# Patient Record
Sex: Female | Born: 1955
Health system: Southern US, Community
[De-identification: ages and names within clinical notes are randomized; demographics above are authoritative.]

## PROBLEM LIST (undated history)

## (undated) DIAGNOSIS — F4310 Post-traumatic stress disorder, unspecified: Secondary | ICD-10-CM

## (undated) DIAGNOSIS — Z8742 Personal history of other diseases of the female genital tract: Secondary | ICD-10-CM

## (undated) DIAGNOSIS — C801 Malignant (primary) neoplasm, unspecified: Secondary | ICD-10-CM

## (undated) DIAGNOSIS — F32A Depression, unspecified: Secondary | ICD-10-CM

## (undated) DIAGNOSIS — T4145XA Adverse effect of unspecified anesthetic, initial encounter: Secondary | ICD-10-CM

## (undated) DIAGNOSIS — E079 Disorder of thyroid, unspecified: Secondary | ICD-10-CM

## (undated) DIAGNOSIS — IMO0001 Reserved for inherently not codable concepts without codable children: Secondary | ICD-10-CM

## (undated) DIAGNOSIS — T8859XA Other complications of anesthesia, initial encounter: Secondary | ICD-10-CM

## (undated) DIAGNOSIS — I10 Essential (primary) hypertension: Secondary | ICD-10-CM

## (undated) DIAGNOSIS — K219 Gastro-esophageal reflux disease without esophagitis: Secondary | ICD-10-CM

## (undated) DIAGNOSIS — M1990 Unspecified osteoarthritis, unspecified site: Secondary | ICD-10-CM

## (undated) DIAGNOSIS — N840 Polyp of corpus uteri: Secondary | ICD-10-CM

## (undated) DIAGNOSIS — E785 Hyperlipidemia, unspecified: Secondary | ICD-10-CM

## (undated) DIAGNOSIS — F419 Anxiety disorder, unspecified: Secondary | ICD-10-CM

## (undated) DIAGNOSIS — F329 Major depressive disorder, single episode, unspecified: Secondary | ICD-10-CM

## (undated) DIAGNOSIS — M8580 Other specified disorders of bone density and structure, unspecified site: Secondary | ICD-10-CM

## (undated) HISTORY — DX: Post-traumatic stress disorder, unspecified: F43.10

## (undated) HISTORY — PX: COLONOSCOPY: SHX174

## (undated) HISTORY — DX: Major depressive disorder, single episode, unspecified: F32.9

## (undated) HISTORY — DX: Malignant (primary) neoplasm, unspecified: C80.1

## (undated) HISTORY — DX: Disorder of thyroid, unspecified: E07.9

## (undated) HISTORY — DX: Depression, unspecified: F32.A

## (undated) HISTORY — PX: TUBAL LIGATION: SHX77

## (undated) HISTORY — DX: Anxiety disorder, unspecified: F41.9

## (undated) HISTORY — DX: Essential (primary) hypertension: I10

## (undated) HISTORY — DX: Other specified disorders of bone density and structure, unspecified site: M85.80

## (undated) HISTORY — DX: Polyp of corpus uteri: N84.0

## (undated) HISTORY — DX: Unspecified osteoarthritis, unspecified site: M19.90

## (undated) HISTORY — DX: Personal history of other diseases of the female genital tract: Z87.42

---

## 1998-04-03 ENCOUNTER — Other Ambulatory Visit: Admission: RE | Admit: 1998-04-03 | Discharge: 1998-04-03 | Payer: Self-pay | Admitting: Obstetrics and Gynecology

## 1998-06-03 HISTORY — PX: THYROIDECTOMY: SHX17

## 1999-02-15 ENCOUNTER — Ambulatory Visit (HOSPITAL_COMMUNITY): Admission: RE | Admit: 1999-02-15 | Discharge: 1999-02-15 | Payer: Self-pay

## 2000-01-28 ENCOUNTER — Other Ambulatory Visit: Admission: RE | Admit: 2000-01-28 | Discharge: 2000-01-28 | Payer: Self-pay | Admitting: Obstetrics and Gynecology

## 2001-07-23 ENCOUNTER — Other Ambulatory Visit: Admission: RE | Admit: 2001-07-23 | Discharge: 2001-07-23 | Payer: Self-pay | Admitting: Obstetrics and Gynecology

## 2001-10-09 ENCOUNTER — Emergency Department (HOSPITAL_COMMUNITY): Admission: EM | Admit: 2001-10-09 | Discharge: 2001-10-09 | Payer: Self-pay | Admitting: Emergency Medicine

## 2001-10-09 ENCOUNTER — Encounter: Payer: Self-pay | Admitting: Emergency Medicine

## 2003-05-31 ENCOUNTER — Other Ambulatory Visit: Admission: RE | Admit: 2003-05-31 | Discharge: 2003-05-31 | Payer: Self-pay | Admitting: Obstetrics and Gynecology

## 2003-06-16 ENCOUNTER — Encounter: Admission: RE | Admit: 2003-06-16 | Discharge: 2003-06-16 | Payer: Self-pay | Admitting: Endocrinology

## 2003-10-31 ENCOUNTER — Inpatient Hospital Stay (HOSPITAL_COMMUNITY): Admission: AD | Admit: 2003-10-31 | Discharge: 2003-11-03 | Payer: Self-pay | Admitting: Psychiatry

## 2003-10-31 ENCOUNTER — Emergency Department (HOSPITAL_COMMUNITY): Admission: EM | Admit: 2003-10-31 | Discharge: 2003-10-31 | Payer: Self-pay | Admitting: Emergency Medicine

## 2004-06-01 ENCOUNTER — Other Ambulatory Visit: Admission: RE | Admit: 2004-06-01 | Discharge: 2004-06-01 | Payer: Self-pay | Admitting: Obstetrics and Gynecology

## 2005-06-12 ENCOUNTER — Other Ambulatory Visit: Admission: RE | Admit: 2005-06-12 | Discharge: 2005-06-12 | Payer: Self-pay | Admitting: Obstetrics and Gynecology

## 2005-10-01 ENCOUNTER — Ambulatory Visit: Payer: Self-pay | Admitting: Internal Medicine

## 2005-11-14 ENCOUNTER — Ambulatory Visit: Payer: Self-pay | Admitting: Internal Medicine

## 2006-03-08 ENCOUNTER — Encounter: Admission: RE | Admit: 2006-03-08 | Discharge: 2006-03-08 | Payer: Self-pay | Admitting: Sports Medicine

## 2006-04-07 ENCOUNTER — Ambulatory Visit: Payer: Self-pay | Admitting: Internal Medicine

## 2006-07-17 ENCOUNTER — Encounter (INDEPENDENT_AMBULATORY_CARE_PROVIDER_SITE_OTHER): Payer: Self-pay | Admitting: Specialist

## 2006-07-17 ENCOUNTER — Ambulatory Visit (HOSPITAL_BASED_OUTPATIENT_CLINIC_OR_DEPARTMENT_OTHER): Admission: RE | Admit: 2006-07-17 | Discharge: 2006-07-17 | Payer: Self-pay | Admitting: Obstetrics and Gynecology

## 2006-07-17 HISTORY — PX: DIAGNOSTIC LAPAROSCOPY: SUR761

## 2006-07-23 ENCOUNTER — Other Ambulatory Visit: Admission: RE | Admit: 2006-07-23 | Discharge: 2006-07-23 | Payer: Self-pay | Admitting: Obstetrics and Gynecology

## 2007-01-14 ENCOUNTER — Observation Stay (HOSPITAL_COMMUNITY): Admission: EM | Admit: 2007-01-14 | Discharge: 2007-01-15 | Payer: Self-pay | Admitting: Emergency Medicine

## 2007-01-31 ENCOUNTER — Emergency Department (HOSPITAL_COMMUNITY): Admission: EM | Admit: 2007-01-31 | Discharge: 2007-01-31 | Payer: Self-pay | Admitting: Emergency Medicine

## 2007-02-03 ENCOUNTER — Ambulatory Visit (HOSPITAL_COMMUNITY): Admission: RE | Admit: 2007-02-03 | Discharge: 2007-02-03 | Payer: Self-pay | Admitting: Emergency Medicine

## 2007-02-07 ENCOUNTER — Emergency Department (HOSPITAL_COMMUNITY): Admission: EM | Admit: 2007-02-07 | Discharge: 2007-02-07 | Payer: Self-pay | Admitting: Emergency Medicine

## 2007-02-20 ENCOUNTER — Encounter (HOSPITAL_COMMUNITY): Admission: RE | Admit: 2007-02-20 | Discharge: 2007-05-06 | Payer: Self-pay | Admitting: Emergency Medicine

## 2008-02-24 ENCOUNTER — Ambulatory Visit: Payer: Self-pay | Admitting: Obstetrics and Gynecology

## 2008-02-25 ENCOUNTER — Ambulatory Visit: Payer: Self-pay | Admitting: Obstetrics and Gynecology

## 2008-03-17 ENCOUNTER — Other Ambulatory Visit: Admission: RE | Admit: 2008-03-17 | Discharge: 2008-03-17 | Payer: Self-pay | Admitting: Obstetrics and Gynecology

## 2008-03-17 ENCOUNTER — Ambulatory Visit: Payer: Self-pay | Admitting: Obstetrics and Gynecology

## 2008-03-17 ENCOUNTER — Encounter: Payer: Self-pay | Admitting: Obstetrics and Gynecology

## 2008-05-10 ENCOUNTER — Ambulatory Visit: Payer: Self-pay | Admitting: Obstetrics and Gynecology

## 2009-03-22 ENCOUNTER — Ambulatory Visit: Payer: Self-pay | Admitting: Obstetrics and Gynecology

## 2009-03-22 ENCOUNTER — Other Ambulatory Visit: Admission: RE | Admit: 2009-03-22 | Discharge: 2009-03-22 | Payer: Self-pay | Admitting: Obstetrics and Gynecology

## 2009-03-22 ENCOUNTER — Encounter: Payer: Self-pay | Admitting: Obstetrics and Gynecology

## 2009-04-11 ENCOUNTER — Ambulatory Visit: Payer: Self-pay | Admitting: Obstetrics and Gynecology

## 2009-06-14 ENCOUNTER — Ambulatory Visit: Payer: Self-pay | Admitting: Vascular Surgery

## 2009-06-14 ENCOUNTER — Encounter (INDEPENDENT_AMBULATORY_CARE_PROVIDER_SITE_OTHER): Payer: Self-pay | Admitting: Emergency Medicine

## 2009-06-14 ENCOUNTER — Emergency Department (HOSPITAL_COMMUNITY): Admission: EM | Admit: 2009-06-14 | Discharge: 2009-06-14 | Payer: Self-pay | Admitting: Emergency Medicine

## 2009-10-06 ENCOUNTER — Ambulatory Visit: Payer: Self-pay | Admitting: Obstetrics and Gynecology

## 2010-03-16 ENCOUNTER — Emergency Department (HOSPITAL_COMMUNITY): Admission: EM | Admit: 2010-03-16 | Discharge: 2010-03-16 | Payer: Self-pay | Admitting: Emergency Medicine

## 2010-04-30 ENCOUNTER — Ambulatory Visit: Payer: Self-pay | Admitting: Obstetrics and Gynecology

## 2010-04-30 ENCOUNTER — Other Ambulatory Visit: Admission: RE | Admit: 2010-04-30 | Discharge: 2010-04-30 | Payer: Self-pay | Admitting: Obstetrics and Gynecology

## 2010-05-10 ENCOUNTER — Ambulatory Visit: Payer: Self-pay | Admitting: Obstetrics and Gynecology

## 2010-08-06 ENCOUNTER — Ambulatory Visit (INDEPENDENT_AMBULATORY_CARE_PROVIDER_SITE_OTHER): Payer: PRIVATE HEALTH INSURANCE | Admitting: Obstetrics and Gynecology

## 2010-08-06 DIAGNOSIS — Z113 Encounter for screening for infections with a predominantly sexual mode of transmission: Secondary | ICD-10-CM

## 2010-08-06 DIAGNOSIS — Z7189 Other specified counseling: Secondary | ICD-10-CM

## 2010-08-16 ENCOUNTER — Emergency Department (HOSPITAL_COMMUNITY)
Admission: EM | Admit: 2010-08-16 | Discharge: 2010-08-16 | Disposition: A | Discharge status: Home / Self Care | Payer: PRIVATE HEALTH INSURANCE | Attending: Emergency Medicine | Admitting: Emergency Medicine

## 2010-08-16 ENCOUNTER — Emergency Department (HOSPITAL_COMMUNITY): Payer: PRIVATE HEALTH INSURANCE

## 2010-08-16 DIAGNOSIS — Z79899 Other long term (current) drug therapy: Secondary | ICD-10-CM | POA: Insufficient documentation

## 2010-08-16 DIAGNOSIS — E785 Hyperlipidemia, unspecified: Secondary | ICD-10-CM | POA: Insufficient documentation

## 2010-08-16 DIAGNOSIS — E119 Type 2 diabetes mellitus without complications: Secondary | ICD-10-CM | POA: Insufficient documentation

## 2010-08-16 DIAGNOSIS — R079 Chest pain, unspecified: Secondary | ICD-10-CM | POA: Insufficient documentation

## 2010-08-16 DIAGNOSIS — I10 Essential (primary) hypertension: Secondary | ICD-10-CM | POA: Insufficient documentation

## 2010-08-16 DIAGNOSIS — R112 Nausea with vomiting, unspecified: Secondary | ICD-10-CM | POA: Insufficient documentation

## 2010-08-16 DIAGNOSIS — Z7982 Long term (current) use of aspirin: Secondary | ICD-10-CM | POA: Insufficient documentation

## 2010-08-16 DIAGNOSIS — E039 Hypothyroidism, unspecified: Secondary | ICD-10-CM | POA: Insufficient documentation

## 2010-08-16 DIAGNOSIS — F341 Dysthymic disorder: Secondary | ICD-10-CM | POA: Insufficient documentation

## 2010-08-16 LAB — URINALYSIS, ROUTINE W REFLEX MICROSCOPIC
Ketones, ur: NEGATIVE mg/dL
Nitrite: NEGATIVE
Protein, ur: NEGATIVE mg/dL
Urobilinogen, UA: 0.2 mg/dL (ref 0.0–1.0)
pH: 7 (ref 5.0–8.0)

## 2010-08-16 LAB — DIFFERENTIAL
Basophils Absolute: 0.1 10*3/uL (ref 0.0–0.1)
Basophils Relative: 1 % (ref 0–1)
Eosinophils Relative: 2 % (ref 0–5)
Monocytes Absolute: 0.4 10*3/uL (ref 0.1–1.0)

## 2010-08-16 LAB — POCT I-STAT, CHEM 8
Calcium, Ion: 0.85 mmol/L — ABNORMAL LOW (ref 1.12–1.32)
Glucose, Bld: 301 mg/dL — ABNORMAL HIGH (ref 70–99)
HCT: 39 % (ref 36.0–46.0)
Hemoglobin: 13.3 g/dL (ref 12.0–15.0)

## 2010-08-16 LAB — CBC
HCT: 39.3 % (ref 36.0–46.0)
MCHC: 32.6 g/dL (ref 30.0–36.0)
RDW: 16.3 % — ABNORMAL HIGH (ref 11.5–15.5)

## 2010-08-16 LAB — POCT CARDIAC MARKERS: Myoglobin, poc: 95.9 ng/mL (ref 12–200)

## 2010-08-16 LAB — COMPREHENSIVE METABOLIC PANEL
ALT: 17 U/L (ref 0–35)
Alkaline Phosphatase: 110 U/L (ref 39–117)
CO2: 24 mEq/L (ref 19–32)
Calcium: 8.8 mg/dL (ref 8.4–10.5)
Chloride: 106 mEq/L (ref 96–112)
GFR calc non Af Amer: >60 mL/min (ref >60)
Glucose, Bld: 113 mg/dL — ABNORMAL HIGH (ref 70–99)
Sodium: 140 mEq/L (ref 135–145)
Total Bilirubin: 0.5 mg/dL (ref 0.3–1.2)

## 2010-08-16 LAB — GLUCOSE, CAPILLARY: Glucose-Capillary: 317 mg/dL — ABNORMAL HIGH (ref 70–99)

## 2010-08-17 LAB — URINE CULTURE
Colony Count: NO GROWTH
Culture  Setup Time: 201203151144
Culture: NO GROWTH

## 2010-09-20 ENCOUNTER — Institutional Professional Consult (permissible substitution): Payer: PRIVATE HEALTH INSURANCE | Admitting: Family Medicine

## 2010-09-25 ENCOUNTER — Other Ambulatory Visit (INDEPENDENT_AMBULATORY_CARE_PROVIDER_SITE_OTHER): Payer: BC Managed Care – PPO

## 2010-09-25 DIAGNOSIS — N912 Amenorrhea, unspecified: Secondary | ICD-10-CM

## 2010-10-16 NOTE — H&P (Signed)
NAME:  Virginia Romero, Virginia Romero NO.:  [id number]   MEDICAL RECORD NO.:  [id number]          PATIENT TYPE:  EMS   LOCATION:  MAJO                         FACILITY:  [facility]   PHYSICIAN:  Cassell Clement, M.D. DATE OF BIRTH:  02-15-1956   DATE OF ADMISSION:  01/14/2007  DATE OF DISCHARGE:                              HISTORY & PHYSICAL   CHIEF COMPLAINT:  Chest pain.   HISTORY OF PRESENT ILLNESS:  This is a 55 year old Caucasian female  admitted with chest pain from the emergency room at [facility].  She  does not have any history of known coronary artery disease.  She had her  first episode of chest discomfort about two weeks ago and at that time  she saw Dr. Dagoberto Ligas in the office who did an EKG which showed no acute  abnormalities and he advised her to increase her Toprol from 150 mg  twice a day to 200 mg twice a day.  She had been on Toprol for high  blood pressure.  She did well after that adjustment in the medicines  until today when preparing to go to work, she developed nausea and  diaphoresis followed by substernal chest pain.  She denies any dyspnea  associated with it but did feel very anxious.  She was able to drive  herself to the emergency room and upon arrival she was given four baby  aspirin and some sublingual nitroglycerin with relief of her pain.   Her initial EKG shows no acute changes of ischemia.  Her initial cardiac  enzymes are normal.  She has had no recurrence of pain while holding in  the emergency room.  She does give a history of occasional chest  tightness if she is climbing stairs or she is carrying heavy loads.  She  does have a past history of hypertension for about five years.   MEDICATIONS:  Her present medications include amlodipine 5 mg daily,  Toprol-XL 200 mg twice a day, Levoxyl 2 mg daily, a diuretic, unknown  type, 1-1/2 tablets a day, Xanax 0.25 mg at bedtime, and recently, two  weeks ago, Dr. Dagoberto Ligas also gave her a trial of  Prevacid 30 mg daily.   FAMILY HISTORY:  The family history reveals that her mother has had  coronary disease and had coronary bypass graft surgery three years ago.  Her maternal grandmother died at age 74 of heart problems and heart  failure.  The patient's father died in a car wreck.   SOCIAL HISTORY:  The social history reveals that the patient is married.  They have two children, ages 67 and 22.  The patient works at [employer].  She does smoke 12 cigarettes a day.  She does not drink  alcohol.   PAST MEDICAL HISTORY:  Her past medical history reveals that she had a  total thyroidectomy done at [facility] in 2000.   ALLERGIES:  She has no known drug allergies.   PAST SURGICAL HISTORY:  The past surgical history besides her  thyroidectomy includes a recent oophorectomy by Dr. Eda Paschal for a  large cyst on  one of her ovaries.  She has also had cesarean sections  times two.   REVIEW OF SYSTEMS:  The remainder of the review of systems is negative  in detail.   PHYSICAL EXAMINATION:  VITAL SIGNS:  On examination weight is 200, blood  pressure is 150/79, and pulse 63.  GENERAL APPEARANCE:  The general appearance reveals a well-developed,  well-nourished, somewhat overweight woman in no acute distress.  SKIN:  Warm and dry.  HEENT:  The pupils are equal and reactive.  There is no xanthelasma.  There is no icterus.  The mouth and pharynx are normal.  Jugular venous  pressure is normal.  Thyroid normal.  Carotids are normal.  CHEST:  The chest is clear.  HEART:  Quiet precordium without murmur, gallop, rub, or click.  ABDOMEN:  The abdomen is soft, nontender, no mass.  EXTREMITIES:  The extremities show no phlebitis or edema.  Pedal pulses  are present.   INVESTIGATIONS:  Her electrocardiogram is normal, normal sinus rhythm at  77, no acute changes.  Her initial CK-MB is less than 1.0, initial  troponin of 0.05.   IMPRESSION:  1. Chest pain, rule out myocardial infarction.  2.  Hypertensive cardiovascular disease.  3. Status post thyroidectomy, on replacement thyroid hormone.  4. Tobacco abuse.  5. Possible gastroesophageal reflux disease.   DISPOSITION:  We are going to admit for observation status.  Will get  serial enzymes.  We will place her on telemetry.  We will continue with  aspirin, beta blocker, Lovenox, and p.r.n. nitroglycerin.  If she  remains stable and with no evidence of acute cardiac injury will  consider outpatient Cardiolite stress testing.           ______________________________  Cassell Clement, M.D.     TB/MEDQ  D:  01/14/2007  T:  01/16/2007  Job:  [id number]   cc:   Cassell Clement, M.D.  Alfonse Alpers. Dagoberto Ligas, M.D.  Daniel L. Eda Paschal, M.D.

## 2010-10-16 NOTE — Discharge Summary (Signed)
NAME:  Virginia Romero, Virginia Romero                ACCOUNT NO.:  [id number]   MEDICAL RECORD NO.:  [id number]          PATIENT TYPE:  INP   LOCATION:  3701                         FACILITY:  [facility]   PHYSICIAN:  Cassell Clement, M.D. DATE OF BIRTH:  1955-08-24   DATE OF ADMISSION:  01/14/2007  DATE OF DISCHARGE:  01/15/2007                               DISCHARGE SUMMARY   FINAL DIAGNOSES:  1. Chest pain, myocardial infarction ruled out.  2. Hyperlipidemia.  3. Essential hypertension.  4. Status post thyroidectomy, on replacement therapy.  5. Tobacco abuse.  6. Possible gastroesophageal reflux disease.   HISTORY:  This 55 year old woman was admitted from the Emergency Room  with chest pain.  She had had one episode several weeks ago and, at that  time, had seen Dr. Dagoberto Ligas, who did an EKG and advised her to increase  her Toprol.  She did well until today when, while preparing to go to  work, she again had chest pain and was advised to come to the Emergency  Room.  With his pain, she did have some diaphoresis and nausea but no  dyspnea.  She drove herself to the Emergency Room.  She was given four  aspirin and one sublingual nitroglycerin with relief of pain.  She had  no further chest pain in the Emergency Room.   Her home medications include Xanax, diuretic, Levoxyl, amlodipine,  Toprol, and Prevacid.   PHYSICAL EXAMINATION:  GENERAL:  Physical examination in the Emergency  Room was unremarkable.  VITAL SIGNS:  Her blood pressure is 153/79 and pulse of 63.  Weight is  in excess of 200 pounds.  LUNGS:  Lungs are clear.  CARDIOVASCULAR:  The heart reveals no gallop.  ABDOMEN:  Negative.  EXTREMITIES:  Negative.   Her chest x-ray was normal.   Her EKG was normal.   Her cardiac enzymes in the Emergency Room were normal.   HOSPITAL COURSE:  She was admitted for observation.  She was placed on  telemetry.  Serial enzymes were obtained and were negative for  myocardial damage.  Repeat EKG  the following morning was again normal,  with no evidence of ischemia.  She had no further chest pain, and it was  felt that she could be reasonably discharged and have an outpatient  Cardiolite.  Her LDL was found to be high at 133, and her HDL was low at  22.  We are going to add statin therapy and we will choose simvastatin  because of cost consideration.   The patient is being discharged on the following regimen:  1. Simvastatin 40 mg each night.  2. Lisinopril 10 mg each morning.  3. Norvasc 5 mg daily.  4. Toprol-XL 100 mg twice a day.  5. Levoxyl 200 mg daily.  6. Aspirin 81 mg daily.  7. Pravastatin as needed.  8. She will continue her diuretic 1 daily.  9. She will have Nitrostat 1/150 on hand for as-needed use.   She is advised to stop smoking.  Our office will contact her to set up a  two-day treadmill Cardiolite stress test.  CONDITION ON DISCHARGE:  Improved.           ______________________________  Cassell Clement, M.D.     TB/MEDQ  D:  01/15/2007  T:  01/16/2007  Job:  [id number]   cc:   Alfonse Alpers. Dagoberto Ligas, M.D.  Daniel L. Eda Paschal, M.D.

## 2010-10-19 NOTE — Op Note (Signed)
NAME:  Virginia Romero, Virginia Romero                ACCOUNT NO.:  [id number]   MEDICAL RECORD NO.:  [id number]          PATIENT TYPE:  AMB   LOCATION:  NESC                         FACILITY:  [facility]   PHYSICIAN:  Daniel L. Gottsegen, M.D.DATE OF BIRTH:  1955/07/17   DATE OF PROCEDURE:  07/17/2006  DATE OF DISCHARGE:                               OPERATIVE REPORT   PREOPERATIVE DIAGNOSES:  1. Left lower quadrant pain.  2. Persistent left ovarian cyst.  3. New onset of right ovarian cyst.  4. Endometrial polyp.   POSTOPERATIVE DIAGNOSES:  1. Bilateral ovarian cysts.  2. Pelvic adhesive disease involving left adnexa.  3. Adhesions involving omentum and left lower quadrant.  4. Endometrial polyp.   OPERATION:  Diagnostic laparoscopy with lysis of peritubular and  periovarian adhesions, lysis of omental adhesions, left salpingo-  oophorectomy, right ovarian cystectomy, hysteroscopy with excision of  endometrial polyp.   INDICATIONS:  The patient is a 55 year old female with a 5-month history  of chronic left lower quadrant pain.  On ultrasound she had a very large  left ovarian cyst.  It has been watch on ultrasound and it has continued  to decrease in size.  It remains complex, however, it has never  completely gone away and even though it is decreased in size, her pain  on the left side has not gone away at all.  Her most recent ultrasound  also showed a new cyst on her right ovary.  She also has an endometrial  polyp seen on ultrasound, although she has had no menorrhagia.  She now  enters the hospital for evaluation of her left lower quadrant pain.  She  will undergo a left salpingo-oophorectomy if appropriate as well as  other appropriate surgery.  I have told her if we need to remove the  cyst on her right ovary, we will do so as well.  We will then do a  hysteroscopy and remove her endometrial polyp.   FINDINGS:  At the time of surgery, the patient had some omental  adhesions to the  parietal peritoneum on the left side which I think at  least partially explained her pain.  The left ovary and tube were  densely adherent, both posteriorly to the broad ligament and to the  uterus.  It gave an appearance of endometriosis, although no actual  endometriosis could be seen.  There was a cyst on the left ovary that  was part of the left ovarian mass.  On the patient's right side she had  an ovarian cyst that looked functional that was on one pole of the right  ovary.  Other than this, there were no other abnormal findings in the  pelvis, although she did have some adhesions at the site of her previous  cesarean section.  At the time of hysteroscopy, the patient had a very  distinct endometrial polyp coming off the anterior wall of the fundus of  about 1.5 cm.   PROCEDURE:  After adequate general orotracheal anesthesia, the patient  was placed in the dorsal lithotomy position, prepped and draped in the  usual sterile  manner.  A Hulka catheter was inserted in the patient's  uterus.  A Foley catheter inserted into the bladder, using the OptiVu of  which was placed subumbilically, the peritoneal cavity was entered by  direct puncture without any trauma. Two 5 mm ports were placed in the  pelvis.  The laparoscope in the subumbilical position was the diagnostic  scope and it was attached to a camera for magnification.  First  adhesions were taken down with the unipolar scissors.  The ovary and  tube were freed up as well as the omental adhesions and then it was  decided, because of the dense adhesive disease, to remove the left ovary  and tube.  It almost gave the appearance as if the patient had  endometriosis, although I did not really actually see any true  endometriosis.  The ureter was identified.  The IP ligament was  bipolared and cut.  The rest of the attachments of the left ovary and  tube were bipolar ed and cut and the specimen was now free.  Attention  was turned to  the cyst on the right ovary.  It was sharply dissected  free with a scissors using unipolar current.  There was a little  bleeding from the base when it was freed up from the ovary and this was  taken care of with bipolar and at the termination of procedure, there  was absolutely no bleeding.  A 5 mm scope was then placed in the right  lower quadrant.  An Endopouch was placed subumbilically and the left  ovary and tube and the right ovarian cyst were removed in this fashion  and they were sent to pathology separately.  The subumbilical incision  was closed with interrupteds of 0 Vicryl and the skin incisions were  closed with Dermabond.  Attention was next turned hysteroscopically.  A  single-tooth sac was placed in the anterior lip of the cervix.  The  cervix was dilated to #[facility] dilator.  The hysteroscopic resectoscope  was introduced, 3% sorbitol was used to expand the intrauterine cavity.  A camera once again was used for magnification and using a 90 degree  wire loop with appropriate Bovie settings, the endometrial polyp was  completely removed and sent to pathology for tissue diagnosis along with  endometrial curettings that were obtained afterwards.  The site was  inspected.  There was no bleeding.  Fluid deficit was minimal.  The  patient tolerated the procedure well and left the operating room in  satisfactory condition, draining clear urine from her Foley catheter  which was then removed.      Daniel L. Eda Paschal, M.D.  Electronically Signed     DLG/MEDQ  D:  07/17/2006  T:  07/17/2006  Job:  [id number]
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
## 2010-11-30 ENCOUNTER — Ambulatory Visit (INDEPENDENT_AMBULATORY_CARE_PROVIDER_SITE_OTHER): Payer: BC Managed Care – PPO | Admitting: Women's Health

## 2010-11-30 DIAGNOSIS — R82998 Other abnormal findings in urine: Secondary | ICD-10-CM

## 2010-11-30 DIAGNOSIS — Z113 Encounter for screening for infections with a predominantly sexual mode of transmission: Secondary | ICD-10-CM

## 2010-11-30 DIAGNOSIS — B3731 Acute candidiasis of vulva and vagina: Secondary | ICD-10-CM

## 2010-11-30 DIAGNOSIS — B373 Candidiasis of vulva and vagina: Secondary | ICD-10-CM

## 2010-11-30 DIAGNOSIS — R350 Frequency of micturition: Secondary | ICD-10-CM

## 2010-11-30 DIAGNOSIS — N949 Unspecified condition associated with female genital organs and menstrual cycle: Secondary | ICD-10-CM

## 2010-12-17 ENCOUNTER — Ambulatory Visit (INDEPENDENT_AMBULATORY_CARE_PROVIDER_SITE_OTHER): Payer: BC Managed Care – PPO | Admitting: Women's Health

## 2010-12-17 ENCOUNTER — Ambulatory Visit: Payer: BC Managed Care – PPO | Admitting: Women's Health

## 2010-12-17 ENCOUNTER — Other Ambulatory Visit: Payer: BC Managed Care – PPO

## 2010-12-17 DIAGNOSIS — N8000 Endometriosis of the uterus, unspecified: Secondary | ICD-10-CM

## 2010-12-17 DIAGNOSIS — N8300 Follicular cyst of ovary, unspecified side: Secondary | ICD-10-CM

## 2010-12-17 DIAGNOSIS — N949 Unspecified condition associated with female genital organs and menstrual cycle: Secondary | ICD-10-CM

## 2011-01-21 ENCOUNTER — Ambulatory Visit (INDEPENDENT_AMBULATORY_CARE_PROVIDER_SITE_OTHER): Payer: BC Managed Care – PPO | Admitting: Women's Health

## 2011-01-21 DIAGNOSIS — N912 Amenorrhea, unspecified: Secondary | ICD-10-CM

## 2011-03-18 LAB — DIFFERENTIAL
Basophils Absolute: 0.1
Basophils Relative: 1
Eosinophils Relative: 4
Lymphocytes Relative: 22
Monocytes Absolute: 0.6

## 2011-03-18 LAB — CBC
Hemoglobin: 11.7 — ABNORMAL LOW
Hemoglobin: 12.3
MCHC: 33.6
MCHC: 33.9
Platelets: 321
RBC: 4.16
RBC: 4.37
RDW: 15.6 — ABNORMAL HIGH
RDW: 15.6 — ABNORMAL HIGH
RDW: 15.9 — ABNORMAL HIGH

## 2011-03-18 LAB — LIPID PANEL
LDL Cholesterol: 133 — ABNORMAL HIGH
Total CHOL/HDL Ratio: 8.7
Triglycerides: 187 — ABNORMAL HIGH
VLDL: 37

## 2011-03-18 LAB — I-STAT 8, (EC8 V) (CONVERTED LAB)
BUN: 8
Glucose, Bld: 100 — ABNORMAL HIGH
HCT: 39
Hemoglobin: 13.3
Operator id: 285491
Potassium: 3.7
Sodium: 137

## 2011-03-18 LAB — PROTIME-INR
INR: 1
Prothrombin Time: 13

## 2011-03-18 LAB — CK TOTAL AND CKMB (NOT AT ARMC)
CK, MB: 1
Relative Index: INVALID
Total CK: 76

## 2011-03-18 LAB — APTT: aPTT: 29

## 2011-03-18 LAB — TSH: TSH: 1.496

## 2011-03-18 LAB — POCT CARDIAC MARKERS: Operator id: 285491

## 2011-03-18 LAB — TROPONIN I: Troponin I: 0.01

## 2011-05-20 ENCOUNTER — Ambulatory Visit: Payer: Self-pay | Admitting: Internal Medicine

## 2011-06-13 ENCOUNTER — Telehealth: Payer: Self-pay | Admitting: *Deleted

## 2011-06-13 NOTE — Telephone Encounter (Signed)
Patient called for uti meds.  Informed patient needs ov.

## 2011-06-17 ENCOUNTER — Ambulatory Visit: Payer: Self-pay | Admitting: Internal Medicine

## 2011-06-27 ENCOUNTER — Encounter: Payer: Self-pay | Admitting: Women's Health

## 2011-06-27 ENCOUNTER — Ambulatory Visit (INDEPENDENT_AMBULATORY_CARE_PROVIDER_SITE_OTHER): Payer: BC Managed Care – PPO | Admitting: Women's Health

## 2011-06-27 ENCOUNTER — Other Ambulatory Visit (HOSPITAL_COMMUNITY)
Admission: RE | Admit: 2011-06-27 | Discharge: 2011-06-27 | Disposition: A | Discharge status: Home / Self Care | Payer: BC Managed Care – PPO | Source: Ambulatory Visit | Attending: Obstetrics and Gynecology | Admitting: Obstetrics and Gynecology

## 2011-06-27 ENCOUNTER — Other Ambulatory Visit: Payer: Self-pay | Admitting: Women's Health

## 2011-06-27 VITALS — BP 148/86 | Ht 62.25 in

## 2011-06-27 DIAGNOSIS — Z01419 Encounter for gynecological examination (general) (routine) without abnormal findings: Secondary | ICD-10-CM | POA: Insufficient documentation

## 2011-06-27 DIAGNOSIS — R350 Frequency of micturition: Secondary | ICD-10-CM

## 2011-06-27 DIAGNOSIS — IMO0001 Reserved for inherently not codable concepts without codable children: Secondary | ICD-10-CM

## 2011-06-27 LAB — URINALYSIS, MICROSCOPIC ONLY: Crystals: NONE SEEN

## 2011-06-27 LAB — URINALYSIS, ROUTINE W REFLEX MICROSCOPIC
Leukocytes, UA: NEGATIVE
Nitrite: POSITIVE — AB
Specific Gravity, Urine: 1.02 (ref 1.005–1.030)
Urobilinogen, UA: 0.2 mg/dL (ref 0.0–1.0)

## 2011-06-27 MED ORDER — CIPROFLOXACIN HCL 500 MG PO TABS: 500.0000 mg | ORAL_TABLET | Freq: Two times a day (BID) | ORAL | Status: AC

## 2011-06-27 NOTE — Progress Notes (Signed)
Addended by: Venora Maples on: 06/27/2011 02:20 PM   Modules accepted: Orders

## 2011-06-27 NOTE — Progress Notes (Signed)
Addended by: Venora Maples on: 06/27/2011 02:29 PM   Modules accepted: Orders
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
## 2011-06-30 LAB — URINE CULTURE

## 2011-07-01 ENCOUNTER — Telehealth: Payer: Self-pay | Admitting: Women's Health

## 2011-07-01 ENCOUNTER — Telehealth: Payer: Self-pay | Admitting: *Deleted

## 2011-07-01 NOTE — Telephone Encounter (Signed)
Done, note written

## 2011-07-01 NOTE — Telephone Encounter (Signed)
Telephone call for followup, states is still having some frequency and is feeling rundown. Did review urine culture was positive and needs to finish Cipro prescription. Instructed to call if not 100% by the end of the week.

## 2011-07-01 NOTE — Telephone Encounter (Signed)
Pt called back and said that she would like for you to write a note for her to leave work today due to not feeling better from UTI. You spoke with pt this am. Please write note and I will fax to pt job.

## 2011-07-01 NOTE — Telephone Encounter (Signed)
Note faxed to [phone] per pt request.

## 2011-07-15 ENCOUNTER — Ambulatory Visit: Payer: Self-pay | Admitting: Internal Medicine

## 2011-07-26 ENCOUNTER — Other Ambulatory Visit: Payer: Self-pay

## 2011-07-26 NOTE — Telephone Encounter (Signed)
Called in xanax to [facility]. LMOM to cb to advise pt RTC for additional refills. She missed last two appt with Dr. Perrin Maltese

## 2011-08-12 ENCOUNTER — Ambulatory Visit: Payer: Self-pay | Admitting: Internal Medicine

## 2011-08-26 ENCOUNTER — Emergency Department (HOSPITAL_COMMUNITY)
Admission: EM | Admit: 2011-08-26 | Discharge: 2011-08-26 | Disposition: A | Discharge status: Home / Self Care | Payer: BC Managed Care – PPO | Attending: Emergency Medicine | Admitting: Emergency Medicine

## 2011-08-26 ENCOUNTER — Encounter (HOSPITAL_COMMUNITY): Payer: Self-pay | Admitting: Emergency Medicine

## 2011-08-26 DIAGNOSIS — Z85850 Personal history of malignant neoplasm of thyroid: Secondary | ICD-10-CM | POA: Insufficient documentation

## 2011-08-26 DIAGNOSIS — I10 Essential (primary) hypertension: Secondary | ICD-10-CM | POA: Insufficient documentation

## 2011-08-26 DIAGNOSIS — E119 Type 2 diabetes mellitus without complications: Secondary | ICD-10-CM | POA: Insufficient documentation

## 2011-08-26 DIAGNOSIS — R51 Headache: Secondary | ICD-10-CM | POA: Insufficient documentation

## 2011-08-26 DIAGNOSIS — J069 Acute upper respiratory infection, unspecified: Secondary | ICD-10-CM

## 2011-08-26 DIAGNOSIS — F17200 Nicotine dependence, unspecified, uncomplicated: Secondary | ICD-10-CM | POA: Insufficient documentation

## 2011-08-26 MED ORDER — PROMETHAZINE-DM 6.25-15 MG/5ML PO SYRP: 5.0000 mL | ORAL_SOLUTION | Freq: Four times a day (QID) | ORAL | Status: AC | PRN

## 2011-08-26 NOTE — ED Notes (Signed)
Pt here c/o HA worse with cough x 1 week with productive cough with green sputum

## 2011-08-26 NOTE — ED Provider Notes (Signed)
History     CSN: [id number]  Arrival date & time 08/26/11  0904   First MD Initiated Contact with Patient 08/26/11 [phone]      Chief Complaint  Patient presents with  . Headache  . URI    (Consider location/radiation/quality/duration/timing/severity/associated sxs/prior treatment) HPI Ms. Grussing is a 56 yo female with a history of HTN and DM2, who presents to the ED today with complaints of HA, fever, chills, productive cough, and congestion x 4 days.  Pt states this started last Thursday and has been persistent.  Headache is over the front and middle part of her head and is a "sharp pain."  Nothing makes it better or worse.  She has been coughing up copious amounts of green mucous and this has also been draining from her nose.  She has tried theraflu without any relief.  She denies any N/V/D, abdominal pain, SOB or chest pain.  She does not have a HA at this moment.  Past Medical History  Diagnosis Date  . History of ovarian cyst   . Endometrial polyp   . Hypertension   . Cancer     THYROID  . Diabetes mellitus   . Depression     Past Surgical History  Procedure Date  . Cesarean section '79 AND '86    X2  . Thyroidectomy 2000  . Tubal ligation   . Diagnostic laparoscopy 07/17/2006    WITH LYSIS OF PERITUBULAR AND PERIOVARIAN ADHESIONS, LYSIS OF OMENTAL ADHESIONS, LSO, RIGHT  OVARIAN CYSTECTOMY , HYSTEROSCOPY  WITH EXCISION OF ENDOMETRIAL POLYP...    Family History  Problem Relation Age of Onset  . Hypertension Mother   . Heart disease Mother   . Diabetes Brother   . Hypertension Maternal Grandmother   . Heart disease Maternal Grandmother   . Diabetes Maternal Grandfather   . Diabetes Paternal Grandfather     History  Substance Use Topics  . Smoking status: Current Some Day Smoker  . Smokeless tobacco: Never Used  . Alcohol Use: No    OB History    Grav Para Term Preterm Abortions TAB SAB Ect Mult Living   2 2        2       Review of Systems All pertinent  positives and negatives reviewed in the history of present illness  Allergies  Review of patient's allergies indicates no known allergies.  Home Medications   Current Outpatient Rx  Name Route Sig Dispense Refill  . ALPRAZOLAM 0.25 MG PO TABS Oral Take 0.5 mg by mouth at bedtime as needed. For sleep    . LEVOTHYROXINE SODIUM 100 MCG PO TABS Oral Take 100 mcg by mouth daily.    Marland Kitchen METFORMIN HCL 500 MG PO TABS Oral Take 500 mg by mouth 2 (two) times daily with a meal.    . METOPROLOL TARTRATE 100 MG PO TABS Oral Take 100 mg by mouth 2 (two) times daily.      BP 148/77  Pulse 73  Temp(Src) 98.3 F (36.8 C) (Oral)  Resp 18  SpO2 97%  Physical Exam  Constitutional: She is oriented to person, place, and time. She appears well-developed and well-nourished. No distress.  HENT:  Head: Normocephalic and atraumatic.  Right Ear: Hearing, tympanic membrane, external ear and ear canal normal.  Left Ear: Hearing, tympanic membrane, external ear and ear canal normal.  Nose: Nose normal.  Mouth/Throat: Oropharynx is clear and moist. No oropharyngeal exudate.  Eyes: Pupils are equal, round, and reactive to light.  Cardiovascular: Normal rate, regular rhythm, normal heart sounds and intact distal pulses.   No murmur heard. Pulmonary/Chest: Effort normal and breath sounds normal. No respiratory distress. She has no wheezes.  Abdominal: Soft. Bowel sounds are normal. She exhibits no distension. There is no tenderness. There is no rebound.  Neurological: She is alert and oriented to person, place, and time.  Skin: Skin is warm. No rash noted. She is not diaphoretic. No erythema.    ED Course  Procedures (including critical care time)  Labs Reviewed - No data to display No results found.  Pt sen and assessed.  Pt does not want a CXR due to monetary reasons and is just requesting "something for the cough" as "mucinex is too expensive."   MDM  The patient has normal vital signs in the emergency  department.  She advised me that she does not want any further testing as wants a note from with her.  I advised her that without any further testing we cannot definitively diagnose but that we feel she has a viral URI based on her history of present illness and physical exam findings.        Carlyle Dolly, PA-C 08/26/11 [phone]

## 2011-08-26 NOTE — Discharge Instructions (Signed)
You will need to return here as needed for any worsening in your condition. You need to use over the counter decongestant.

## 2011-08-29 NOTE — ED Provider Notes (Signed)
Medical screening examination/treatment/procedure(s) were performed by non-physician practitioner and as supervising physician I was immediately available for consultation/collaboration.   Suzi Roots, MD 08/29/11 1726

## 2011-09-10 ENCOUNTER — Telehealth: Payer: Self-pay

## 2011-09-10 NOTE — Telephone Encounter (Signed)
Pt needs ov before more refills are given.

## 2011-09-10 NOTE — Telephone Encounter (Signed)
Please pull chart and send message to pa pool. Thanks

## 2011-09-10 NOTE — Telephone Encounter (Signed)
Pt is calling in she states its been 7days since her request for her rx for larazapam and she would like to pick up on her way home from work .

## 2011-09-10 NOTE — Telephone Encounter (Signed)
LMOM TO CB 

## 2011-09-10 NOTE — Telephone Encounter (Signed)
.  [facility] Patient called to say that the pharmacy has sent 2 or more requests for refill of her Alprazolam Rx. Pt states that the pharmacy asked her to call us.  Please call patient at [phone].

## 2011-09-12 NOTE — Telephone Encounter (Signed)
Spoke w/pt and she stated she has appt w/ Dr Perrin Maltese on 09/30/11 (verified this in EPIC). Can we Rx her Xanax so she can sleep until then? She stated she takes the blue pills. Called [facility] to verify strength/sig. Pharmacist said she has been Rxd 1 mg, QID and got #100 on 07/26/11. Can we Rx some until she sees Dr Perrin Maltese? We only need to call pt back at work # if there is a problem, otherwise she will check w/pharm later.

## 2011-09-13 MED ORDER — ALPRAZOLAM 1 MG PO TABS: ORAL_TABLET | ORAL | Status: DC

## 2011-09-13 NOTE — Telephone Encounter (Signed)
Xanax refilled until OV with Dr. Perrin Maltese 09/30/11.  Please phone in, thanks.

## 2011-09-13 NOTE — Telephone Encounter (Signed)
Called in Rx and notified pt 

## 2011-09-30 ENCOUNTER — Ambulatory Visit: Payer: Self-pay | Admitting: Internal Medicine

## 2011-10-21 ENCOUNTER — Ambulatory Visit: Payer: Self-pay | Admitting: Internal Medicine

## 2011-10-22 ENCOUNTER — Other Ambulatory Visit: Payer: Self-pay | Admitting: Internal Medicine

## 2011-10-23 ENCOUNTER — Telehealth: Payer: Self-pay

## 2011-10-23 NOTE — Telephone Encounter (Signed)
.  [facility] The patient called because her pharmacy told her she needed to call [facility] to get Rx renewed.  The patient needs one month supply of generic Synthroid 100mg  called in until she can see Dr. Perrin Maltese at her July appointment.  Please call patient at [phone] (w) or [phone] (h)

## 2011-10-24 MED ORDER — LEVOTHYROXINE SODIUM 100 MCG PO TABS: 100.0000 ug | ORAL_TABLET | Freq: Every day | ORAL | Status: DC

## 2011-10-24 NOTE — Telephone Encounter (Signed)
Done and sent in 

## 2011-10-24 NOTE — Telephone Encounter (Signed)
Left message RX sent in to pharmacy and can pick up. Any questions please call [phone]

## 2011-10-24 NOTE — Telephone Encounter (Signed)
Ok to rf til appt in July, Synthroid 

## 2011-11-21 ENCOUNTER — Other Ambulatory Visit: Payer: Self-pay | Admitting: Internal Medicine

## 2011-11-25 ENCOUNTER — Other Ambulatory Visit: Payer: Self-pay | Admitting: Family Medicine

## 2011-11-25 MED ORDER — FUROSEMIDE 40 MG PO TABS: 80.0000 mg | ORAL_TABLET | Freq: Every day | ORAL | Status: DC

## 2011-12-09 ENCOUNTER — Telehealth: Payer: Self-pay

## 2011-12-09 ENCOUNTER — Encounter: Payer: Self-pay | Admitting: Internal Medicine

## 2011-12-09 ENCOUNTER — Ambulatory Visit: Payer: BC Managed Care – PPO | Admitting: Internal Medicine

## 2011-12-09 VITALS — BP 145/80 | HR 63 | Temp 97.8°F | Resp 18 | Ht 63.0 in | Wt 198.4 lb

## 2011-12-09 DIAGNOSIS — F32A Depression, unspecified: Secondary | ICD-10-CM

## 2011-12-09 DIAGNOSIS — E039 Hypothyroidism, unspecified: Secondary | ICD-10-CM

## 2011-12-09 DIAGNOSIS — Z5181 Encounter for therapeutic drug level monitoring: Secondary | ICD-10-CM

## 2011-12-09 DIAGNOSIS — Z79899 Other long term (current) drug therapy: Secondary | ICD-10-CM

## 2011-12-09 DIAGNOSIS — F329 Major depressive disorder, single episode, unspecified: Secondary | ICD-10-CM

## 2011-12-09 DIAGNOSIS — I10 Essential (primary) hypertension: Secondary | ICD-10-CM

## 2011-12-09 DIAGNOSIS — E782 Mixed hyperlipidemia: Secondary | ICD-10-CM

## 2011-12-09 DIAGNOSIS — R609 Edema, unspecified: Secondary | ICD-10-CM

## 2011-12-09 DIAGNOSIS — E119 Type 2 diabetes mellitus without complications: Secondary | ICD-10-CM

## 2011-12-09 DIAGNOSIS — G4700 Insomnia, unspecified: Secondary | ICD-10-CM

## 2011-12-09 LAB — COMPREHENSIVE METABOLIC PANEL
ALT: <8 U/L (ref 0–35)
CO2: 28 mEq/L (ref 19–32)
Chloride: 103 mEq/L (ref 96–112)
Sodium: 140 mEq/L (ref 135–145)
Total Bilirubin: 0.3 mg/dL (ref 0.3–1.2)
Total Protein: 6.5 g/dL (ref 6.0–8.3)

## 2011-12-09 LAB — CBC WITH DIFFERENTIAL/PLATELET
Basophils Absolute: 0 10*3/uL (ref 0.0–0.1)
HCT: 37 % (ref 36.0–46.0)
Lymphocytes Relative: 27 % (ref 12–46)
Monocytes Absolute: 0.3 10*3/uL (ref 0.1–1.0)
Neutro Abs: 4.2 10*3/uL (ref 1.7–7.7)
Platelets: 305 10*3/uL (ref 150–400)
RDW: 15.9 % — ABNORMAL HIGH (ref 11.5–15.5)
WBC: 6.5 10*3/uL (ref 4.0–10.5)

## 2011-12-09 LAB — POCT GLYCOSYLATED HEMOGLOBIN (HGB A1C): Hemoglobin A1C: 5.7

## 2011-12-09 LAB — LIPID PANEL: Total CHOL/HDL Ratio: 7.3 Ratio

## 2011-12-09 LAB — POCT CBG (FASTING - GLUCOSE)-MANUAL ENTRY: Glucose Fasting, POC: 130 mg/dL — AB (ref 70–99)

## 2011-12-09 MED ORDER — FUROSEMIDE 40 MG PO TABS: 40.0000 mg | ORAL_TABLET | Freq: Every day | ORAL | Status: DC

## 2011-12-09 MED ORDER — ALPRAZOLAM 1 MG PO TABS: ORAL_TABLET | ORAL | Status: DC

## 2011-12-09 MED ORDER — LEVOTHYROXINE SODIUM 100 MCG PO TABS: 100.0000 ug | ORAL_TABLET | Freq: Every day | ORAL | Status: DC

## 2011-12-09 NOTE — Progress Notes (Signed)
  Subjective:    Patient ID: Virginia Romero, female    DOB: 08/02/1955, 56 y.o.   MRN: [id number]  HPI Has no problems with meds Thyroid med gone as is lasix and alprazolam. Niddm and htn controlled and has meds. States to busy and too many problems to come in for meds. Last visit 18 mos.  Review of Systems    agrees to cpe Objective:   Physical Exam COR nl 2+_ edema legs   Results for orders placed in visit on 12/09/11  POCT CBG (FASTING - GLUCOSE)-MANUAL ENTRY      Component Value Range   Glucose Fasting, POC 130 (*) 70 - 99 mg/dL  POCT GLYCOSYLATED HEMOGLOBIN (HGB A1C)      Component Value Range   Hemoglobin A1C 5.7         Assessment & Plan:  CPE scheduled  Meds rfed needed OK to RF all meds 6 mos

## 2011-12-09 NOTE — Telephone Encounter (Signed)
PT WAS JUST IN TO SEE DR GUESTS AND WAS NOT GIVEN XANAX RX AND PHARMACY DOES NOT HAVE IT    [facility] [phone]  PT AT WORK TODAY UNTIL 5:00 [phone]  AFTER THAN CAN BE REACHED AT HOME [phone]

## 2011-12-10 ENCOUNTER — Encounter: Payer: Self-pay | Admitting: Radiology

## 2011-12-10 LAB — TSH: TSH: 49.306 u[IU]/mL — ABNORMAL HIGH (ref 0.350–4.500)

## 2011-12-10 NOTE — Telephone Encounter (Signed)
Called Rx into [facility] pharmacist and notified pt

## 2011-12-10 NOTE — Telephone Encounter (Signed)
See office visit from yesterday. Ok to call in rx to pharmacy.

## 2011-12-12 ENCOUNTER — Encounter: Payer: Self-pay | Admitting: *Deleted

## 2011-12-26 ENCOUNTER — Encounter: Payer: Self-pay | Admitting: Obstetrics and Gynecology

## 2012-05-06 ENCOUNTER — Ambulatory Visit (INDEPENDENT_AMBULATORY_CARE_PROVIDER_SITE_OTHER): Payer: BC Managed Care – PPO | Admitting: Women's Health

## 2012-05-06 ENCOUNTER — Encounter: Payer: Self-pay | Admitting: Women's Health

## 2012-05-06 DIAGNOSIS — N390 Urinary tract infection, site not specified: Secondary | ICD-10-CM

## 2012-05-06 DIAGNOSIS — R350 Frequency of micturition: Secondary | ICD-10-CM

## 2012-05-06 LAB — URINALYSIS W MICROSCOPIC + REFLEX CULTURE
Leukocytes, UA: NEGATIVE
Protein, ur: NEGATIVE mg/dL
Urobilinogen, UA: 0.2 mg/dL (ref 0.0–1.0)

## 2012-05-06 MED ORDER — CIPROFLOXACIN HCL 250 MG PO TABS: 250.0000 mg | ORAL_TABLET | Freq: Two times a day (BID) | ORAL | Status: DC

## 2012-05-06 NOTE — Progress Notes (Signed)
Patient ID: Virginia Romero, female   DOB: June 03, 1956, 56 y.o.   MRN: [id number] Presents with the complaint of increased urinary frequency, urgency, pain with urination and low back pain for several days. Denies a fever,  vaginal discharge. Tearful, states depression problems,pain not causing, has an appointment with psychiatrist in 2 days. Denies feelings of harming self.  Exam: No CVAT, back discomfort sacral area. UA: 0 did 2 WBCs, 3-6 rbc's trace blood, negative leukocytes many bacteria.  Probable UTI  Plan: Cipro 250 twice daily for 3 days #6. UTI prevention discussed, urine culture pending. Keep scheduled appointment with psychiatrist. Test of cure UA in 2 weeks/ hematuria,UTI.

## 2012-05-06 NOTE — Patient Instructions (Addendum)
Urinary Tract Infection Urinary tract infections (UTIs) can develop anywhere along your urinary tract. Your urinary tract is your body's drainage system for removing wastes and extra water. Your urinary tract includes two kidneys, two ureters, a bladder, and a urethra. Your kidneys are a pair of bean-shaped organs. Each kidney is about the size of your fist. They are located below your ribs, one on each side of your spine. CAUSES Infections are caused by microbes, which are microscopic organisms, including fungi, viruses, and bacteria. These organisms are so small that they can only be seen through a microscope. Bacteria are the microbes that most commonly cause UTIs. SYMPTOMS  Symptoms of UTIs may vary by age and gender of the patient and by the location of the infection. Symptoms in [place] Romero women typically include a frequent and intense urge to urinate and a painful, burning feeling in the bladder or urethra during urination. Older women and men are more likely to be tired, shaky, and weak and have muscle aches and abdominal pain. A fever may mean the infection is in your kidneys. Other symptoms of a kidney infection include pain in your back or sides below the ribs, nausea, and vomiting. DIAGNOSIS To diagnose a UTI, your caregiver will ask you about your symptoms. Your caregiver also will ask to provide a urine sample. The urine sample will be tested for bacteria and white blood cells. White blood cells are made by your body to help fight infection. TREATMENT  Typically, UTIs can be treated with medication. Because most UTIs are caused by a bacterial infection, they usually can be treated with the use of antibiotics. The choice of antibiotic and length of treatment depend on your symptoms and the type of bacteria causing your infection. HOME CARE INSTRUCTIONS  If you were prescribed antibiotics, take them exactly as your caregiver instructs you. Finish the medication even if you feel better after you  have only taken some of the medication.  Drink enough water and fluids to keep your urine clear or pale yellow.  Avoid caffeine, tea, and carbonated beverages. They tend to irritate your bladder.  Empty your bladder often. Avoid holding urine for long periods of time.  Empty your bladder before and after sexual intercourse.  After a bowel movement, women should cleanse from front to back. Use each tissue only once. SEEK MEDICAL CARE IF:   You have back pain.  You develop a fever.  Your symptoms do not begin to resolve within 3 days. SEEK IMMEDIATE MEDICAL CARE IF:   You have severe back pain or lower abdominal pain.  You develop chills.  You have nausea or vomiting.  You have continued burning or discomfort with urination. MAKE SURE YOU:   Understand these instructions.  Will watch your condition.  Will get help right away if you are not doing well or get worse. Document Released: 02/27/2005 Document Revised: 11/19/2011 Document Reviewed: 06/28/2011 ExitCare Patient Information 2013 [employer].  

## 2012-05-09 LAB — URINE CULTURE

## 2012-05-29 ENCOUNTER — Other Ambulatory Visit: Payer: Self-pay | Admitting: Internal Medicine

## 2012-06-25 ENCOUNTER — Ambulatory Visit (INDEPENDENT_AMBULATORY_CARE_PROVIDER_SITE_OTHER): Payer: BC Managed Care – PPO | Admitting: Women's Health

## 2012-06-25 ENCOUNTER — Encounter: Payer: Self-pay | Admitting: Women's Health

## 2012-06-25 VITALS — BP 136/88

## 2012-06-25 DIAGNOSIS — Z01419 Encounter for gynecological examination (general) (routine) without abnormal findings: Secondary | ICD-10-CM

## 2012-06-25 DIAGNOSIS — F329 Major depressive disorder, single episode, unspecified: Secondary | ICD-10-CM

## 2012-06-25 DIAGNOSIS — E119 Type 2 diabetes mellitus without complications: Secondary | ICD-10-CM

## 2012-06-25 DIAGNOSIS — I152 Hypertension secondary to endocrine disorders: Secondary | ICD-10-CM | POA: Insufficient documentation

## 2012-06-25 DIAGNOSIS — B9689 Other specified bacterial agents as the cause of diseases classified elsewhere: Secondary | ICD-10-CM

## 2012-06-25 DIAGNOSIS — A499 Bacterial infection, unspecified: Secondary | ICD-10-CM

## 2012-06-25 DIAGNOSIS — F411 Generalized anxiety disorder: Secondary | ICD-10-CM | POA: Insufficient documentation

## 2012-06-25 DIAGNOSIS — F341 Dysthymic disorder: Secondary | ICD-10-CM

## 2012-06-25 DIAGNOSIS — N760 Acute vaginitis: Secondary | ICD-10-CM

## 2012-06-25 DIAGNOSIS — R300 Dysuria: Secondary | ICD-10-CM

## 2012-06-25 DIAGNOSIS — I10 Essential (primary) hypertension: Secondary | ICD-10-CM

## 2012-06-25 DIAGNOSIS — F32A Depression, unspecified: Secondary | ICD-10-CM

## 2012-06-25 DIAGNOSIS — Z780 Asymptomatic menopausal state: Secondary | ICD-10-CM

## 2012-06-25 DIAGNOSIS — E039 Hypothyroidism, unspecified: Secondary | ICD-10-CM

## 2012-06-25 LAB — URINALYSIS W MICROSCOPIC + REFLEX CULTURE
Casts: NONE SEEN
Crystals: NONE SEEN
Leukocytes, UA: NEGATIVE
Nitrite: NEGATIVE
Specific Gravity, Urine: 1.015 (ref 1.005–1.030)
Urobilinogen, UA: 0.2 mg/dL (ref 0.0–1.0)
pH: 7 (ref 5.0–8.0)

## 2012-06-25 LAB — WET PREP FOR TRICH, YEAST, CLUE: Yeast Wet Prep HPF POC: NONE SEEN

## 2012-06-25 MED ORDER — METRONIDAZOLE 500 MG PO TABS: 500.0000 mg | ORAL_TABLET | Freq: Two times a day (BID) | ORAL | Status: DC

## 2012-06-25 NOTE — Patient Instructions (Signed)

## 2012-06-25 NOTE — Progress Notes (Signed)
SHAMIR SEDLAR 11/26/1955 [id number]    History:    The patient presents for annual exam.  Postmenopausal on no HRT. History of normal Paps and mammograms. Hypertension/hypothyroid/diabetes - labs and meds primary care. Colonoscopy 2010/ benign polyp. History of anxiety /depression seeing a therapist. Has not had a bone density.   Past medical history, past surgical history, family history and social history were all reviewed and documented in the EPIC chart. Works at [employer]. Numerous financial problems. Ex-husband diagnosed with HIV after relationship ended.   ROS:  A  ROS was performed and pertinent positives and negatives are included in the history.  Exam:  Filed Vitals:   06/25/12 0804  BP: 136/88    General appearance:  Normal Head/Neck:  Normal, without cervical or supraclavicular adenopathy. Thyroid:  Symmetrical, normal in size, without palpable masses or nodularity. Respiratory  Effort:  Normal  Auscultation:  Clear without wheezing or rhonchi Cardiovascular  Auscultation:  Regular rate, without rubs, murmurs or gallops  Edema/varicosities:  Not grossly evident Abdominal  Soft,nontender, without masses, guarding or rebound.  Liver/spleen:  No organomegaly noted  Hernia:  None appreciated  Skin  Inspection:  Grossly normal  Palpation:  Grossly normal Neurologic/psychiatric  Orientation:  Normal with appropriate conversation.  Mood/affect:  Normal  Genitourinary    Breasts: Examined lying and sitting.     Right: Without masses, retractions, discharge or axillary adenopathy.     Left: Without masses, retractions, discharge or axillary adenopathy.   Inguinal/mons:  Normal without inguinal adenopathy  External genitalia:  Normal  BUS/Urethra/Skene's glands:  Normal  Bladder:  Normal  Vagina:  Scant tan discharge   Cervix:  Normal  Uterus:   normal in size, shape and contour.  Midline and mobile  Adnexa/parametria:     Rt: Without masses or  tenderness.   Lt: Without masses or tenderness.  Anus and perineum: Normal  Digital rectal exam: Normal sphincter tone without palpated masses or tenderness  Assessment/Plan:  57 y.o. DW FG2 P2 for annual exam with complaint of low back pain, burning with urination and vaginal discharge.  BV Anxiety/depression-therapist manages Hypertension/hypothyroid/diabetes-lab and meds primary care  Plan: Flagyl 500 twice daily for 7 days #14, alcohol precautions reviewed. Instructed to call if no relief of discharge. Urine culture pending, SBE's, continue annual mammogram, calcium rich diet, vitamin D 2000 daily encouraged. Schedule bone density here. Reviewed importance of regular exercise for general health, mood and bone health. Pap normal 2013, new screening guidelines reviewed. Reviewed importance of condoms if become sexually active.   Harrington Challenger WHNP, 1:08 PM 06/25/2012

## 2012-06-26 LAB — URINE CULTURE: Colony Count: NO GROWTH

## 2012-07-01 ENCOUNTER — Other Ambulatory Visit: Payer: Self-pay | Admitting: Women's Health

## 2012-07-01 DIAGNOSIS — R319 Hematuria, unspecified: Secondary | ICD-10-CM

## 2012-07-07 ENCOUNTER — Other Ambulatory Visit: Payer: BC Managed Care – PPO

## 2012-07-07 DIAGNOSIS — R350 Frequency of micturition: Secondary | ICD-10-CM

## 2012-07-08 LAB — URINALYSIS W MICROSCOPIC + REFLEX CULTURE
Bacteria, UA: NONE SEEN
Bilirubin Urine: NEGATIVE
Hgb urine dipstick: NEGATIVE
Ketones, ur: NEGATIVE mg/dL
Protein, ur: NEGATIVE mg/dL
Urobilinogen, UA: 0.2 mg/dL (ref 0.0–1.0)

## 2012-07-20 ENCOUNTER — Telehealth: Payer: Self-pay | Admitting: *Deleted

## 2012-07-20 NOTE — Telephone Encounter (Signed)
[facility] requesting refill on alprazolam 1mg .  Last fill 05/29/12

## 2012-07-22 ENCOUNTER — Other Ambulatory Visit: Payer: Self-pay | Admitting: Gynecology

## 2012-07-22 DIAGNOSIS — Z780 Asymptomatic menopausal state: Secondary | ICD-10-CM

## 2012-07-22 DIAGNOSIS — Z13820 Encounter for screening for osteoporosis: Secondary | ICD-10-CM

## 2012-07-23 NOTE — Telephone Encounter (Signed)
RTC to discuss RFs of lorazepam

## 2012-07-23 NOTE — Telephone Encounter (Signed)
Pharmacy advised of denial

## 2012-07-28 ENCOUNTER — Ambulatory Visit (INDEPENDENT_AMBULATORY_CARE_PROVIDER_SITE_OTHER): Payer: BC Managed Care – PPO

## 2012-07-28 DIAGNOSIS — Z13820 Encounter for screening for osteoporosis: Secondary | ICD-10-CM

## 2012-07-29 ENCOUNTER — Telehealth: Payer: Self-pay | Admitting: Radiology

## 2012-07-29 NOTE — Telephone Encounter (Signed)
Please advise on refill of Alprazolam Rx faxed from [facility]

## 2012-08-04 NOTE — Telephone Encounter (Signed)
RTC

## 2012-08-04 NOTE — Telephone Encounter (Signed)
Left message to advise

## 2012-08-11 ENCOUNTER — Telehealth: Payer: Self-pay | Admitting: Radiology

## 2012-08-11 NOTE — Telephone Encounter (Signed)
Please advise on Alprazolam renew rc'd fax from [facility]. Dr Perrin Maltese out of town

## 2012-08-11 NOTE — Telephone Encounter (Signed)
Per telephone encounter 07/29/12, Dr Perrin Maltese advised to RTC for refills

## 2012-08-12 NOTE — Telephone Encounter (Signed)
Called her to advise.  

## 2012-08-17 ENCOUNTER — Other Ambulatory Visit: Payer: Self-pay

## 2012-08-17 DIAGNOSIS — G4700 Insomnia, unspecified: Secondary | ICD-10-CM

## 2012-09-24 ENCOUNTER — Other Ambulatory Visit: Payer: Self-pay | Admitting: Physician Assistant

## 2012-09-28 ENCOUNTER — Ambulatory Visit: Payer: BC Managed Care – PPO | Admitting: Internal Medicine

## 2012-09-28 VITALS — BP 152/76 | HR 64 | Temp 98.1°F | Resp 16 | Ht 62.0 in

## 2012-09-28 DIAGNOSIS — I10 Essential (primary) hypertension: Secondary | ICD-10-CM

## 2012-09-28 DIAGNOSIS — R609 Edema, unspecified: Secondary | ICD-10-CM

## 2012-09-28 DIAGNOSIS — G4700 Insomnia, unspecified: Secondary | ICD-10-CM

## 2012-09-28 DIAGNOSIS — F411 Generalized anxiety disorder: Secondary | ICD-10-CM

## 2012-09-28 DIAGNOSIS — Z760 Encounter for issue of repeat prescription: Secondary | ICD-10-CM

## 2012-09-28 MED ORDER — FUROSEMIDE 40 MG PO TABS: 40.0000 mg | ORAL_TABLET | Freq: Every day | ORAL | Status: DC

## 2012-09-28 MED ORDER — ALPRAZOLAM 1 MG PO TABS: ORAL_TABLET | ORAL | Status: AC

## 2012-09-28 NOTE — Progress Notes (Signed)
  Subjective:    Patient ID: Virginia Romero, female    DOB: 01-03-1956, 57 y.o.   MRN: [id number]  HPI Here for medication refill. Normally sees Dr. Perrin Maltese and is being treated for HTN, DM, Anxiety, Hypothyroidism. Ran out of her Lasix and Xanax. Has increased BP and swelling of her lower extremities  since running out of lasix. No chest pain no shortness of breath. Smokes 5 cigarettes a day.   Review of Systems  Cardiovascular: Positive for leg swelling.  All other systems reviewed and are negative.       Objective:   Physical Exam  Nursing note and vitals reviewed. Constitutional: She appears well-developed and well-nourished.  HENT:  Head: Normocephalic and atraumatic.  Right Ear: External ear normal.  Left Ear: External ear normal.  Nose: Nose normal.  Eyes: Conjunctivae and EOM are normal. Pupils are equal, round, and reactive to light.  Neck: Normal range of motion. Neck supple.  Cardiovascular: Normal rate, regular rhythm, normal heart sounds and intact distal pulses.   Pulmonary/Chest: Effort normal and breath sounds normal.  Abdominal: Soft. Bowel sounds are normal.  Musculoskeletal: Normal range of motion. She exhibits edema.  Neurological: She is alert. She has normal reflexes.  Skin: Skin is warm.  Psychiatric: She has a normal mood and affect. Her behavior is normal. Judgment and thought content normal.    BP 152/76      Assessment & Plan:  Peripheral edema and hypertension uncontrolled sdue to out of lasix. Will refill for 90 days. Will refill xanax for 30 days. Pt instructed to followup with her regular medical Dr for follow up and continuation of care.

## 2012-09-28 NOTE — Patient Instructions (Signed)
Take meds as directed. See your primary care dr for continuation of care and followup on you rhtn and diabetes.

## 2012-11-06 ENCOUNTER — Encounter: Payer: BC Managed Care – PPO | Admitting: Family Medicine

## 2012-11-12 ENCOUNTER — Encounter: Payer: Self-pay | Admitting: Family Medicine

## 2012-11-12 ENCOUNTER — Ambulatory Visit (INDEPENDENT_AMBULATORY_CARE_PROVIDER_SITE_OTHER): Payer: BC Managed Care – PPO | Admitting: Family Medicine

## 2012-11-12 VITALS — BP 162/80 | HR 61 | Temp 98.6°F | Resp 16 | Ht 62.5 in | Wt 199.4 lb

## 2012-11-12 DIAGNOSIS — F329 Major depressive disorder, single episode, unspecified: Secondary | ICD-10-CM

## 2012-11-12 DIAGNOSIS — I10 Essential (primary) hypertension: Secondary | ICD-10-CM

## 2012-11-12 DIAGNOSIS — R5381 Other malaise: Secondary | ICD-10-CM

## 2012-11-12 DIAGNOSIS — IMO0001 Reserved for inherently not codable concepts without codable children: Secondary | ICD-10-CM

## 2012-11-12 DIAGNOSIS — E669 Obesity, unspecified: Secondary | ICD-10-CM | POA: Insufficient documentation

## 2012-11-12 DIAGNOSIS — E039 Hypothyroidism, unspecified: Secondary | ICD-10-CM

## 2012-11-12 DIAGNOSIS — E785 Hyperlipidemia, unspecified: Secondary | ICD-10-CM

## 2012-11-12 LAB — LIPID PANEL
HDL: 35 mg/dL — ABNORMAL LOW (ref >39)
LDL Cholesterol: 173 mg/dL — ABNORMAL HIGH (ref 0–99)
Total CHOL/HDL Ratio: 7.4 Ratio
Triglycerides: 257 mg/dL — ABNORMAL HIGH (ref <150)
VLDL: 51 mg/dL — ABNORMAL HIGH (ref 0–40)

## 2012-11-12 LAB — CBC WITH DIFFERENTIAL/PLATELET
Basophils Relative: 1 % (ref 0–1)
Eosinophils Absolute: 0.4 10*3/uL (ref 0.0–0.7)
HCT: 33.8 % — ABNORMAL LOW (ref 36.0–46.0)
Hemoglobin: 11.9 g/dL — ABNORMAL LOW (ref 12.0–15.0)
MCH: 28.8 pg (ref 26.0–34.0)
MCHC: 35.2 g/dL (ref 30.0–36.0)
Monocytes Absolute: 0.5 10*3/uL (ref 0.1–1.0)
Monocytes Relative: 5 % (ref 3–12)
Neutrophils Relative %: 62 % (ref 43–77)

## 2012-11-12 LAB — COMPREHENSIVE METABOLIC PANEL
AST: 11 U/L (ref 0–37)
Albumin: 4.3 g/dL (ref 3.5–5.2)
Alkaline Phosphatase: 97 U/L (ref 39–117)
BUN: 10 mg/dL (ref 6–23)
Potassium: 4 mEq/L (ref 3.5–5.3)

## 2012-11-12 MED ORDER — LEVOTHYROXINE SODIUM 100 MCG PO TABS: 100.0000 ug | ORAL_TABLET | Freq: Every day | ORAL | Status: DC

## 2012-11-12 MED ORDER — ALPRAZOLAM 0.5 MG PO TABS: 0.5000 mg | ORAL_TABLET | Freq: Every evening | ORAL | Status: DC | PRN

## 2012-11-12 MED ORDER — CITALOPRAM HYDROBROMIDE 20 MG PO TABS: ORAL_TABLET | ORAL | Status: DC

## 2012-11-12 MED ORDER — METFORMIN HCL 500 MG PO TABS: ORAL_TABLET | ORAL | Status: DC

## 2012-11-12 MED ORDER — METOPROLOL TARTRATE 100 MG PO TABS: ORAL_TABLET | ORAL | Status: DC

## 2012-11-12 MED ORDER — CITALOPRAM HYDROBROMIDE 40 MG PO TABS: ORAL_TABLET | ORAL | Status: DC

## 2012-11-12 NOTE — Patient Instructions (Addendum)
I have refilled all medications for 6 months. I will see you again in 5 weeks to follow-up on Diabetes/ review labs and see if the Citalopram for Depression is helpful.   How to take Citalopram 20 mg tablets- start with 1/2 tablet every morning until the end of June. Starting July 1st, take 1 whole tablet = 20 mg every morning. You have Alprazolam to take at bedtime for sleep. If you feel that you are feeling a lot worse and have thoughts of self-harm, seek medical attention immediately.

## 2012-11-12 NOTE — Progress Notes (Signed)
Subjective:    Patient ID: Virginia Romero, female    DOB: 1955-10-04, 57 y.o.   MRN: [id number]  HPI  This 57 y.o. Cauc female has Type II DM, HTN, hypothyroidism and major depression. Last   visit for management of chronic illnesses was in April at 57 [facility]; that visit was primarily for  medication refills. She had GYN visit in Feb 2014 but no labs have been drawn pertaining to  chronic diseases since July 2013. Pt had been in counseling for chronic depression and PTSD  but can only afford to do telephone check-in. Her counselor advised prescription medication; pt  has taken different medications in past with Citalopram being most effective. In general, pt c/o   being "run down" and just not "feeling like myself". No FSBS at home due to lack of glucometer.   Patient Active Problem List   Diagnosis Date Noted  . Anxiety and depression 06/25/2012  . Hypertension 06/25/2012  . Diabetes 06/25/2012  . Hypothyroid 06/25/2012    PMHX, Soc Hx and Fam Hx reviewed.   Review of Systems  Constitutional: Positive for activity change and fatigue. Negative for fever, diaphoresis and unexpected weight change.  HENT: Negative.   Eyes: Positive for visual disturbance. Negative for photophobia and pain.  Respiratory: Negative for apnea, cough, chest tightness and shortness of breath.   Cardiovascular: Negative.   Endocrine: Negative for polydipsia, polyphagia and polyuria.  Musculoskeletal: Positive for arthralgias. Negative for myalgias and gait problem.  Neurological: Negative.   Psychiatric/Behavioral: Positive for sleep disturbance and dysphoric mood. Negative for suicidal ideas, confusion, self-injury and agitation. The patient is not nervous/anxious.        Objective:   Physical Exam  Nursing note and vitals reviewed. Constitutional: She is oriented to person, place, and time. She appears well-developed and well-nourished. No distress.  HENT:  Head: Normocephalic and atraumatic.  Right  Ear: External ear normal.  Left Ear: External ear normal.  Nose: Nose normal.  Mouth/Throat: Oropharynx is clear and moist.  Eyes: Conjunctivae and EOM are normal. Pupils are equal, round, and reactive to light. No scleral icterus.  Neck: Normal range of motion. Neck supple. No JVD present. No thyromegaly present.  Cardiovascular: Normal rate, regular rhythm, normal heart sounds and intact distal pulses.  Exam reveals no gallop and no friction rub.   No murmur heard. Pulmonary/Chest: Effort normal and breath sounds normal. No respiratory distress.  Musculoskeletal: Normal range of motion. She exhibits tenderness. She exhibits no edema.  L knee tender at medial aspect along tibial plateau.  Lymphadenopathy:    She has no cervical adenopathy.  Neurological: She is alert and oriented to person, place, and time. No cranial nerve deficit. She exhibits normal muscle tone. Coordination normal.  Skin: Skin is warm and dry. She is not diaphoretic. No erythema. No pallor.  Psychiatric: Her speech is normal. Judgment and thought content normal. Her mood appears not anxious. Her affect is not angry, not labile and not inappropriate. She is slowed. She is not agitated and not withdrawn. Cognition and memory are normal. She exhibits a depressed mood. She is attentive.    Results for orders placed in visit on 11/12/12  POCT GLYCOSYLATED HEMOGLOBIN (HGB A1C)      Result Value Range   Hemoglobin A1C 5.7    GLUCOSE, POCT (MANUAL RESULT ENTRY)      Result Value Range   POC Glucose 84  70 - 99 mg/dl         Assessment &  Plan:  Type II or unspecified type diabetes mellitus without mention of complication, uncontrolled - Plan: Microalbumin, urine, Comprehensive metabolic panel; continue current medication.  RX: Glucometer of choice w/ strips and supplies.  Unspecified hypothyroidism - Plan: TSH, T3, Free  Depression, major - Plan: Vitamin D, 25-hydroxy; pt advised about starting Citalopram 20 mg at 1/2  tab for several weeks then increasing dose to full tablet every AM.  If she has worsening symptoms or thoughts of self-harm, she is encouraged to seek medical care immediately.  Other and unspecified hyperlipidemia - Plan: Lipid panel  HTN, goal below 140/90 - Stable and controlled on current medication.  Plan: Comprehensive metabolic panel  Other malaise and fatigue - Plan: CBC with Differential, Vitamin D, 25-hydroxy.  Advised pt that getting chronic medical conditions under control will result in improved physical, mental and emotional well being.  Meds ordered this encounter  Medications  . DISCONTD: ALPRAZolam (XANAX) 0.5 MG tablet    Sig: Take 0.5 mg by mouth at bedtime as needed for sleep. NEED REFILLS  . DISCONTD: levothyroxine (SYNTHROID, LEVOTHROID) 100 MCG tablet    Sig: Take 100 mcg by mouth daily. NEED REFILLS  . DISCONTD: furosemide (LASIX) 40 MG tablet    Sig: Take 40 mg by mouth daily. NEED REFILLS  . levothyroxine (SYNTHROID, LEVOTHROID) 100 MCG tablet    Sig: Take 1 tablet (100 mcg total) by mouth daily.    Dispense:  30 tablet    Refill:  5  . metoprolol (LOPRESSOR) 100 MG tablet    Sig: TAKE ONE TABLET BY MOUTH TWICE DAILY    Dispense:  60 tablet    Refill:  5  . metFORMIN (GLUCOPHAGE) 500 MG tablet    Sig: TAKE ONE TABLET TWICE DAILY FOR DIABETES    Dispense:  60 tablet    Refill:  5  . ALPRAZolam (XANAX) 0.5 MG tablet    Sig: Take 1 tablet (0.5 mg total) by mouth at bedtime as needed for sleep.    Dispense:  30 tablet    Refill:  1  . DISCONTD: citalopram (CELEXA) 40 MG tablet    Sig: Take 1/2 tablet every AM for 2 weeks then increase to 1 tablet every AM.    Dispense:  30 tablet    Refill:  2  . citalopram (CELEXA) 20 MG tablet    Sig: Take 1/2 tablet every AM for 2 weeks then increase to 1 tablet every AM.    Dispense:  30 tablet    Refill:  2

## 2012-11-13 LAB — MICROALBUMIN, URINE: Microalb, Ur: 0.76 mg/dL (ref 0.00–1.89)

## 2012-11-14 ENCOUNTER — Other Ambulatory Visit: Payer: Self-pay | Admitting: Family Medicine

## 2012-11-14 MED ORDER — LEVOTHYROXINE SODIUM 125 MCG PO TABS: 100.0000 ug | ORAL_TABLET | Freq: Every day | ORAL | Status: DC

## 2012-11-14 NOTE — Progress Notes (Signed)
Quick Note:  Please contact pt and advise that the following labs are abnormal...  Thyroid test- her dose needs to be increased; take 1 1/2 tablets daily. When you pick up next refill, the tablet dose will be increased; go back to taking 1 tablet (125 mcg) daily at that time. Make sure you are taking the thyroid medication by itself. Vitamin D is a little low; get OTC supplement Vitamin D3 2000 IU and take it daily. Try to get 10-15 minutes of sun exposure most days of the week.  A short walk will help you feel better in many ways and help with Diabetes control and weight loss.  Lipid numbers are high. We will discuss medication when you eturn for follow -up in July. Try to eat as healthy as possible. These high numbers could be due,in part, to thyroid disease.  Copy to pt.  ______

## 2012-11-17 ENCOUNTER — Telehealth: Payer: Self-pay

## 2012-11-17 NOTE — Telephone Encounter (Signed)
Message copied by Johnnette Litter on Tue Nov 17, 2012  7:52 PM ------      Message from: Maurice March      Created: Sun Nov 15, 2012  2:51 PM       Please advise pt that she can come to [facility] today to have leg pain evaluated. ------

## 2012-11-17 NOTE — Telephone Encounter (Signed)
See lab. LMOM to CB

## 2012-11-18 NOTE — Telephone Encounter (Signed)
Pt will RTC this week

## 2012-11-19 ENCOUNTER — Encounter: Payer: Self-pay | Admitting: Family Medicine

## 2012-11-19 ENCOUNTER — Ambulatory Visit (INDEPENDENT_AMBULATORY_CARE_PROVIDER_SITE_OTHER): Payer: BC Managed Care – PPO | Admitting: Family Medicine

## 2012-11-19 ENCOUNTER — Ambulatory Visit: Payer: BC Managed Care – PPO

## 2012-11-19 VITALS — BP 172/84 | HR 65 | Temp 98.4°F | Resp 16 | Ht 62.0 in | Wt 202.0 lb

## 2012-11-19 DIAGNOSIS — M25561 Pain in right knee: Secondary | ICD-10-CM

## 2012-11-19 DIAGNOSIS — I10 Essential (primary) hypertension: Secondary | ICD-10-CM

## 2012-11-19 DIAGNOSIS — M179 Osteoarthritis of knee, unspecified: Secondary | ICD-10-CM | POA: Insufficient documentation

## 2012-11-19 DIAGNOSIS — F418 Other specified anxiety disorders: Secondary | ICD-10-CM

## 2012-11-19 DIAGNOSIS — M1710 Unilateral primary osteoarthritis, unspecified knee: Secondary | ICD-10-CM

## 2012-11-19 DIAGNOSIS — M25562 Pain in left knee: Secondary | ICD-10-CM

## 2012-11-19 DIAGNOSIS — M25569 Pain in unspecified knee: Secondary | ICD-10-CM

## 2012-11-19 DIAGNOSIS — F341 Dysthymic disorder: Secondary | ICD-10-CM

## 2012-11-19 MED ORDER — TRAMADOL HCL 50 MG PO TABS: 50.0000 mg | ORAL_TABLET | Freq: Three times a day (TID) | ORAL | Status: DC | PRN

## 2012-11-19 NOTE — Patient Instructions (Addendum)
Degenerative Arthritis You have osteoarthritis. This is the wear and tear arthritis that comes with aging. It is also called degenerative arthritis. This is common in people past middle age. It is caused by stress on the joints. The large weight bearing joints of the lower extremities are most often affected. The knees, hips, back, neck, and hands can become painful, swollen, and stiff. This is the most common type of arthritis. It comes on with age, carrying too much weight, or from an injury. Treatment includes resting the sore joint until the pain and swelling improve. Crutches or a walker may be needed for severe flares. Only take over-the-counter or prescription medicines for pain, discomfort, or fever as directed by your caregiver. Local heat therapy may improve motion. Cortisone shots into the joint are sometimes used to reduce pain and swelling during flares. Osteoarthritis is usually not crippling and progresses slowly. There are things you can do to decrease pain:  Avoid high impact activities.  Exercise regularly.  Low impact exercises such as walking, biking and swimming help to keep the muscles strong and keep normal joint function.  Stretching helps to keep your range of motion.  Lose weight if you are overweight. This reduces joint stress. In severe cases when you have pain at rest or increasing disability, joint surgery may be helpful. See your caregiver for follow-up treatment as recommended.  SEEK IMMEDIATE MEDICAL CARE IF:   You have severe joint pain.  Marked swelling and redness in your joint develops.  You develop a high fever. Document Released: 05/20/2005 Document Revised: 08/12/2011 Document Reviewed: 10/20/2006 [facility] Patient Information 2014 [place], [place].   Keep your next appointment and we will see how this medication works for pain relief.  Re; Disability- you have to get the process started now because it takes many months before you get an initial  decision from the Disability Determination Office.

## 2012-11-19 NOTE — Progress Notes (Signed)
S:   This 57 y.o. Cauc female is here for recheck re: depression and knee/leg pain. She feels better having just started Citalopram 1/2 tablet every morning. She is sleeping better and continues to work. She has no suicidal ideation/ ideas of  self-harm; she reports a suicide attempt ~ 10 years ago. She has promised her son that she will not repeat this action. She continues to touch base w/ her counselor who thinks medication is a good idea.   Main complaint to day is for chronic and increasing leg/knee pain. Pt reports injection of L knee years ago but does not recall xrays. She is still rying to work at [employer] and is having difficulty maintaining current work schedule. Knees are a  little swollen and pt feels pressure behind her L knee. Pt can not tolerate Meloxicam and she has not tried any other OTC analgesics. She has not fallen but there is some sensation of "give way" in L knee. Pt has nocturnal pain which is interrupting  sleep. She is inquiring about the disability process and mentions that her employer will help her short term disability until full disability is granted.  Patient Active Problem List   Diagnosis Date Noted  . Obesity, unspecified 11/12/2012  . Anxiety and depression 06/25/2012  . Hypertension 06/25/2012  . Diabetes 06/25/2012  . Hypothyroid 06/25/2012   PMHx, Soc Hx and Fam Hx reviewed.   ROS: As per HPI.   O: Filed Vitals:   11/19/12 0957  BP: 172/84  Pulse: 65  Temp: 98.4 F (36.9 C)  Resp: 16   GEN: In NAD; WN,WD. SKIN: W&D; no rashes or paloor. COR: RRR. LUNGS: Normal resp rate and effort. MS: Knees- bilateral mild deformity w/ valgus deviation. Palpation at tibial plateau- tender bilaterally.   Gait antalgic.                                                                                           L knee- very small effusion; popliteal space full and tender w/ palpation. Good ROM but full flexion is painful. Patella compression illicits  pain.                                                                                           R knee- no effusion. Good ROM and tenderness w/ patella compression.  NEURO: A&O x 3; CNs intact. Nonfocal.   [facility] reading (PRIMARY) by  Dr. Audria Nine: L knee- moderately severe deg changes w/ decreased joint space (medical > lateral aspect); sub-patella erosions.  R knee- same as L knee but less severe.   A/P: Knee pain, bilateral - Plan: Tramadol 50 mg 1 tablet every 8 hours for pain. Advised pt to get a cane. Advised pt that she will probably have to be referred to [facility].  HTN, goal below 140/90- continue to monitor; no medication change at this time.  Depression with anxiety- slight improvement on low dose Citalopram. Continue w/ increased dose w/in next few weeks.  Disability process explained to pt; she is advised to get the process started by signing specific forms at Disability Determination Office. She will bring in info from her employer about temporary disability.  Follow-up as sch next month.

## 2012-12-08 ENCOUNTER — Telehealth: Payer: Self-pay

## 2012-12-08 NOTE — Telephone Encounter (Signed)
Alprazolam initially prescribed 11/12/12. I prescribed #30  w/ 1 additional refill. Pt should have 1 refill remaining on this medication.

## 2012-12-08 NOTE — Telephone Encounter (Signed)
Pharm reqs RF of alprazolam 1 mg 

## 2012-12-11 ENCOUNTER — Telehealth: Payer: Self-pay | Admitting: Radiology

## 2012-12-11 MED ORDER — ALPRAZOLAM 1 MG PO TABS: ORAL_TABLET | ORAL | Status: DC

## 2012-12-11 NOTE — Telephone Encounter (Signed)
Alprazolam 1 mg  #30 w/ 0 refills phoned to pharmacy.

## 2012-12-11 NOTE — Telephone Encounter (Signed)
Please advise on refill  Request faxed from [facility], Alprazolam 1mg , you recently gave 0.5mg  tablet.  (1mg  from Dr Glenis Smoker in April)

## 2012-12-15 ENCOUNTER — Telehealth: Payer: Self-pay

## 2012-12-15 NOTE — Telephone Encounter (Signed)
PT STATES SERVICES RENDERED ON 11/12/12 SHOULD HAVE BEEN FOR A CPE, HOWEVER A CPE WAS NOT BILLED  AND THE LABS DONE ARE ATTACHED TO A MEDICAL CONDITION. THE PATIENT DID SIGN A CPE FORM AT CHECK IN.  SHOULD WE HAVE BILLED A CPE CODE AND SHOULD THE LABS BE ATTACHED TO A CPE DX CODE?

## 2012-12-15 NOTE — Telephone Encounter (Signed)
Ms. Cimino was a "new pt" to me on 11/12/2012. She had several chronic issues that needed to be dealt with; I felt that a"CPE" was not appropriate and, as outlined in my note, we dealt with multiple chronic issues; thus, the multiple diagnosis codes and associated labs. I explained this to the pt at the outset of the visit that I would not be approaching this visit as a "CPE" because she had multiple complaints and medical problems that needed to be sorted out. She stated she understood that.

## 2012-12-15 NOTE — Telephone Encounter (Signed)
Lm for rtn call 

## 2012-12-17 ENCOUNTER — Ambulatory Visit: Payer: BC Managed Care – PPO | Admitting: Family Medicine

## 2012-12-18 NOTE — Telephone Encounter (Signed)
To you Suzi.

## 2012-12-18 NOTE — Telephone Encounter (Signed)
Please advise if anything else needed. Patient was here for multiple issues/ not CPE

## 2012-12-31 ENCOUNTER — Ambulatory Visit: Payer: BC Managed Care – PPO | Admitting: Family Medicine

## 2013-01-19 ENCOUNTER — Telehealth: Payer: Self-pay

## 2013-01-19 NOTE — Telephone Encounter (Signed)
Pharm requests RF of alprazolam 1 mg. 

## 2013-01-21 ENCOUNTER — Telehealth: Payer: Self-pay

## 2013-01-21 NOTE — Telephone Encounter (Signed)
Pharmacy requesting Alprazolam 1 mg #30

## 2013-01-23 ENCOUNTER — Other Ambulatory Visit: Payer: Self-pay | Admitting: Family Medicine

## 2013-01-23 MED ORDER — ALPRAZOLAM 1 MG PO TABS: ORAL_TABLET | ORAL | Status: DC

## 2013-01-27 ENCOUNTER — Ambulatory Visit: Payer: BC Managed Care – PPO | Admitting: Family Medicine

## 2013-02-06 NOTE — Telephone Encounter (Signed)
Rx sent in 8/23

## 2013-02-24 ENCOUNTER — Ambulatory Visit: Payer: BC Managed Care – PPO | Admitting: Family Medicine

## 2013-03-18 ENCOUNTER — Other Ambulatory Visit: Payer: Self-pay | Admitting: Family Medicine

## 2013-03-19 NOTE — Telephone Encounter (Signed)
Alprazolam refill phoned to pt's pharmacy. 

## 2013-03-19 NOTE — Telephone Encounter (Signed)
Pt has appt sch for 03/26/13.

## 2013-03-26 ENCOUNTER — Ambulatory Visit: Payer: BC Managed Care – PPO | Admitting: Family Medicine

## 2013-04-01 ENCOUNTER — Ambulatory Visit: Payer: BC Managed Care – PPO | Admitting: Family Medicine

## 2013-04-01 ENCOUNTER — Encounter: Payer: Self-pay | Admitting: Family Medicine

## 2013-04-01 VITALS — BP 143/71 | HR 65 | Temp 98.8°F | Resp 16 | Ht 62.5 in | Wt 209.0 lb

## 2013-04-01 DIAGNOSIS — IMO0002 Reserved for concepts with insufficient information to code with codable children: Secondary | ICD-10-CM

## 2013-04-01 DIAGNOSIS — M1710 Unilateral primary osteoarthritis, unspecified knee: Secondary | ICD-10-CM

## 2013-04-01 DIAGNOSIS — F329 Major depressive disorder, single episode, unspecified: Secondary | ICD-10-CM

## 2013-04-01 DIAGNOSIS — M545 Low back pain, unspecified: Secondary | ICD-10-CM

## 2013-04-01 DIAGNOSIS — F32A Depression, unspecified: Secondary | ICD-10-CM

## 2013-04-01 DIAGNOSIS — F341 Dysthymic disorder: Secondary | ICD-10-CM

## 2013-04-01 DIAGNOSIS — M179 Osteoarthritis of knee, unspecified: Secondary | ICD-10-CM

## 2013-04-01 DIAGNOSIS — E039 Hypothyroidism, unspecified: Secondary | ICD-10-CM

## 2013-04-01 MED ORDER — ALPRAZOLAM 1 MG PO TABS: ORAL_TABLET | ORAL | Status: DC

## 2013-04-01 MED ORDER — LEVOTHYROXINE SODIUM 125 MCG PO TABS: 125.0000 ug | ORAL_TABLET | Freq: Every day | ORAL | Status: DC

## 2013-04-01 MED ORDER — CITALOPRAM HYDROBROMIDE 20 MG PO TABS: ORAL_TABLET | ORAL | Status: DC

## 2013-04-01 NOTE — Progress Notes (Signed)
S:  This 57 y.o. Cauc female is here for follow-up for depression/anxiety, hypothyroidism and DJD-knees. She feels much better though she had a "down week" last few weeks w/ death of 2 friends (one of brain cancer and the other from DM complications).  Pt is compliant w/ all chronic medications w/o adverse effects. She had some respiratory symptoms and low-grade fever but is now afebrile x 3 days. She thinks "depression medicine is helping more than any other medication" she has ever been on. Anxiety is a problem; at end of work day, pt feels very anxious and experiences palpitations. Knees pain has resolved but she now has 2-3 weeks of LBP w/ burning in R upper buttock. She denies any radiculopathy into R posterior thigh or gait disturbance. She was concerned about shingles but has not developed a vesicular rash on buttocks.  Patient Active Problem List   Diagnosis Date Noted  . DJD (degenerative joint disease) of knee 11/19/2012  . Obesity, unspecified 11/12/2012  . Anxiety and depression 06/25/2012  . Hypertension 06/25/2012  . Diabetes 06/25/2012  . Hypothyroid 06/25/2012   PMHx, Surg Hx, Soc Hx and Fam Hx reviewed.  ROS: AS per HPI; otherwise noncontributory.  O: Filed Vitals:   04/01/13 1302  BP: 143/71  Pulse: 65  Temp: 98.8 F (37.1 C)  Resp: 16   GEN: In NAD: WN,WD. HENT: [place]/AT; EOMI w/ clear conj/sclerae. EACs/TMs/nasal mucosa normal. Post ph erythematous w/o exudate or lesions. COR: RRR. Normal S1 and S2. LUNGS: CTA; no wheezes or rhonchi. Normal resp rate and effort. BACK: Spine straight; tender lower lumbar area and R SI joint. No muscle atrophy noted. SKIN: W&D; intact w/o erythema or rash in area of question. NEURO: A&O x 3; CNs intact. Nonfocal. PSYCH: Anxious but otherwise pleasant and conversant. Speech and thought pattern normal. Judgement normal.  A/P: Low back pain without sciatica- Weight reduction would help reduce back pain; pt declines xrays today. She has  Tramadol for prn use.  Anxiety and depression- Stable and improved on current medication. Continue citalopram 20 mg daily. May use Alprazolam 1/2 tab at midday prn and continue 1 tab hs for sleep.  Hypothyroid- Continue Levothyroxine 125 mcg daily; pt declines labs today but promises will allow lab draw at next visit.  DJD (degenerative joint disease) of knee- This problem is quiescent at present. Weight loss would reduce the symptoms.  Meds ordered this encounter  Medications  . levothyroxine (SYNTHROID, LEVOTHROID) 125 MCG tablet    Sig: Take 1 tablet (125 mcg total) by mouth daily.    Dispense:  30 tablet    Refill:  5  . citalopram (CELEXA) 20 MG tablet    Sig: TAKE ONE TABLET EVERY MORNING.    Dispense:  30 tablet    Refill:  5  . ALPRAZolam (XANAX) 1 MG tablet    Sig: Take 1/2 tablet by mouth at midday prn; take 1 tablet nightly at bedtime as needed for anxiety/sleep.    Dispense:  45 tablet    Refill:  1

## 2013-04-01 NOTE — Patient Instructions (Signed)
Back Pain, Adult  Back pain is very common. The pain often gets better over time. The cause of back pain is usually not dangerous. Most people can learn to manage their back pain on their own.   HOME CARE    Stay active. Start with short walks on flat ground if you can. Try to walk farther each day.   Do not sit, drive, or stand in one place for more than 30 minutes. Do not stay in bed.   Do not avoid exercise or work. Activity can help your back heal faster.   Be careful when you bend or lift an object. Bend at your knees, keep the object close to you, and do not twist.   Sleep on a firm mattress. Lie on your side, and bend your knees. If you lie on your back, put a pillow under your knees.   Only take medicines as told by your doctor.   Put ice on the injured area.   Put ice in a plastic bag.   Place a towel between your skin and the bag.   Leave the ice on for 15-20 minutes, 3-4 times a day for the first 2 to 3 days. After that, you can switch between ice and heat packs.   Ask your doctor about back exercises or massage.   Avoid feeling anxious or stressed. Find good ways to deal with stress, such as exercise.  GET HELP RIGHT AWAY IF:    Your pain does not go away with rest or medicine.   Your pain does not go away in 1 week.   You have new problems.   You do not feel well.   The pain spreads into your legs.   You cannot control when you poop (bowel movement) or pee (urinate).   Your arms or legs feel weak or lose feeling (numbness).   You feel sick to your stomach (nauseous) or throw up (vomit).   You have belly (abdominal) pain.   You feel like you may pass out (faint).  MAKE SURE YOU:    Understand these instructions.   Will watch your condition.   Will get help right away if you are not doing well or get worse.  Document Released: 11/06/2007 Document Revised: 08/12/2011 Document Reviewed: 10/08/2010  [employer] Patient Information 2014 [employer].

## 2013-04-07 ENCOUNTER — Encounter: Payer: Self-pay | Admitting: Women's Health

## 2013-04-16 ENCOUNTER — Ambulatory Visit: Payer: BC Managed Care – PPO | Admitting: Family Medicine

## 2013-04-16 ENCOUNTER — Telehealth: Payer: Self-pay

## 2013-04-16 VITALS — BP 190/110 | HR 67 | Temp 98.8°F | Resp 18 | Ht 62.0 in | Wt 200.0 lb

## 2013-04-16 DIAGNOSIS — I160 Hypertensive urgency: Secondary | ICD-10-CM

## 2013-04-16 DIAGNOSIS — I10 Essential (primary) hypertension: Secondary | ICD-10-CM

## 2013-04-16 MED ORDER — HYDROCHLOROTHIAZIDE 12.5 MG PO TABS: 12.5000 mg | ORAL_TABLET | Freq: Every day | ORAL | Status: DC

## 2013-04-16 NOTE — Progress Notes (Signed)
EKG read and patient discussed with Ms. Debbra Riding. Agree with assessment and plan of care per her note.

## 2013-04-16 NOTE — Telephone Encounter (Signed)
Patient advised to come in for this, her BP is dangerously high and she needs urgent evaluation, especially since she is not feeling well. Advised her to come to urgent care today, to Dr Audria Nine, [place]

## 2013-04-16 NOTE — Patient Instructions (Signed)
Continue taking your metoprolol twice daily as you have been.  Start taking the HCTZ once daily in the morning.    Check your blood pressure 1-2 times per week.  If you see top numbers >140 or bottom numbers >90 we may need to adjust your medicines further.    Recheck with [place] in about 2 weeks, so week can optimize your medications if need be  Try taking celexa at night instead of the morning, this may reduce fatigue  If any of your symptoms are worsening or your develop new symptoms (like chest pain, shortness of breath, facial droop, etc) please come back in or go the emergency department   Hypertension As your heart beats, it forces blood through your arteries. This force is your blood pressure. If the pressure is too high, it is called hypertension (HTN) or high blood pressure. HTN is dangerous because you may have it and not know it. High blood pressure may mean that your heart has to work harder to pump blood. Your arteries may be narrow or stiff. The extra work puts you at risk for heart disease, stroke, and other problems.  Blood pressure consists of two numbers, a higher number over a lower, 110/72, for example. It is stated as "110 over 72." The ideal is below 120 for the top number (systolic) and under 80 for the bottom (diastolic). Write down your blood pressure today. You should pay close attention to your blood pressure if you have certain conditions such as:  Heart failure.  Prior heart attack.  Diabetes  Chronic kidney disease.  Prior stroke.  Multiple risk factors for heart disease. To see if you have HTN, your blood pressure should be measured while you are seated with your arm held at the level of the heart. It should be measured at least twice. A one-time elevated blood pressure reading (especially in the Emergency Department) does not mean that you need treatment. There may be conditions in which the blood pressure is different between your right and left arms. It is  important to see your caregiver soon for a recheck. Most people have essential hypertension which means that there is not a specific cause. This type of high blood pressure may be lowered by changing lifestyle factors such as:  Stress.  Smoking.  Lack of exercise.  Excessive weight.  Drug/tobacco/alcohol use.  Eating less salt. Most people do not have symptoms from high blood pressure until it has caused damage to the body. Effective treatment can often prevent, delay or reduce that damage. TREATMENT  When a cause has been identified, treatment for high blood pressure is directed at the cause. There are a large number of medications to treat HTN. These fall into several categories, and your caregiver will help you select the medicines that are best for you. Medications may have side effects. You should review side effects with your caregiver. If your blood pressure stays high after you have made lifestyle changes or started on medicines,   Your medication(s) may need to be changed.  Other problems may need to be addressed.  Be certain you understand your prescriptions, and know how and when to take your medicine.  Be sure to follow up with your caregiver within the time frame advised (usually within two weeks) to have your blood pressure rechecked and to review your medications.  If you are taking more than one medicine to lower your blood pressure, make sure you know how and at what times they should be  taken. Taking two medicines at the same time can result in blood pressure that is too low. SEEK IMMEDIATE MEDICAL CARE IF:  You develop a severe headache, blurred or changing vision, or confusion.  You have unusual weakness or numbness, or a faint feeling.  You have severe chest or abdominal pain, vomiting, or breathing problems. MAKE SURE YOU:   Understand these instructions.  Will watch your condition.  Will get help right away if you are not doing well or get  worse. Document Released: 05/20/2005 Document Revised: 08/12/2011 Document Reviewed: 01/08/2008 [facility] Patient Information 2014 [place], [place].

## 2013-04-16 NOTE — Telephone Encounter (Signed)
Pt sees dr Audria Nine for bp. States she takes meds like she should and yesterday the nurse at work took her bp and it was 190 (pt didn't know bottom number). States took later and it was 180 and this morning 170.  Asks if she should double up on her meds. States doesn't feel well and hasnt in some time.  Best: [phone] or [phone]  bf

## 2013-04-16 NOTE — Progress Notes (Signed)
Subjective:    Patient ID: Virginia Romero, female    DOB: 04-06-1956, 57 y.o.   MRN: [id number]  HPI   Virginia Romero is a very pleasant 57 yr old female here with concern for hypertension.  Reports that the last two days her BP has been elevated with systolics 180-190.  A nurse at work has taken her blood pressure for the last two days.  She does not have a home BP cuff and states she cannot afford one.  She has a history of hypertension.  Reports that she has taken metoprolol 100mg  BID for probably 10 yrs.  She does not think she has ever been on another BP medication.  Comorbidities include: DM, hypothyroidism, depression.  She is not on an ACE.  Was started on lasix in April 2014 for lower leg edema, but stopped this because she was not sure how long she should take it.  Reports that she has recently felt fatigued and a little light headed.  Denies HA, dizziness, visual change.  Notes a little tingling in her hands and some left arm pain - but attributes this to lots of lifting at work.  She has no CP, SOB.  She denies facial droop, word finding difficulty, numbness, weakness.   On review of chart, pt's BPs at past appts have been elevated with systolics 150s-180s.    Started celexa about 2 wks ago  Review of Systems  Constitutional: Positive for fatigue. Negative for fever and chills.  Respiratory: Negative for cough, shortness of breath and wheezing.   Cardiovascular: Positive for leg swelling (mild). Negative for chest pain and palpitations.  Gastrointestinal: Negative.   Musculoskeletal: Negative.   Skin: Negative.   Neurological: Negative for dizziness, facial asymmetry, weakness, numbness and headaches.       Objective:   Physical Exam  Vitals reviewed. Constitutional: She is oriented to person, place, and time. She appears well-developed and well-nourished. No distress.  HENT:  Head: Normocephalic and atraumatic.  Eyes: Conjunctivae are normal. No scleral icterus.  Cardiovascular:  Normal rate, regular rhythm and normal heart sounds.   Pulses:      Radial pulses are 2+ on the right side, and 2+ on the left side.  Pulmonary/Chest: Effort normal and breath sounds normal. She has no wheezes. She has no rales.  Neurological: She is alert and oriented to person, place, and time.  Skin: Skin is warm and dry.  Psychiatric: She has a normal mood and affect. Her behavior is normal.    EKG - NSR    Assessment & Plan:  Hypertensive urgency - Plan: EKG 12-Lead, hydrochlorothiazide (HYDRODIURIL) 12.5 MG tablet   Virginia Romero is a very pleasant 57 yr old female here today with uncontrolled hypertension.  BP 190/110 at triage.  Pt is anxious but asymptomatic.  EKG is normal.  On review of chart, pt's BPs often elevated with systolics as high as 180s.  Pt reports being treated with metoprolol only for the last 10 yrs.  Given pt's DM, would like her to be on ACE inhib for renal protection, but will start by adding HCTZ 12.5mg .  Will have pt recheck in 2 wks.  At that time, if still not at goal would consider adding ACE inhib for better BP control and renal protection.  Encourage pt to check home BPs about twice per week.  Discussed RTC/ED precautions.  Will also try dosing celexa qhs, suspect this may be contributing to daytime fatigue.  Pt understands and agrees with this  plan.    Meds ordered this encounter  Medications  . hydrochlorothiazide (HYDRODIURIL) 12.5 MG tablet    Sig: Take 1 tablet (12.5 mg total) by mouth daily.    Dispense:  90 tablet    Refill:  0    Order Specific Question:  Supervising Provider    Answer:  Ethelda Chick [[id number]]    Loleta Dicker MHS, PA-C [facility] 11/14/20146:49 PM

## 2013-05-03 NOTE — Telephone Encounter (Signed)
Pt requesting rx called in for her arthritis,states she is in a lot of pain   Best phone for pt is [phone] or [phone]  Pharmacy [facility]

## 2013-05-03 NOTE — Telephone Encounter (Signed)
Unsure why these messages are linked, bottom message is done, patient did return. Patient requesting meds for her Arthritis. Called her to see what medication has helped in the past, what is she requesting? She states Dr Audria Nine previously offered meds, but she declined, however she has had a very bad weekend. Please advise. Patient aware she will not get a call until tomorrow.

## 2013-05-04 MED ORDER — TRAMADOL HCL 50 MG PO TABS: 50.0000 mg | ORAL_TABLET | Freq: Three times a day (TID) | ORAL | Status: DC | PRN

## 2013-05-04 NOTE — Telephone Encounter (Signed)
Tramadol (for pain) is refilled and printed for pick-up at [facility]. Staff at [facility] will contact pt about this.

## 2013-05-04 NOTE — Telephone Encounter (Signed)
Left a message for patient to pick prescription at [address].

## 2013-06-01 ENCOUNTER — Ambulatory Visit: Payer: BC Managed Care – PPO | Admitting: Family Medicine

## 2013-06-04 ENCOUNTER — Other Ambulatory Visit: Payer: Self-pay | Admitting: Family Medicine

## 2013-06-04 NOTE — Telephone Encounter (Signed)
Alprazolam refill phoned to pt's pharmacy. 

## 2013-06-11 ENCOUNTER — Ambulatory Visit (INDEPENDENT_AMBULATORY_CARE_PROVIDER_SITE_OTHER): Payer: BC Managed Care – PPO | Admitting: Family Medicine

## 2013-06-11 ENCOUNTER — Encounter: Payer: Self-pay | Admitting: Family Medicine

## 2013-06-11 VITALS — BP 138/82 | HR 74 | Temp 98.9°F | Resp 16 | Ht 62.0 in | Wt 203.4 lb

## 2013-06-11 DIAGNOSIS — I160 Hypertensive urgency: Secondary | ICD-10-CM

## 2013-06-11 DIAGNOSIS — F32A Depression, unspecified: Secondary | ICD-10-CM

## 2013-06-11 DIAGNOSIS — R5381 Other malaise: Secondary | ICD-10-CM

## 2013-06-11 DIAGNOSIS — R5383 Other fatigue: Secondary | ICD-10-CM

## 2013-06-11 DIAGNOSIS — I10 Essential (primary) hypertension: Secondary | ICD-10-CM

## 2013-06-11 DIAGNOSIS — F329 Major depressive disorder, single episode, unspecified: Secondary | ICD-10-CM

## 2013-06-11 DIAGNOSIS — E039 Hypothyroidism, unspecified: Secondary | ICD-10-CM

## 2013-06-11 DIAGNOSIS — F419 Anxiety disorder, unspecified: Secondary | ICD-10-CM

## 2013-06-11 DIAGNOSIS — F341 Dysthymic disorder: Secondary | ICD-10-CM

## 2013-06-11 MED ORDER — METFORMIN HCL 500 MG PO TABS: ORAL_TABLET | ORAL | Status: DC

## 2013-06-11 MED ORDER — CITALOPRAM HYDROBROMIDE 20 MG PO TABS: ORAL_TABLET | ORAL | Status: DC

## 2013-06-11 MED ORDER — METOPROLOL TARTRATE 100 MG PO TABS: ORAL_TABLET | ORAL | Status: DC

## 2013-06-11 MED ORDER — HYDROCHLOROTHIAZIDE 12.5 MG PO TABS: 12.5000 mg | ORAL_TABLET | Freq: Every day | ORAL | Status: DC

## 2013-06-11 NOTE — Patient Instructions (Signed)
Take your thyroid medication by itself every morning. I have increased Citalopram to 30 mg every evening. That will be 1 1/2 tablets (20 mg).

## 2013-06-15 NOTE — Progress Notes (Signed)
S:  This 58 y.o. 27 Romero returns for follow-up re: depression, fatigue and thyroid disease. She reports significant improvement w/ Citalopram initially but now feels sad and lacks motivation and energy. Work performance is suffering because of fatigue and sleep disturbance. Alprazolam is effective for sleep. Pt also having knee and leg pain; she is concerned about when to take Tramadol in relation to other medications. She is taking thyroid medication w/ other medications in the morning after breakfast or as she is rushing out the door to work. Concentration is affected by lack of restful sleep. No thought of self-harm, SI/HI. No behavior changes or agitation. Pt denies illness exposure.  DM- pt is compliant w/ medication and tries to maintain healthy nutrition. Exercise- very little. No report re: FSBS. No hypoglycemic episodes.  HTN- stable and controlled. Pt is compliant w/ medication and denies vision disturbance, CP or tightness, palpitations, SOB or DOE, cough, HA, dizziness, numbness or syncope.  Patient Active Problem List   Diagnosis Date Noted  . DJD (degenerative joint disease) of knee 11/19/2012  . Obesity, unspecified 11/12/2012  . Anxiety and depression 06/25/2012  . Hypertension 06/25/2012  . Diabetes 06/25/2012  . Hypothyroid 06/25/2012   PMHx, Surg Hx, Soc and Fam Hx reviewed. Medications reconciled.  ROS: As per HPI.  O: Filed Vitals:   06/11/13 1605  BP: 138/82  Pulse: 74  Temp: 98.9 F (37.2 C)  Resp: 16   GEN: In NAD: WN,WD. HENT: [place]/AT; EOMI w/ clear conj/sclerae. EACs/TMs clear. Nasal mucosa and oroph clear and moist. No erythema or exudate. NECK: Supple w/o LAN or TMG. COR: RRR. Nomral S1 and S2. No m/g/r. LUNGS: CTA; normal resp rate and effort. BACK: No CVAT. SKIN: W&D; intact w/o erythema, diaphoresis or pallor. NEURO: A&O x 3; CNs intact. Nonfocal. PSYCH: Pleasant and calm; mildly flat affect. Not labile or inappropriate. Pt is attentive w/ good eye  contact; she is not withdrawn. Speech is coherent and thought content is normal. Judgement is sound.  A/P: Hypothyroid- Pt advised to take thyroid medication first thing in the morning, separate from all other medications.  Anxiety and depression- Increase Citalopram to 30 mg daily.  Hypertension- Stable and controlled on current medication. No change at this time.  Fatigue- Related to thyroid dysfunction and depression. Anticipate improvement w/ medication changes.  Meds ordered this encounter  Medications  . metFORMIN (GLUCOPHAGE) 500 MG tablet    Sig: TAKE ONE TABLET TWICE DAILY FOR DIABETES    Dispense:  60 tablet    Refill:  5  . citalopram (CELEXA) 20 MG tablet    Sig: Take 1 1/2 tablets every night for depression.    Dispense:  [place] tablet    Refill:  5  . hydrochlorothiazide (HYDRODIURIL) 12.5 MG tablet    Sig: Take 1 tablet (12.5 mg total) by mouth daily.    Dispense:  90 tablet    Refill:  1            . metoprolol (LOPRESSOR) 100 MG tablet    Sig: TAKE ONE TABLET BY MOUTH TWICE DAILY    Dispense:  60 tablet    Refill:  5

## 2013-06-28 ENCOUNTER — Encounter: Payer: BC Managed Care – PPO | Admitting: Women's Health

## 2013-06-29 ENCOUNTER — Other Ambulatory Visit: Payer: Self-pay | Admitting: Family Medicine

## 2013-06-29 NOTE — Telephone Encounter (Signed)
Alprazolam 1 mg tablet refill phoned to pt's pharmacy; will not be filled before Jul 04, 2013.

## 2013-07-01 ENCOUNTER — Other Ambulatory Visit (HOSPITAL_COMMUNITY)
Admission: RE | Admit: 2013-07-01 | Discharge: 2013-07-01 | Disposition: A | Discharge status: Home / Self Care | Payer: BC Managed Care – PPO | Source: Ambulatory Visit | Attending: Gynecology | Admitting: Gynecology

## 2013-07-01 ENCOUNTER — Encounter: Payer: Self-pay | Admitting: Women's Health

## 2013-07-01 ENCOUNTER — Ambulatory Visit (INDEPENDENT_AMBULATORY_CARE_PROVIDER_SITE_OTHER): Payer: BC Managed Care – PPO | Admitting: Women's Health

## 2013-07-01 VITALS — BP 140/88 | Ht 62.0 in

## 2013-07-01 DIAGNOSIS — Z01419 Encounter for gynecological examination (general) (routine) without abnormal findings: Secondary | ICD-10-CM | POA: Insufficient documentation

## 2013-07-01 NOTE — Addendum Note (Signed)
Addended by: Alen Blew on: 07/01/2013 09:04 AM   Modules accepted: Orders

## 2013-07-01 NOTE — Progress Notes (Signed)
[place] Romero 02-12-1956 [id number]    History:    Presents for annual exam.  Postmenopausal/no bleeding/no HRT. Normal Pap and mammogram history. Normal DEXA 08/2012 T score bilateral hip -0.6. 2005 Colonoscopy benign polyp. Diabetes/hypertension/hyperthyroidism-primary care manages. Anxiety and depression -therapist.  Past medical history, past surgical history, family history and social history were all reviewed and documented in the EPIC chart. Works at [employer]. Ex husband HIV and lung cancer helping to care for him [place] neg HIV). 2 children both doing well.  ROS:  A  ROS was performed and pertinent positives and negatives are included.  Exam:  Filed Vitals:   07/01/13 0758  BP: 140/88    General appearance:  Normal Thyroid:  Symmetrical, normal in size, without palpable masses or nodularity. Respiratory  Auscultation:  Clear without wheezing or rhonchi Cardiovascular  Auscultation:  Regular rate, without rubs, murmurs or gallops  Edema/varicosities:  Not grossly evident Abdominal  Soft,nontender, without masses, guarding or rebound.  Liver/spleen:  No organomegaly noted  Hernia:  None appreciated  Skin  Inspection:  Grossly normal   Breasts: Examined lying and sitting.     Right: Without masses, retractions, discharge or axillary adenopathy.     Left: Without masses, retractions, discharge or axillary adenopathy. Gentitourinary   Inguinal/mons:  Normal without inguinal adenopathy  External genitalia:  Normal  BUS/Urethra/Skene's glands:  Normal  Vagina:  Normal  Cervix:  Normal  Uterus:   normal in size, shape and contour.  Midline and mobile  Adnexa/parametria:     Rt: Without masses or tenderness.   Lt: Without masses or tenderness.  Anus and perineum: Normal  Digital rectal exam: Normal sphincter tone without palpated masses or tenderness  Assessment/Plan:  58 y.o. DWF G2 P2 for annual exam with no complaints.  Postmenopausal/no HRT/no bleeding/ not  sexually active 08/2012 normal DEXA Diabetes/hypertension/hypothyroid-primary care manages labs and meds  Plan: SBE's, continue annual mammogram, calcium rich diet, vitamin D 2000 daily encouraged. Repeat colonoscopy this year, instructed to schedule. Encouraged to increase regular exercise, decrease calories for weight loss. Pap. Pap normal 06/2011, new screening guidelines reviewed. Condoms encouraged if sexually active.     [place], 8:36 AM 07/01/2013

## 2013-07-01 NOTE — Patient Instructions (Signed)
Health Recommendations for Postmenopausal Women Respected and ongoing research has looked at the most common causes of death, disability, and poor quality of life in postmenopausal women. The causes include heart disease, diseases of blood vessels, diabetes, depression, cancer, and bone loss (osteoporosis). Many things can be done to help lower the chances of developing these and other common problems: CARDIOVASCULAR DISEASE Heart Disease: A heart attack is a medical emergency. Know the signs and symptoms of a heart attack. Below are things women can do to reduce their risk for heart disease.   Do not smoke. If you smoke, quit.  Aim for a healthy weight. Being overweight causes many preventable deaths. Eat a healthy and balanced diet and drink an adequate amount of liquids.  Get moving. Make a commitment to be more physically active. Aim for 30 minutes of activity on most, if not all days of the week.  Eat for heart health. Choose a diet that is low in saturated fat and cholesterol and eliminate trans fat. Include whole grains, vegetables, and fruits. Read and understand the labels on food containers before buying.  Know your numbers. Ask your caregiver to check your blood pressure, cholesterol (total, HDL, LDL, triglycerides) and blood glucose. Work with your caregiver on improving your entire clinical picture.  High blood pressure. Limit or stop your table salt intake (try salt substitute and food seasonings). Avoid salty foods and drinks. Read labels on food containers before buying. Eating well and exercising can help control high blood pressure. STROKE  Stroke is a medical emergency. Stroke may be the result of a blood clot in a blood vessel in the brain or by a brain hemorrhage (bleeding). Know the signs and symptoms of a stroke. To lower the risk of developing a stroke:  Avoid fatty foods.  Quit smoking.  Control your diabetes, blood pressure, and irregular heart rate. THROMBOPHLEBITIS  (BLOOD CLOT) OF THE LEG  Becoming overweight and leading a stationary lifestyle may also contribute to developing blood clots. Controlling your diet and exercising will help lower the risk of developing blood clots. CANCER SCREENING  Breast Cancer: Take steps to reduce your risk of breast cancer.  You should practice "breast self-awareness." This means understanding the normal appearance and feel of your breasts and should include breast self-examination. Any changes detected, no matter how small, should be reported to your caregiver.  After age 40, you should have a clinical breast exam (CBE) every year.  Starting at age 40, you should consider having a mammogram (breast X-ray) every year.  If you have a family history of breast cancer, talk to your caregiver about genetic screening.  If you are at high risk for breast cancer, talk to your caregiver about having an MRI and a mammogram every year.  Intestinal or Stomach Cancer: Tests to consider are a rectal exam, fecal occult blood, sigmoidoscopy, and colonoscopy. Women who are high risk may need to be screened at an earlier age and more often.  Cervical Cancer:  Beginning at age 30, you should have a Pap test every 3 years as long as the past 3 Pap tests have been normal.  If you have had past treatment for cervical cancer or a condition that could lead to cancer, you need Pap tests and screening for cancer for at least 20 years after your treatment.  If you had a hysterectomy for a problem that was not cancer or a condition that could lead to cancer, then you no longer need Pap tests.    If you are between ages 65 and 70, and you have had normal Pap tests going back 10 years, you no longer need Pap tests.  If Pap tests have been discontinued, risk factors (such as a new sexual partner) need to be reassessed to determine if screening should be resumed.  Some medical problems can increase the chance of getting cervical cancer. In these  cases, your caregiver may recommend more frequent screening and Pap tests.  Uterine Cancer: If you have vaginal bleeding after reaching menopause, you should notify your caregiver.  Ovarian cancer: Other than yearly pelvic exams, there are no reliable tests available to screen for ovarian cancer at this time except for yearly pelvic exams.  Lung Cancer: Yearly chest X-rays can detect lung cancer and should be done on high risk women, such as cigarette smokers and women with chronic lung disease (emphysema).  Skin Cancer: A complete body skin exam should be done at your yearly examination. Avoid overexposure to the sun and ultraviolet light lamps. Use a strong sun block cream when in the sun. All of these things are important in lowering the risk of skin cancer. MENOPAUSE Menopause Symptoms: Hormone therapy products are effective for treating symptoms associated with menopause:  Moderate to severe hot flashes.  Night sweats.  Mood swings.  Headaches.  Tiredness.  Loss of sex drive.  Insomnia.  Other symptoms. Hormone replacement carries certain risks, especially in older women. Women who use or are thinking about using estrogen or estrogen with progestin treatments should discuss that with their caregiver. Your caregiver will help you understand the benefits and risks. The ideal dose of hormone replacement therapy is not known. The Food and Drug Administration (FDA) has concluded that hormone therapy should be used only at the lowest doses and for the shortest amount of time to reach treatment goals.  OSTEOPOROSIS Protecting Against Bone Loss and Preventing Fracture: If you use hormone therapy for prevention of bone loss (osteoporosis), the risks for bone loss must outweigh the risk of the therapy. Ask your caregiver about other medications known to be safe and effective for preventing bone loss and fractures. To guard against bone loss or fractures, the following is recommended:  If  you are less than age 50, take 1000 mg of calcium and at least 600 mg of Vitamin D per day.  If you are greater than age 50 but less than age 70, take 1200 mg of calcium and at least 600 mg of Vitamin D per day.  If you are greater than age 70, take 1200 mg of calcium and at least 800 mg of Vitamin D per day. Smoking and excessive alcohol intake increases the risk of osteoporosis. Eat foods rich in calcium and vitamin D and do weight bearing exercises several times a week as your caregiver suggests. DIABETES Diabetes [place]: If you have Type I or Type 2 diabetes, you should keep your blood sugar under control with diet, exercise and recommended medication. Avoid too many sweets, starchy and fatty foods. Being overweight can make control more difficult. COGNITION AND MEMORY Cognition and Memory: Menopausal hormone therapy is not recommended for the prevention of cognitive disorders such as Alzheimer's disease or memory loss.  DEPRESSION  Depression may occur at any age, but is common in elderly women. The reasons may be because of physical, medical, social (loneliness), or financial problems and needs. If you are experiencing depression because of medical problems and control of symptoms, talk to your caregiver about this. Physical activity and   exercise may help with mood and sleep. Community and volunteer involvement may help your sense of value and worth. If you have depression and you feel that the problem is getting worse or becoming severe, talk to your caregiver about treatment options that are best for you. ACCIDENTS  Accidents are common and can be serious in the elderly woman. Prepare your house to prevent accidents. Eliminate throw rugs, place hand bars in the bath, shower and toilet areas. Avoid wearing high heeled shoes or walking on wet, snowy, and icy areas. Limit or stop driving if you have vision or hearing problems, or you feel you are unsteady with you movements and  reflexes. HEPATITIS C Hepatitis C is a type of viral infection affecting the liver. It is spread mainly through contact with blood from an infected person. It can be treated, but if left untreated, it can lead to severe liver damage over years. Many people who are infected do not know that the virus is in their blood. If you are a "baby-boomer", it is recommended that you have one screening test for Hepatitis C. IMMUNIZATIONS  Several immunizations are important to consider having during your senior years, including:   Tetanus, diptheria, and pertussis booster shot.  Influenza every year before the flu season begins.  Pneumonia vaccine.  Shingles vaccine.  Others as indicated based on your specific needs. Talk to your caregiver about these. Document Released: 07/12/2005 Document Revised: 05/06/2012 Document Reviewed: 03/07/2008 Exit[employer] Patient Information 2014 [employer].  

## 2013-07-23 ENCOUNTER — Ambulatory Visit: Payer: Self-pay | Admitting: Family Medicine

## 2013-07-30 ENCOUNTER — Other Ambulatory Visit: Payer: Self-pay | Admitting: Family Medicine

## 2013-07-30 MED ORDER — ALPRAZOLAM 1 MG PO TABS: ORAL_TABLET | ORAL | Status: DC

## 2013-07-30 MED ORDER — TRAMADOL HCL 50 MG PO TABS: ORAL_TABLET | ORAL | Status: DC

## 2013-07-30 NOTE — Telephone Encounter (Signed)
Alprazolam and Tramadol refills phoned to pt's pharmacy.

## 2013-08-04 ENCOUNTER — Ambulatory Visit (INDEPENDENT_AMBULATORY_CARE_PROVIDER_SITE_OTHER): Payer: BC Managed Care – PPO | Admitting: Family Medicine

## 2013-08-04 ENCOUNTER — Encounter: Payer: Self-pay | Admitting: Family Medicine

## 2013-08-04 VITALS — BP 168/78 | HR 68 | Temp 97.3°F | Resp 16 | Ht 62.0 in | Wt 208.0 lb

## 2013-08-04 DIAGNOSIS — E119 Type 2 diabetes mellitus without complications: Secondary | ICD-10-CM

## 2013-08-04 DIAGNOSIS — J209 Acute bronchitis, unspecified: Secondary | ICD-10-CM

## 2013-08-04 DIAGNOSIS — F32A Depression, unspecified: Secondary | ICD-10-CM

## 2013-08-04 DIAGNOSIS — F341 Dysthymic disorder: Secondary | ICD-10-CM

## 2013-08-04 DIAGNOSIS — I10 Essential (primary) hypertension: Secondary | ICD-10-CM

## 2013-08-04 DIAGNOSIS — F329 Major depressive disorder, single episode, unspecified: Secondary | ICD-10-CM

## 2013-08-04 DIAGNOSIS — F419 Anxiety disorder, unspecified: Secondary | ICD-10-CM

## 2013-08-04 LAB — POCT GLYCOSYLATED HEMOGLOBIN (HGB A1C): Hemoglobin A1C: 5.8

## 2013-08-04 MED ORDER — CITALOPRAM HYDROBROMIDE 20 MG PO TABS: ORAL_TABLET | ORAL | Status: DC

## 2013-08-04 NOTE — Patient Instructions (Signed)
DEPRESSION- I have increased Citalopram 20 mg to 2 tablets every evening; this equals a daily dose = 40 mg.  Continue taking Alprazolam 1/2 tablet at midday for anxiety (if it does not make you sleepy) and 1 tablet at bedtime for sleep.  HOSPICE provides counseling for all individuals who are going through the GRIEF process.  Take advantage of this service once your ex-husband is admitted to [facility].  I have discussed the DISABILITY process with your; you have to get the process started by going to the [place] office and sitting with a staff person to find out what is required. You will need to sign release-of-information forms so that copies of your medical records can be obtained from all professionals who have ever treated you for medical and/or mental health issues.  You may want to talk with your boss/ supervisor about reduced work hours while we work on getting your medical problems better controlled.

## 2013-08-04 NOTE — Progress Notes (Signed)
Subjective:    Patient ID: FINNLEIGH MARCHETTI, female    DOB: [birth year], 58 y.o.   MRN: [id number]  Cough Pertinent negatives include no chills, fever, postnasal drip, rhinorrhea, sore throat, shortness of breath or wheezing.     This 58 y.o. Cauc female has inadequately controlled HTN; she is compliant w/ medication but recent stressors have caused elevated readings. Pt has chronic depression and anxiety w/ exacerbation of symptoms due to ex-husband's terminal illness and admission to [facility] this week. PTSD has responded to counseling in the past but pt cannot afford to seek counseling at this time due to finances. She has poor appetite and insomnia; current medications (Citalopram and Alprazolam) are partially effective.   Pt continues to work full time but is considering FMLA. This is not an option if pay is suspended during leave from work. She is also considering filing for disability; she feels that she cannot continue to work given multiple medical problems. DJD of knees is increasingly impacting her ambulation and nocturnal pain interrupts sleep.  Today, pt presents w/ mildly prod cough. She denies fever/chills, CP or tightness, orthopnea or hemoptysis. She continue to smoke; she calls herself "a closet smoker".   Patient Active Problem List   Diagnosis Date Noted  . DJD (degenerative joint disease) of knee 11/19/2012  . Obesity, unspecified 11/12/2012  . Anxiety and depression 06/25/2012  . Hypertension 06/25/2012  . Diabetes 06/25/2012  . Hypothyroid 06/25/2012   PMHx, Surg Hx, Soc and Fam Hx reviewed.  Review of Systems  Constitutional: Positive for appetite change and fatigue. Negative for fever, chills and diaphoresis.  HENT: Negative for congestion, postnasal drip, rhinorrhea, sinus pressure, sore throat and trouble swallowing.   Eyes: Negative.   Respiratory: Positive for cough. Negative for choking, chest tightness, shortness of breath and wheezing.     Cardiovascular: Negative.   Gastrointestinal: Negative.   Musculoskeletal: Negative.   Skin: Negative.   Neurological: Negative.   Psychiatric/Behavioral: Positive for sleep disturbance, dysphoric mood and decreased concentration. Negative for suicidal ideas, behavioral problems, confusion, self-injury and agitation. The patient is nervous/anxious.       Objective:   Physical Exam  Nursing note and vitals reviewed. Constitutional: She is oriented to person, place, and time. She appears well-developed and well-nourished. No distress.  HENT:  Head: Normocephalic and atraumatic.  Right Ear: Hearing, tympanic membrane, external ear and ear canal normal.  Left Ear: Hearing, external ear and ear canal normal. Tympanic membrane is injected. Tympanic membrane is not scarred and not bulging.  No middle ear effusion.  Nose: Mucosal edema present. No rhinorrhea, nasal deformity or septal deviation.  Mouth/Throat: Uvula is midline and mucous membranes are normal. No oral lesions. No uvula swelling. Posterior oropharyngeal erythema present. No oropharyngeal exudate.  Eyes: Conjunctivae and EOM are normal. Pupils are equal, round, and reactive to light. No scleral icterus.  Neck: Neck supple. No thyromegaly present.  Cardiovascular: Normal rate, regular rhythm and normal heart sounds.  Exam reveals no gallop and no friction rub.   No murmur heard. Pulmonary/Chest: Effort normal and breath sounds normal. No respiratory distress. She has no wheezes.  Musculoskeletal:  Antalgic gait due to DJD of knees.  Lymphadenopathy:    She has no cervical adenopathy.  Neurological: She is alert and oriented to person, place, and time. She has normal strength. She displays no tremor. No cranial nerve deficit or sensory deficit. She exhibits normal muscle tone. Gait abnormal. Coordination normal.  Skin: Skin is warm, dry and  intact. No ecchymosis and no rash noted. She is not diaphoretic. No cyanosis or erythema. No  pallor.  Psychiatric: Her speech is normal. Judgment and thought content normal. Her mood appears not anxious. Her affect is not labile and not inappropriate. She is slowed. She is not agitated, not aggressive and not withdrawn. Cognition and memory are normal. She exhibits a depressed mood. She is attentive.    Results for orders placed in visit on 08/04/13  POCT GLYCOSYLATED HEMOGLOBIN (HGB A1C)      Result Value Ref Range   Hemoglobin A1C 5.8        Assessment & Plan:  Diabetes - Stable and controlled. Continue same medications. Plan: POCT glycosylated hemoglobin (Hb A1C), HM Diabetes Foot Exam  Acute bronchitis - Advised tobacco cessation. Plan: PR INHAL RX, AIRWAY OBST/DX SPUTUM INDUCT, CANCELED  Anxiety and depression- PHQ-9 score= 24. Increase Citalopram to 40 mg daily (20 mg - 2 tabs daily).  Hypertension - Plan: Continue current medications. Advise reduced work schedule.  DISABILITY PROCESS discussed w/ pt; advised that it is a lengthy process which could take up to 12 months and could result in denial the first time; appeal process available. Decisions based on multiple factors. Repeated denials result in applicants getting legal assistance.  Attempts to draw blood for labs was unsuccessful; pt to RTC in 3 weeks at which time labs will be drawn.   Meds ordered this encounter  Medications  . citalopram (CELEXA) 20 MG tablet    Sig: Take 2 tablets every night for depression.    Dispense:  60 tablet    Refill:  3

## 2013-08-23 LAB — HM DIABETES EYE EXAM

## 2013-08-25 ENCOUNTER — Encounter: Payer: Self-pay | Admitting: *Deleted

## 2013-08-25 ENCOUNTER — Ambulatory Visit: Payer: BC Managed Care – PPO | Admitting: Family Medicine

## 2013-08-25 DIAGNOSIS — Z0100 Encounter for examination of eyes and vision without abnormal findings: Principal | ICD-10-CM

## 2013-08-25 DIAGNOSIS — E119 Type 2 diabetes mellitus without complications: Secondary | ICD-10-CM | POA: Insufficient documentation

## 2013-08-30 ENCOUNTER — Other Ambulatory Visit: Payer: Self-pay | Admitting: Family Medicine

## 2013-08-30 NOTE — Telephone Encounter (Signed)
Alprazolam refill phoned to pharmacy (#45 w/ 1 RF).

## 2013-09-08 ENCOUNTER — Ambulatory Visit (INDEPENDENT_AMBULATORY_CARE_PROVIDER_SITE_OTHER): Payer: BC Managed Care – PPO | Admitting: Family Medicine

## 2013-09-08 ENCOUNTER — Encounter: Payer: Self-pay | Admitting: Family Medicine

## 2013-09-08 VITALS — BP 136/87 | HR 83 | Temp 98.3°F | Resp 18 | Ht 62.0 in | Wt 204.0 lb

## 2013-09-08 DIAGNOSIS — F419 Anxiety disorder, unspecified: Secondary | ICD-10-CM

## 2013-09-08 DIAGNOSIS — E039 Hypothyroidism, unspecified: Secondary | ICD-10-CM

## 2013-09-08 DIAGNOSIS — E1159 Type 2 diabetes mellitus with other circulatory complications: Secondary | ICD-10-CM | POA: Insufficient documentation

## 2013-09-08 DIAGNOSIS — E119 Type 2 diabetes mellitus without complications: Secondary | ICD-10-CM | POA: Insufficient documentation

## 2013-09-08 DIAGNOSIS — E785 Hyperlipidemia, unspecified: Secondary | ICD-10-CM

## 2013-09-08 DIAGNOSIS — F32A Depression, unspecified: Secondary | ICD-10-CM

## 2013-09-08 DIAGNOSIS — E1169 Type 2 diabetes mellitus with other specified complication: Secondary | ICD-10-CM | POA: Insufficient documentation

## 2013-09-08 DIAGNOSIS — F341 Dysthymic disorder: Secondary | ICD-10-CM

## 2013-09-08 DIAGNOSIS — F329 Major depressive disorder, single episode, unspecified: Secondary | ICD-10-CM

## 2013-09-08 LAB — THYROID PANEL WITH TSH
Free Thyroxine Index: 2.4 (ref 1.0–3.9)
T3 UPTAKE: 29 % (ref 22.5–37.0)
T4 TOTAL: 8.2 ug/dL (ref 5.0–12.5)
TSH: 24.644 u[IU]/mL — ABNORMAL HIGH (ref 0.350–4.500)

## 2013-09-08 LAB — LIPID PANEL
CHOLESTEROL: 307 mg/dL — AB (ref 0–200)
HDL: 39 mg/dL — ABNORMAL LOW (ref >39)
LDL Cholesterol: 195 mg/dL — ABNORMAL HIGH (ref 0–99)
Total CHOL/HDL Ratio: 7.9 Ratio
Triglycerides: 365 mg/dL — ABNORMAL HIGH (ref <150)
VLDL: 73 mg/dL — AB (ref 0–40)

## 2013-09-08 NOTE — Patient Instructions (Addendum)
You are doing well given your recent loss; continue to focus on staying healthy and taking your medications as well as practicing good nutrition.  If you are in need of grief counseling, Hospice offers services to anyone who needs it.

## 2013-09-08 NOTE — Progress Notes (Signed)
S:  This 58 y.o. Cauc female is here for DM follow-up and fasting labs. FSBS are "good"; pt has been working hard to manage nutrition and to lose weight. She denies hypoglycemia, diaphoresis, vision disturbances, HA, weakness or syncope.  Her husband, from whom she was separated, died on 08-28-13. She is grateful the time she had with him prior to his death. She does not recall much that has happened since his death. Also notes that this is the time of year when her favorite brother died. She does feel that Citalopram 40 mg daily has been very effective. She is ready to return to work; early retirement is not feasible at this time. She feels that she may need to file for disability given her physical and mental health issues but she still enjoys her work at [employer].  Patient Active Problem List   Diagnosis Date Noted  . DM type 2 with diabetic dyslipidemia 09/08/2013  . Type II or unspecified type diabetes mellitus without mention of complication, uncontrolled 09/08/2013  . Diabetic eye exam 08/25/2013  . DJD (degenerative joint disease) of knee 11/19/2012  . Obesity, unspecified 11/12/2012  . Anxiety and depression 06/25/2012  . Hypertension 06/25/2012  . Diabetes 06/25/2012  . Hypothyroid 06/25/2012   Prior to Admission medications   Medication Sig Start Date End Date Taking? Authorizing Provider  ALPRAZolam (XANAX) 1 MG tablet TAKE 1/2 TABLET BY MOUTH AT MIDDAY AND 1 TABLET NIGHLY AT BEDTIME AS NEEDED FOR ANXIETY AND SLEEP 08/30/13  Yes Barton Fanny, MD  citalopram (CELEXA) 20 MG tablet Take 2 tablets every night for depression. 08/04/13  Yes Barton Fanny, MD  hydrochlorothiazide (HYDRODIURIL) 12.5 MG tablet Take 1 tablet (12.5 mg total) by mouth daily. 06/11/13  Yes Barton Fanny, MD  levothyroxine (SYNTHROID, LEVOTHROID) 125 MCG tablet Take 1 tablet (125 mcg total) by mouth daily. 04/01/13  Yes Barton Fanny, MD  metFORMIN (GLUCOPHAGE) 500 MG tablet TAKE  ONE TABLET TWICE DAILY FOR DIABETES 06/11/13  Yes Barton Fanny, MD  metoprolol (LOPRESSOR) 100 MG tablet TAKE ONE TABLET BY MOUTH TWICE DAILY 06/11/13  Yes Barton Fanny, MD  traMADol (ULTRAM) 50 MG tablet TAKE 1 TABLET BY MOUTH EVERY 8 HOURS AS NEEDED 07/30/13  Yes Barton Fanny, MD   PMHx, Surg Hx, Soc and Fam Hx reviewed.  ROS: As per HPI; noncontributory.  O: Filed Vitals:   09/08/13 0953  BP: 136/87  Pulse: 83  Temp: 98.3 F (36.8 C)  Resp: 18   GEN: In NAD; WN,WD. Weight down 4 lbs since last visit. HENT: /AT; EOMI w/clear conj/sclerae. EACs/nose/oroph normal. COR: RRR. No edema. LUNGS; Normal resp rate and effort. SKIN: W&D; intact w/o diaphoresis, erythema, jaundice or pallor. See DM Foot exam. NEURO: A&O x 3; CNs intact. Nonfocal. PSYCH: Pleasant and calm, conversant about her relationship w/ deceased husband. Mood is appropriate. Speech and thought pattern are normal. Judgement is sound.  A/P: DM type 2 with diabetic dyslipidemia - Stable on current medication. Plan: HM Diabetes Foot Exam, Lipid panel  Hypothyroid - Continue current medication pending lab results. Plan: Thyroid Panel With TSH, Lipid panel  Anxiety and depression -Stable on Citalopram 40 mg daily. Continue Alprazolam as needed for sleep.

## 2013-09-10 ENCOUNTER — Telehealth: Payer: Self-pay | Admitting: Family Medicine

## 2013-09-10 MED ORDER — LEVOTHYROXINE SODIUM 150 MCG PO TABS: 150.0000 ug | ORAL_TABLET | Freq: Every day | ORAL | Status: DC

## 2013-09-10 NOTE — Progress Notes (Signed)
Quick Note:  Please mail copy of labs to pt.  I discussed these results with you via phone call and advised you about medication changes. You will get an over-the-counter Fish Oil supplement. I recommended NATURE MADE 1200 mg capsule; take 1 capsule daily by itself.  I have changed Levothyroxine dose; it has been increased to 150 mcg 1 tab every morning. You can pick this up from [facility] when you have finished the tablets you have (you are to take 1 and 1/2 tablets of the current thyroid medication you have until they are gone).  I have advised that you Return to clinic in 6-8 weeks. ______

## 2013-09-10 NOTE — Telephone Encounter (Signed)
Spoke with pt about thyroid test results; pt currently taking Levothyroxine 125 mcg. She is instructed to take 1.5 tablets until this supply runs out. New dose: Levothyroxine 150 mcg 1 tablet daily. Also discussed lipid results; pt would rather try non-prescirption methods to lower lipids. I suggested she get OTC Fish Oil 1200 mg and take 1 daily; continue working on healthy lifestyle changes. She will return to clinic in 6-8 weeks.

## 2013-09-11 ENCOUNTER — Encounter: Payer: Self-pay | Admitting: Radiology

## 2013-11-01 ENCOUNTER — Other Ambulatory Visit: Payer: Self-pay | Admitting: Family Medicine

## 2013-11-01 NOTE — Telephone Encounter (Signed)
Alprazolam refilled phoned to pt's pharmacy.

## 2013-11-10 ENCOUNTER — Telehealth: Payer: Self-pay

## 2013-11-10 NOTE — Telephone Encounter (Signed)
Phoned to pt to see how she is doing. She is feeling ore depressed w/ grief and stress at work. I advised her that I could give her an excuse from work since she has not been seen in 2 months. I will see if we can sch her to come in tomorrow afternoon.

## 2013-11-10 NOTE — Telephone Encounter (Signed)
Dr Leward Quan:  Patient is wanting an out of work note for depression for Thursday and Friday.  Please call patient back at work [phone].

## 2013-11-11 ENCOUNTER — Ambulatory Visit (INDEPENDENT_AMBULATORY_CARE_PROVIDER_SITE_OTHER): Payer: BC Managed Care – PPO | Admitting: Family Medicine

## 2013-11-11 ENCOUNTER — Encounter: Payer: Self-pay | Admitting: Family Medicine

## 2013-11-11 VITALS — BP 147/81 | HR 70 | Temp 98.3°F | Resp 18 | Ht 62.0 in | Wt 212.0 lb

## 2013-11-11 DIAGNOSIS — I10 Essential (primary) hypertension: Secondary | ICD-10-CM

## 2013-11-11 DIAGNOSIS — E1165 Type 2 diabetes mellitus with hyperglycemia: Principal | ICD-10-CM

## 2013-11-11 DIAGNOSIS — IMO0001 Reserved for inherently not codable concepts without codable children: Secondary | ICD-10-CM

## 2013-11-11 DIAGNOSIS — E039 Hypothyroidism, unspecified: Secondary | ICD-10-CM

## 2013-11-11 LAB — POCT GLYCOSYLATED HEMOGLOBIN (HGB A1C): Hemoglobin A1C: 6.1

## 2013-11-11 NOTE — Patient Instructions (Signed)
I have given you a letter for 2 days from work. Let me know if you feel that you need more time. I would like to see you in 3 months for Diabetes and thyroid follow-up. Let me know if you feel that the current medication for depression is not as effective.  As I suggested, take Alprazolam twice a day to help improve your mood and keep you calm.

## 2013-11-12 ENCOUNTER — Encounter: Payer: Self-pay | Admitting: Family Medicine

## 2013-11-12 ENCOUNTER — Telehealth: Payer: Self-pay | Admitting: Family Medicine

## 2013-11-12 DIAGNOSIS — E785 Hyperlipidemia, unspecified: Secondary | ICD-10-CM

## 2013-11-12 DIAGNOSIS — E039 Hypothyroidism, unspecified: Secondary | ICD-10-CM

## 2013-11-12 DIAGNOSIS — E1169 Type 2 diabetes mellitus with other specified complication: Secondary | ICD-10-CM

## 2013-11-12 LAB — LIPID PANEL
Cholesterol: 280 mg/dL — ABNORMAL HIGH (ref 0–200)
HDL: 36 mg/dL — AB (ref >39)
LDL CALC: 176 mg/dL — AB (ref 0–99)
TRIGLYCERIDES: 340 mg/dL — AB (ref <150)
Total CHOL/HDL Ratio: 7.8 Ratio
VLDL: 68 mg/dL — ABNORMAL HIGH (ref 0–40)

## 2013-11-12 LAB — TSH: TSH: 4.366 u[IU]/mL (ref 0.350–4.500)

## 2013-11-12 MED ORDER — LEVOTHYROXINE SODIUM 175 MCG PO TABS: 175.0000 ug | ORAL_TABLET | Freq: Every day | ORAL | Status: DC

## 2013-11-12 MED ORDER — PRAVASTATIN SODIUM 20 MG PO TABS: ORAL_TABLET | ORAL | Status: DC

## 2013-11-12 MED ORDER — PRAVASTATIN SODIUM 20 MG PO TABS: 20.0000 mg | ORAL_TABLET | Freq: Every day | ORAL | Status: DC

## 2013-11-12 NOTE — Telephone Encounter (Signed)
Spoke with pt about labs. Increased Levothyroxine to 175 mcg. She will pick up med in ~ 4-5 days. Will start statin due to very high lipids. Pravastatin ordered. Pt thinks she may need something extra for depression. Advised her to try increased thyroid med dose and see how she feels in 6-8 weeks. Advised about myalgias as potential SE w/ statin and she will contact office if this occurs. She will come in for fasting labs in 6-8 weeks. Future order placed.

## 2013-11-12 NOTE — Progress Notes (Signed)
S:  This 58 y.o. Cauc female has Type II DM, HTN and hypothyroidism. She is compliant with all medications but has been feeling more fatigued and depressed lately. She works at [employer]) and job stress has increased lately. 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 She is still grieving death of close friend 3 months ago.   Patient Active Problem List   Diagnosis Date Noted  . DM type 2 with diabetic dyslipidemia 09/08/2013  . Type II or unspecified type diabetes mellitus without mention of complication, uncontrolled 09/08/2013  . Diabetic eye exam 08/25/2013  . DJD (degenerative joint disease) of knee 11/19/2012  . Obesity, unspecified 11/12/2012  . Anxiety and depression 06/25/2012  . Hypertension 06/25/2012  . Diabetes 06/25/2012  . Hypothyroid 06/25/2012    Prior to Admission medications   Medication Sig Start Date End Date Taking? Authorizing Provider  ALPRAZolam (XANAX) 1 MG tablet TAKE 1/2 TABLET BY MOUTH AT MIDDAY AND 1 TABLET AT BEDTIME AS NEEDED FOR SLEEP AND ANXIETY   Yes Barton Fanny, MD  citalopram (CELEXA) 20 MG tablet Take 2 tablets every night for depression. 08/04/13  Yes Barton Fanny, MD  hydrochlorothiazide (HYDRODIURIL) 12.5 MG tablet Take 1 tablet (12.5 mg total) by mouth daily. 06/11/13  Yes Barton Fanny, MD  levothyroxine (SYNTHROID, LEVOTHROID) 150 MCG tablet Take 1 tablet (150 mcg total) by mouth daily. 09/10/13  Yes Barton Fanny, MD  metFORMIN (GLUCOPHAGE) 500 MG tablet TAKE ONE TABLET TWICE DAILY FOR DIABETES 06/11/13  Yes Barton Fanny, MD  metoprolol (LOPRESSOR) 100 MG tablet TAKE ONE TABLET BY MOUTH TWICE DAILY 06/11/13  Yes Barton Fanny, MD  traMADol (ULTRAM) 50 MG tablet TAKE 1 TABLET BY MOUTH EVERY 8 HOURS AS NEEDED 07/30/13  Yes Barton Fanny, MD   PMHx, Surg Hx, Soc and Fam Hx reviewed.  ROS: As per HPI; negative for  diaphoresis, anorexia, abnormal weight loss, vision disturbances, CP or tightness, palpitations, SOB or DOE, cough, edema, GI upset, HA, dizziness,weakness, numbness or syncope. She has no thoughts of slef-harm, SI/HI.  O: Filed Vitals:   11/11/13 1204  BP: 147/81  Pulse: 70  Temp: 98.3 F (36.8 C)  Resp: 18    GEN: In NAD; WN,WD. HENT: [place]/AT; EOMI w/ clear conj/sclerae. Otherwise unremarkable. COR: RRR. LUNGS: Unlabored resp. SKIN: W&D; intact w/o erythema, diaphoresis or pallor. NEURO: A&O x 3; CNs intact. Nonfocal. PSYCH: Pleasant and calm; flat affect. Attentive w/ good eye contact. Not labile or inappropriate. Thought content normal and judgement sound.   PHQ-9 score= 11  Results for orders placed in visit on 11/11/13  POCT GLYCOSYLATED HEMOGLOBIN (HGB A1C)      Result Value Ref Range   Hemoglobin A1C 6.1     A/P: Type II or unspecified type diabetes mellitus without mention of complication, uncontrolled - Stable and controlled. Continue current medications. Plan: POCT glycosylated hemoglobin (Hb A1C), Lipid panel  Hypothyroid - Continue current medication pending lab results.   Plan: TSH  Hypertension- Stable and controlled on current medication.

## 2013-11-15 ENCOUNTER — Telehealth: Payer: Self-pay

## 2013-11-15 NOTE — Telephone Encounter (Signed)
Pt is requesting a work note to be out of work all this week. If approved please fax to her employer Carlynn Spry with [employer] @ fax number [phone] Pt can be reached at [phone]

## 2013-11-16 NOTE — Telephone Encounter (Signed)
I wrote a letter excusing pt from work at [employer] until November 21, 2013. It was sent via fax to the phone number provided.

## 2013-11-30 ENCOUNTER — Telehealth: Payer: Self-pay

## 2013-11-30 NOTE — Telephone Encounter (Signed)
SOCIAL WORKER CALLED STATING HE NEEDS TO SPEAK WITH DR [facility] REGARDING SOME DISABILITY ISSUES WITH THE PATIENT. HE SAID HE WOULD FAX CONFIDENTIALITY FORM ONCE SOMEONE CONTACTED HIM.

## 2013-12-01 ENCOUNTER — Encounter: Payer: Self-pay | Admitting: Family Medicine

## 2013-12-01 ENCOUNTER — Ambulatory Visit (INDEPENDENT_AMBULATORY_CARE_PROVIDER_SITE_OTHER): Payer: BC Managed Care – PPO | Admitting: Family Medicine

## 2013-12-01 VITALS — BP 171/86 | HR 71 | Temp 98.2°F | Resp 16 | Ht 62.5 in | Wt 212.0 lb

## 2013-12-01 DIAGNOSIS — F329 Major depressive disorder, single episode, unspecified: Secondary | ICD-10-CM

## 2013-12-01 DIAGNOSIS — F341 Dysthymic disorder: Secondary | ICD-10-CM

## 2013-12-01 DIAGNOSIS — F419 Anxiety disorder, unspecified: Principal | ICD-10-CM

## 2013-12-01 DIAGNOSIS — I10 Essential (primary) hypertension: Secondary | ICD-10-CM

## 2013-12-01 DIAGNOSIS — F32A Depression, unspecified: Secondary | ICD-10-CM

## 2013-12-01 NOTE — Progress Notes (Signed)
S:  This 58 y.o. Cauc female has several chronic medical conditions, most notably anxiety and depression/ PTSD. She recently ~ 4 days out of work because of physical and mental exhaustion. Unfortunately, she has not improved and her therapist, Tanda Rockers at [facility]  Ph # [phone], suggests she apply for temporary medical disability. She will start the process for disability or possibly early retirement. The stress of her work situation combined with her physical challenges makes it difficult for her to work full or part-time.  I agree that she is unlikely to see significant improvement of medical conditions in 4-5 days. Pt also has Type II DM, HTN and hypothyroism.  Per her request, I will have a conversation w/ her therapist and send a letter to her supervisor, Carlynn Spry at [employer] # [phone].  Patient Active Problem List   Diagnosis Date Noted  . DM type 2 with diabetic dyslipidemia 09/08/2013  . Type II or unspecified type diabetes mellitus without mention of complication, uncontrolled 09/08/2013  . Diabetic eye exam 08/25/2013  . DJD (degenerative joint disease) of knee 11/19/2012  . Obesity, unspecified 11/12/2012  . Anxiety and depression 06/25/2012  . Hypertension 06/25/2012  . Diabetes 06/25/2012  . Hypothyroid 06/25/2012   Prior to Admission medications   Medication Sig Start Date End Date Taking? Authorizing Provider  ALPRAZolam (XANAX) 1 MG tablet TAKE 1/2 TABLET BY MOUTH AT MIDDAY AND 1 TABLET AT BEDTIME AS NEEDED FOR SLEEP AND ANXIETY 11/01/13  Yes Barton Fanny, MD  citalopram (CELEXA) 20 MG tablet Take 2 tablets every night for depression. 08/04/13  Yes Barton Fanny, MD  hydrochlorothiazide (HYDRODIURIL) 12.5 MG tablet Take 1 tablet (12.5 mg total) by mouth daily. 06/11/13  Yes Barton Fanny, MD  levothyroxine (SYNTHROID, LEVOTHROID) 175 MCG tablet Take 1 tablet (175 mcg total) by mouth daily before breakfast.  11/12/13  Yes Barton Fanny, MD  metFORMIN (GLUCOPHAGE) 500 MG tablet TAKE ONE TABLET TWICE DAILY FOR DIABETES 06/11/13  Yes Barton Fanny, MD  metoprolol (LOPRESSOR) 100 MG tablet TAKE ONE TABLET BY MOUTH TWICE DAILY 06/11/13  Yes Barton Fanny, MD  pravastatin (PRAVACHOL) 20 MG tablet Take 1 tablet by mouth at bedtime. 11/12/13  Yes Barton Fanny, MD  traMADol (ULTRAM) 50 MG tablet TAKE 1 TABLET BY MOUTH EVERY 8 HOURS AS NEEDED 07/30/13  Yes Barton Fanny, MD   ROS; As per HPI.  O: Filed Vitals:   12/01/13 1109  BP: 171/86  Pulse: 71  Temp: 98.2 F (36.8 C)  Resp: 16   GEN: In NAD but appears tired and chronically ill.  HENT: [place]/AT; EOMI w/ clear conj/sclerae otherwise unremarkable. COR: RRR. LUNGS: Unlabored resp. SKIN: W&D. Intact w/o lesions. NEURO: A&O x 3; CNs intact. Nonfocal. PSYCH: Tearful w/ flat affect. Speech pattern and thought content are normal. Judgement is sound.  PHQ-9 score= 11.  A/P: Anxiety and depression- Continue current medications. Letter will be electronically sent to [facility] @ [employer] re: health status/recommendation for temporary disability.  Essential hypertension- Stable on current medications.

## 2013-12-05 NOTE — Telephone Encounter (Signed)
I will call on Monday

## 2013-12-06 NOTE — Telephone Encounter (Signed)
Called, advised him Dr Leward Quan out of the office, he is mental health counselor, patient has had some recent situations he wants Dr Leward Quan to be aware of. He indicates it is okay for Dr Leward Quan to call when she is back in the office.

## 2013-12-13 ENCOUNTER — Telehealth: Payer: Self-pay

## 2013-12-13 NOTE — Telephone Encounter (Signed)
Patient is out of work until the end of September. She says [employer] faxed over disability paperwork and she wants to make sure Dr Leward Quan received it.

## 2013-12-15 NOTE — Telephone Encounter (Signed)
Patient's paperwork received today. Placed at [place] desk at 104. Patient states that this is an urgent matter. Will notify patient upon completion.

## 2013-12-15 NOTE — Telephone Encounter (Signed)
I called Mr. Sam Jerline Pain who is the pt's therapist and left a message. I tried to contact him at 2 different phone numbers. I have forms to be completed for Short Term Disability for this pt. Once these are complete, pt will be notified.

## 2013-12-20 NOTE — Telephone Encounter (Signed)
Pt called in regards to her FMLA paperwork to be filled out by Dr. Leward Quan, can we update her on a satus of this?

## 2013-12-21 NOTE — Telephone Encounter (Signed)
I spoke w/ Almyra Free today about this; form has been completed re: Short Term Disability. Returned to [facility] today.

## 2013-12-28 ENCOUNTER — Other Ambulatory Visit: Payer: Self-pay | Admitting: Family Medicine

## 2013-12-29 NOTE — Telephone Encounter (Signed)
Alprazolam refill phoned to pt's pharmacy. 

## 2014-01-07 ENCOUNTER — Other Ambulatory Visit (INDEPENDENT_AMBULATORY_CARE_PROVIDER_SITE_OTHER): Payer: BC Managed Care – PPO | Admitting: Family Medicine

## 2014-01-07 DIAGNOSIS — E785 Hyperlipidemia, unspecified: Principal | ICD-10-CM

## 2014-01-07 DIAGNOSIS — E1169 Type 2 diabetes mellitus with other specified complication: Secondary | ICD-10-CM

## 2014-01-07 DIAGNOSIS — E039 Hypothyroidism, unspecified: Secondary | ICD-10-CM

## 2014-01-08 LAB — ALT: ALT: 12 U/L (ref 0–35)

## 2014-01-08 LAB — LIPID PANEL
CHOL/HDL RATIO: 7.3 ratio
Cholesterol: 226 mg/dL — ABNORMAL HIGH (ref 0–200)
HDL: 31 mg/dL — ABNORMAL LOW (ref >39)
LDL Cholesterol: 146 mg/dL — ABNORMAL HIGH (ref 0–99)
Triglycerides: 243 mg/dL — ABNORMAL HIGH (ref <150)
VLDL: 49 mg/dL — AB (ref 0–40)

## 2014-01-08 LAB — TSH: TSH: 1.428 u[IU]/mL (ref 0.350–4.500)

## 2014-01-08 NOTE — Progress Notes (Signed)
Quick Note:  Please advise pt regarding following labs...  Lipid values are trending downward but are still abnormal. Pravastatin 20 mg needs to be doubled to 40 mg at bedtime; take 2 20-mg tablets every night. Lipid values will be rechecked at visit in Sept. Liver function test is normal. Thyroid function test is normal. ______

## 2014-01-11 ENCOUNTER — Telehealth: Payer: Self-pay

## 2014-01-11 NOTE — Telephone Encounter (Signed)
Pt FMLA ppw placed in [place] box for completion in 5-7 business days

## 2014-01-11 NOTE — Telephone Encounter (Signed)
Pt called in to confirm that we have received her FMLA paperwork, said that [employer] told her that they sent it on Tuesday, and has now cut off her benefits. The paperwork was received today, and put in Dr. Janelle Floor box first thing. She would like to ask Dr. Leward Quan if she could please try to expedite this if at all possible, she is down to her last $12 dollars, and can no longer afford her medicines

## 2014-01-12 NOTE — Telephone Encounter (Signed)
Advised pt

## 2014-01-12 NOTE — Telephone Encounter (Signed)
I will work on completing FMLA form today; she should be able to pick it up on Thursday. It will be back at 102 w/ Disability staff person.

## 2014-01-13 ENCOUNTER — Encounter: Payer: Self-pay | Admitting: Family Medicine

## 2014-01-17 ENCOUNTER — Telehealth: Payer: Self-pay | Admitting: *Deleted

## 2014-01-17 NOTE — Telephone Encounter (Signed)
Message copied by Janora Norlander on Mon Jan 17, 2014  2:47 PM ------      Message from: Ellsworth Lennox B      Created: Wed Jan 12, 2014  8:24 AM       Re: letter to insurance company about depression and PTSD- there is a letter dated 12/01/2013 that has been sent (to whom I do not know but I am assuming it was sent to the patient). I have FMLA paperwork that pt wants completed ASAP (just received yesterday). I spoke briefly with pt when she came in to have labs drawn; she stated that her therapist had written a letter several pages long in support of her disability. Perhaps she should check back with him.            Thanks.      ----- Message -----         From: Grant Fontana, CMA         Sent: 01/09/2014   9:16 AM           To: Barton Fanny, MD            Called and spoke to pt in regards to lab work, She expressed understanding. Pt had questions about a letter for her insurance company about her clinical depression and ptsd. Wanted to know if one had been faxed?       ------

## 2014-02-16 ENCOUNTER — Encounter: Payer: Self-pay | Admitting: Family Medicine

## 2014-02-16 ENCOUNTER — Ambulatory Visit (INDEPENDENT_AMBULATORY_CARE_PROVIDER_SITE_OTHER): Payer: BC Managed Care – PPO | Admitting: Family Medicine

## 2014-02-16 VITALS — BP 128/76 | HR 89 | Temp 98.4°F | Resp 16 | Ht 62.0 in | Wt 206.2 lb

## 2014-02-16 DIAGNOSIS — F341 Dysthymic disorder: Secondary | ICD-10-CM

## 2014-02-16 DIAGNOSIS — E119 Type 2 diabetes mellitus without complications: Secondary | ICD-10-CM

## 2014-02-16 DIAGNOSIS — I10 Essential (primary) hypertension: Secondary | ICD-10-CM

## 2014-02-16 DIAGNOSIS — F329 Major depressive disorder, single episode, unspecified: Secondary | ICD-10-CM

## 2014-02-16 DIAGNOSIS — M25569 Pain in unspecified knee: Secondary | ICD-10-CM

## 2014-02-16 DIAGNOSIS — F419 Anxiety disorder, unspecified: Secondary | ICD-10-CM

## 2014-02-16 DIAGNOSIS — Z23 Encounter for immunization: Secondary | ICD-10-CM

## 2014-02-16 DIAGNOSIS — F32A Depression, unspecified: Secondary | ICD-10-CM

## 2014-02-16 LAB — COMPLETE METABOLIC PANEL WITH GFR
ALK PHOS: 127 U/L — AB (ref 39–117)
ALT: 17 U/L (ref 0–35)
AST: 13 U/L (ref 0–37)
Albumin: 4.3 g/dL (ref 3.5–5.2)
BUN: 12 mg/dL (ref 6–23)
CO2: 27 mEq/L (ref 19–32)
Calcium: 9.2 mg/dL (ref 8.4–10.5)
Chloride: 99 mEq/L (ref 96–112)
Creat: 0.85 mg/dL (ref 0.50–1.10)
GFR, EST NON AFRICAN AMERICAN: 76 mL/min
GFR, Est African American: 88 mL/min
GLUCOSE: 111 mg/dL — AB (ref 70–99)
Potassium: 4.1 mEq/L (ref 3.5–5.3)
Sodium: 137 mEq/L (ref 135–145)
Total Bilirubin: 0.3 mg/dL (ref 0.2–1.2)
Total Protein: 6.9 g/dL (ref 6.0–8.3)

## 2014-02-16 MED ORDER — DICLOFENAC SODIUM 75 MG PO TBEC: DELAYED_RELEASE_TABLET | ORAL | Status: DC

## 2014-02-16 MED ORDER — HYDROCHLOROTHIAZIDE 12.5 MG PO TABS: 12.5000 mg | ORAL_TABLET | Freq: Every day | ORAL | Status: DC

## 2014-02-16 MED ORDER — CITALOPRAM HYDROBROMIDE 20 MG PO TABS: ORAL_TABLET | ORAL | Status: DC

## 2014-02-16 MED ORDER — METFORMIN HCL 500 MG PO TABS: ORAL_TABLET | ORAL | Status: DC

## 2014-02-16 MED ORDER — L-METHYLFOLATE-B6-B12 3-35-2 MG PO TABS
ORAL_TABLET | ORAL | Status: DC
Start: 2014-02-16 — End: 2014-04-13

## 2014-02-16 MED ORDER — METOPROLOL TARTRATE 100 MG PO TABS: ORAL_TABLET | ORAL | Status: DC

## 2014-02-16 NOTE — Patient Instructions (Addendum)
For leg pain- I have prescribed a B complex vitamin to be taken twice a day. Also, I prescribed Diclofenac twice a day with food or snack. You can try a topical ARNICA CREAM which can be purchased at your pharmacy without a prescription.  You can apply this several times a day to painful joints and muscles.

## 2014-02-16 NOTE — Progress Notes (Signed)
S:  This 58 y.o. Cauc female returns for Type II DM follow-up w/ last A1c= 6.1% in June 2015. She reports doing better w/ nutrition and exercise. She remains at home, on sort term disability and experiencing significantly less stress. Weight is down 6 lbs. She denies hypoglycemia, vision disturbances, CP, tightness, palpitations, SOB or DOE, HA, fatigue or weakness.   HTN is stable and well controlled on current medications. She has no adverse effects.  Pt has chronic pain in lower legs, not effectively relieved w/ Tramadol. Pt states this med caused nausea and she feels "loopy" w/ each dose. She c/o mild edema and tightness in the muscles.  Citalopram is effective for chronic anxiety and PTSD. Pt continues in therapy. Long term goal is permanent disability based on mental health conditions. She is sleeping better and has less fatigue and concentration difficulty. She has no confusion, GI side effects or increased sweating or new arthralgias. No verbalization of self harm, SI/HI.  Pt interested in [place] but declines Flu vaccine.   Patient Active Problem List   Diagnosis Date Noted  . Pain in joint, lower leg 02/16/2014  . DM type 2 with diabetic dyslipidemia 09/08/2013  . Type II or unspecified type diabetes mellitus without mention of complication, not stated as uncontrolled 09/08/2013  . Diabetic eye exam 08/25/2013  . DJD (degenerative joint disease) of knee 11/19/2012  . Obesity, unspecified 11/12/2012  . Anxiety and depression 06/25/2012  . Hypertension 06/25/2012  . Hypothyroid 06/25/2012    Prior to Admission medications   Medication Sig Start Date End Date Taking? Authorizing Provider  ALPRAZolam (XANAX) 1 MG tablet TAKE 1/2 TABLET BY MOUTH AT MIDDAY AND 1 TABLET AT BEDTIME AS NEEDED FOR SLEEP AND ANXIETY   Yes Barton Fanny, MD  citalopram (CELEXA) 20 MG tablet Take 2 tablets every night for depression.   Yes Barton Fanny, MD  hydrochlorothiazide (HYDRODIURIL)  12.5 MG tablet Take 1 tablet (12.5 mg total) by mouth daily.   Yes Barton Fanny, MD  levothyroxine (SYNTHROID, LEVOTHROID) 175 MCG tablet Take 1 tablet (175 mcg total) by mouth daily before breakfast. 11/12/13  Yes Barton Fanny, MD  metFORMIN (GLUCOPHAGE) 500 MG tablet TAKE ONE TABLET TWICE DAILY FOR DIABETES   Yes Barton Fanny, MD  metoprolol (LOPRESSOR) 100 MG tablet TAKE ONE TABLET BY MOUTH TWICE DAILY   Yes Barton Fanny, MD  pravastatin (PRAVACHOL) 20 MG tablet Take 1 tablet by mouth at bedtime. 11/12/13  Yes Barton Fanny, MD           History   Social History  . Marital Status: Legally Separated    Spouse Name: N/A    Number of Children: N/A  . Years of Education: N/A   Occupational History  . Not on file.   Social History Main Topics  . Smoking status: Current Some Day Smoker    Types: Cigarettes  . Smokeless tobacco: Never Used  . Alcohol Use: No  . Drug Use: No  . Sexual Activity: No     Comment: 1 partner in last year   Other Topics Concern  . Not on file   Social History Narrative  . No narrative on file    Family History  Problem Relation Age of Onset  . Hypertension Mother   . Heart disease Mother   . Diabetes Brother   . Hypertension Maternal Grandmother   . Heart disease Maternal Grandmother   . Diabetes Maternal Grandfather   .  Diabetes Paternal Grandfather     ROS: As per HPI.   O: Filed Vitals:   02/16/14 0839  BP: 128/76  Pulse: 89  Temp: 98.4 F (36.9 C)  Resp: 16    GEN: In NAD; WD,WD. HENT: [place]/AT; EOMI w/ clear conj/sclerae. Ext ears/nose/oroph unremarkable. COR: RRR. LUNGS: Unlabored resp. SKIN: W&D; intact w/o erythema, rash or pallor. MS: Lower legs- trace nonpitting edema; tender/ moderate palpation. NEURO: A&O x 3 ; Cns intact. Nonfocal. PSYCH: Pleasant demeanor, calm and attentive. SPeech pattern and thought content normal. Judgement appropriate.   A/P: Type II or unspecified type  diabetes mellitus without mention of complication, not stated as uncontrolled - Controlled on current medication w/ weight loss. Plan: HM Diabetes Foot Exam, COMPLETE METABOLIC PANEL WITH GFR  Essential hypertension - Stable on current medications. Plan: hydrochlorothiazide (HYDRODIURIL) 12.5 MG tablet, COMPLETE METABOLIC PANEL WITH GFR  Pain in joint, lower leg, unspecified laterality- D/C Tramadol. Trial Diclofenac bid w/ meals. Trial B-vitamin complex bid. Advised a OTC topical analgesic  Anxiety and depression- Continue Citalopram 40 mg hs.  Need for prophylactic vaccination against Streptococcus pneumoniae (pneumococcus) - Plan: Pneumococcal conjugate vaccine 13-valent IM   Meds ordered this encounter  Medications  . citalopram (CELEXA) 20 MG tablet    Sig: Take 2 tablets every night for depression.    Dispense:  60 tablet    Refill:  11  . hydrochlorothiazide (HYDRODIURIL) 12.5 MG tablet    Sig: Take 1 tablet (12.5 mg total) by mouth daily.    Dispense:  90 tablet    Refill:  3  . metFORMIN (GLUCOPHAGE) 500 MG tablet    Sig: TAKE ONE TABLET TWICE DAILY FOR DIABETES    Dispense:  60 tablet    Refill:  11  . metoprolol (LOPRESSOR) 100 MG tablet    Sig: TAKE ONE TABLET BY MOUTH TWICE DAILY    Dispense:  60 tablet    Refill:  11  . l-methylfolate-B6-B12 (METANX) 3-35-2 MG TABS    Sig: Take 1 tablet by mouth twice a day.    Dispense:  60 tablet    Refill:  11  . diclofenac (VOLTAREN) 75 MG EC tablet    Sig: Take 1 tablet twice a day with meal or snack.    Dispense:  60 tablet    Refill:  5

## 2014-02-17 ENCOUNTER — Other Ambulatory Visit: Payer: Self-pay | Admitting: Family Medicine

## 2014-02-17 NOTE — Progress Notes (Signed)
Quick Note:  Please advise pt regarding following labs...   All your labs look good. I will see you as scheduled in December. Continue taking all current medications.  Copy to pt. ______

## 2014-02-18 ENCOUNTER — Other Ambulatory Visit: Payer: Self-pay | Admitting: Family Medicine

## 2014-02-18 MED ORDER — ALPRAZOLAM 1 MG PO TABS: ORAL_TABLET | ORAL | Status: DC

## 2014-03-23 ENCOUNTER — Other Ambulatory Visit: Payer: Self-pay | Admitting: Family Medicine

## 2014-03-24 ENCOUNTER — Telehealth: Payer: Self-pay

## 2014-03-24 ENCOUNTER — Other Ambulatory Visit: Payer: Self-pay | Admitting: Family Medicine

## 2014-03-24 NOTE — Telephone Encounter (Signed)
Daughter called to check on status of metformin.  She will contact [facility] on [address] and request they send [phone]

## 2014-03-28 NOTE — Telephone Encounter (Signed)
This was sent in on 10/22

## 2014-04-04 ENCOUNTER — Encounter: Payer: Self-pay | Admitting: Family Medicine

## 2014-04-13 ENCOUNTER — Ambulatory Visit (INDEPENDENT_AMBULATORY_CARE_PROVIDER_SITE_OTHER): Payer: Self-pay | Admitting: Women's Health

## 2014-04-13 ENCOUNTER — Telehealth: Payer: Self-pay | Admitting: *Deleted

## 2014-04-13 ENCOUNTER — Encounter: Payer: Self-pay | Admitting: Women's Health

## 2014-04-13 VITALS — BP 148/84 | Ht 61.0 in | Wt 206.0 lb

## 2014-04-13 DIAGNOSIS — N950 Postmenopausal bleeding: Secondary | ICD-10-CM

## 2014-04-13 NOTE — Progress Notes (Signed)
Patient ID: Virginia Romero, female   DOB: 11/01/1955, 58 y.o.   MRN: [id number] Presents with complaint of postmenopausal bleeding for 1 day, last cycle 4 years ago. Bright red painless spotting , denies abdominal pain, cramping, vaginal discharge or urinary symptoms. No HRT. Not sexually active. Struggles with severe depression, hypertension, diabetes.  Exam: appears depressed, reports fearful stating something may be wrong. Abdomen soft, obese. External genitalia within normal limits, speculum exam moderate amount of the red menses type blood. Bimanual nontender, abdomen obese, difficult to palpate size,.no adnexal fullness or tenderness. Dr. Phineas Real consulted.  Exam: Repeated and as above.  Discussed situation with patient and recommended endometrial sampling.  Patient agrees.  EMB performed, sterile technique, easy catheterization, good sample return.  Patient tolerated well.  Dr Loetta Rough  Postmenopausal bleeding  Plan: Await pathology triage based on results.

## 2014-04-13 NOTE — Telephone Encounter (Signed)
Pt called c/o vaginal bleeding this am, not cycle in 3 years. Pt advised OV best.

## 2014-04-14 ENCOUNTER — Telehealth: Payer: Self-pay

## 2014-04-14 MED ORDER — HYDROCODONE-ACETAMINOPHEN 10-325 MG PO TABS: ORAL_TABLET | ORAL | Status: DC

## 2014-04-14 NOTE — Telephone Encounter (Signed)
I phoned pt to discuss knee pain. She has known severe DJD in both knees (plains films confirm this June 2014). She will be eligible for insurance through [employer] in Jan 2016. I will prescribe HC-APAP once but pt advised that she will have to be evaluated for additional refills. She will pick up RX at [facility] on Friday.

## 2014-04-14 NOTE — Telephone Encounter (Signed)
Pt of Dr. Sharin Grave states that she can hardly mover her left leg due to pain. She is on tramadol, but does not feel like it does anything for the pain. She states she had a GYN appt. Hassie Bruce she was in so much pain she could barely get through her appt. She is currently does not have the money to come in for an ov due to being on disability and her insurance, but would like to know if Dr. Leward Quan could call something in for her

## 2014-04-16 ENCOUNTER — Other Ambulatory Visit: Payer: Self-pay | Admitting: Family Medicine

## 2014-04-18 ENCOUNTER — Telehealth: Payer: Self-pay | Admitting: Women's Health

## 2014-04-18 DIAGNOSIS — N938 Other specified abnormal uterine and vaginal bleeding: Secondary | ICD-10-CM

## 2014-04-18 NOTE — Telephone Encounter (Signed)
TC, reviewed Dr Zelphia Cairo rec to do [facility] , will have insurance in Jan, will sched for first week of Jan. with Dr Phineas Real.  Instructed to call if any bleeding prior. Will call office to sched.

## 2014-04-18 NOTE — Telephone Encounter (Signed)
-----   Message from Anastasio Auerbach, MD sent at 04/18/2014  2:38 PM EST ----- [name], pathology was benign but did show fragments of polyp. I think for completeness the patient needs to have a sonohysterogram. We can make some financial adjustments for this and only charge her for a regular ultrasound but the extent of how big the polyp is etc. Is really not known based on the biopsy. Recommend this to her and whether she proceeds with this is up to her.

## 2014-04-18 NOTE — Telephone Encounter (Signed)
-----   Message from Anastasio Auerbach, MD sent at 04/18/2014  2:38 PM EST ----- Virginia Romero, pathology was benign but did show fragments of polyp. I think for completeness the patient needs to have a sonohysterogram. We can make some financial adjustments for this and only charge her for a regular ultrasound but the extent of how big the polyp is etc. Is really not known based on the biopsy. Recommend this to her and whether she proceeds with this is up to her.

## 2014-04-19 ENCOUNTER — Telehealth: Payer: Self-pay | Admitting: *Deleted

## 2014-04-19 NOTE — Telephone Encounter (Signed)
Pt scheduled for [facility] on 06/13/14 pt hear that is you have had a C-Seation a [facility] is not recommended? I told pt I was unsure of this and I would check with you. Please advise

## 2014-04-21 ENCOUNTER — Telehealth: Payer: Self-pay

## 2014-04-21 MED ORDER — ALPRAZOLAM 1 MG PO TABS: ORAL_TABLET | ORAL | Status: DC

## 2014-04-21 NOTE — Telephone Encounter (Signed)
Pt of Dr. Leward Quan states that she does not have another refill for xanax until the 23rd, but she will be out of town  For Thanksgiving, would like to know if she could have an early refill? Also, her therapist thinks she needs to increase her dosage.

## 2014-04-21 NOTE — Telephone Encounter (Signed)
Alprazolam refilled w/ dose change; I phoned med to pharmacy and left a message on pt's voicemail about medication.

## 2014-05-18 ENCOUNTER — Ambulatory Visit: Payer: BC Managed Care – PPO | Admitting: Family Medicine

## 2014-05-20 ENCOUNTER — Telehealth: Payer: Self-pay

## 2014-05-20 NOTE — Telephone Encounter (Signed)
Patient requesting refill on "Hydrocodone". She stated she tries really hard not to take this medicine but advil does not help her with her knee/leg pain. Patient also stated she is unable at this time to come in for an appointment. She will not have any insurance till January. Please call patient when ready to be picked up at [phone]

## 2014-05-23 MED ORDER — HYDROCODONE-ACETAMINOPHEN 10-325 MG PO TABS: ORAL_TABLET | ORAL | Status: DC

## 2014-05-23 NOTE — Telephone Encounter (Signed)
Hydrocodone- APAP prescription printed and at [facility] for pick-up on Tuesday, 05/24/2014.  Signature required.

## 2014-05-24 NOTE — Telephone Encounter (Signed)
Called to advise.  

## 2014-06-06 ENCOUNTER — Other Ambulatory Visit: Payer: Self-pay | Admitting: Gynecology

## 2014-06-06 DIAGNOSIS — N938 Other specified abnormal uterine and vaginal bleeding: Secondary | ICD-10-CM

## 2014-06-09 ENCOUNTER — Encounter: Payer: Self-pay | Admitting: Family Medicine

## 2014-06-09 ENCOUNTER — Ambulatory Visit (INDEPENDENT_AMBULATORY_CARE_PROVIDER_SITE_OTHER): Payer: 59 | Admitting: Family Medicine

## 2014-06-09 ENCOUNTER — Other Ambulatory Visit: Payer: Self-pay | Admitting: Gynecology

## 2014-06-09 VITALS — BP 186/94 | HR 60 | Temp 97.4°F | Resp 16 | Ht 62.25 in | Wt 202.8 lb

## 2014-06-09 DIAGNOSIS — E1169 Type 2 diabetes mellitus with other specified complication: Secondary | ICD-10-CM

## 2014-06-09 DIAGNOSIS — E785 Hyperlipidemia, unspecified: Secondary | ICD-10-CM

## 2014-06-09 DIAGNOSIS — M170 Bilateral primary osteoarthritis of knee: Secondary | ICD-10-CM

## 2014-06-09 DIAGNOSIS — N841 Polyp of cervix uteri: Secondary | ICD-10-CM

## 2014-06-09 DIAGNOSIS — F418 Other specified anxiety disorders: Secondary | ICD-10-CM

## 2014-06-09 DIAGNOSIS — F32A Depression, unspecified: Secondary | ICD-10-CM

## 2014-06-09 DIAGNOSIS — I10 Essential (primary) hypertension: Secondary | ICD-10-CM

## 2014-06-09 DIAGNOSIS — N950 Postmenopausal bleeding: Secondary | ICD-10-CM

## 2014-06-09 DIAGNOSIS — F419 Anxiety disorder, unspecified: Secondary | ICD-10-CM

## 2014-06-09 DIAGNOSIS — F329 Major depressive disorder, single episode, unspecified: Secondary | ICD-10-CM

## 2014-06-09 LAB — POCT GLYCOSYLATED HEMOGLOBIN (HGB A1C): HEMOGLOBIN A1C: 6

## 2014-06-09 MED ORDER — HYDROCHLOROTHIAZIDE 12.5 MG PO TABS: 12.5000 mg | ORAL_TABLET | Freq: Every day | ORAL | Status: DC

## 2014-06-09 MED ORDER — PRAVASTATIN SODIUM 40 MG PO TABS: 40.0000 mg | ORAL_TABLET | Freq: Every day | ORAL | Status: DC

## 2014-06-09 MED ORDER — DICLOFENAC SODIUM 75 MG PO TBEC: DELAYED_RELEASE_TABLET | ORAL | Status: DC

## 2014-06-09 NOTE — Progress Notes (Signed)
Subjective:    Patient ID: Virginia Romero, female    DOB: 09-May-1956, 59 y.o.   MRN: [id number]  HPI  This 59 y.o. Female is here for follow-up. She has Type II DM which she thinks is under good control; not on a good meal plan, usually eating one meal daily with snacks. Is taking metformin bid as prescribed; no reported hypoglycemia. Inconsistent w/ FSBS monitoring.  Anxiety and depression- Sees therapist regularly and takes medication as prescribed- Citalopram 40 mg daily w/o adverse effects; needs Alprazolam to get good restful sleep. She is anxious about disability; has been approved for long-term disablility but needs another letter re: current health status. Home issues- kitchen floor fell in and one of her dogs is 51 years old and in poor health.  HTN- pt reports she is out of diuretic but denies SOB, cough, leg edema.  DJD- Known to have severe DJD of both knees. Having a lot of L knee pain >> reduced ambulation (w/ a cane). Takes hydrocodone- APAP sparingly. Is out of Diclofenac.  Patient Active Problem List   Diagnosis Date Noted  . Pain in joint, lower leg 02/16/2014  . DM type 2 with diabetic dyslipidemia 09/08/2013  . Type II or unspecified type diabetes mellitus without mention of complication, not stated as uncontrolled 09/08/2013  . Diabetic eye exam 08/25/2013  . DJD (degenerative joint disease) of knee 11/19/2012  . Obesity, unspecified 11/12/2012  . Anxiety and depression 06/25/2012  . Hypertension 06/25/2012  . Hypothyroid 06/25/2012    Prior to Admission medications   Medication Sig Start Date End Date Taking? Authorizing Provider  ALPRAZolam Duanne Moron) 1 MG tablet TAKE 1 TABLET TWICE A DAY AS NEEDED FOR ANXIETY and/or SLEEP. 04/21/14  Yes Barton Fanny, MD  citalopram (CELEXA) 20 MG tablet Take 2 tablets every night for depression. 02/16/14  Yes Barton Fanny, MD  HYDROcodone-acetaminophen [facility]) 10-325 MG per tablet Take 1/2 tablet by mouth every 4  hours prn pain. 05/23/14  Yes Barton Fanny, MD  levothyroxine (SYNTHROID, LEVOTHROID) 125 MCG tablet TAKE ONE TABLET BY MOUTH ONE TIME DAILY  04/18/14  Yes Barton Fanny, MD  levothyroxine (SYNTHROID, LEVOTHROID) 175 MCG tablet Take 1 tablet (175 mcg total) by mouth daily before breakfast. 11/12/13  Yes Barton Fanny, MD  metFORMIN (GLUCOPHAGE) 500 MG tablet TAKE ONE TABLET TWICE DAILY FOR DIABETES 02/16/14  Yes Barton Fanny, MD  metoprolol (LOPRESSOR) 100 MG tablet TAKE ONE TABLET BY MOUTH TWICE DAILY 02/16/14  Yes Barton Fanny, MD  diclofenac (VOLTAREN) 75 MG EC tablet Take 1 tablet twice a day with meal or snack.    Barton Fanny, MD  hydrochlorothiazide (HYDRODIURIL) 12.5 MG tablet Take 1 tablet (12.5 mg total) by mouth daily.    Barton Fanny, MD  pravastatin (PRAVACHOL) 20 MG tablet Take 1 tablet (20 mg total) by mouth daily.   Yes Barton Fanny, MD    History   Social History  . Marital Status: Legally Separated    Spouse Name: N/A    Number of Children: N/A  . Years of Education: N/A   Occupational History  . Not on file.   Social History Main Topics  . Smoking status: Current Some Day Smoker    Types: Cigarettes  . Smokeless tobacco: Never Used  . Alcohol Use: No  . Drug Use: No  . Sexual Activity: No     Comment: 1 partner in last year   Other Topics  Concern  . Not on file   Social History Narrative    Review of Systems  Constitutional: Positive for activity change, appetite change and fatigue. Negative for fever, diaphoresis and unexpected weight change.  Eyes: Positive for visual disturbance.  Respiratory: Negative.   Cardiovascular: Negative.   Musculoskeletal: Positive for back pain, arthralgias and gait problem. Negative for myalgias.  Neurological: Negative.   Psychiatric/Behavioral: Positive for sleep disturbance and dysphoric mood. Negative for suicidal ideas, confusion, self-injury, decreased concentration  and agitation. The patient is nervous/anxious.         Objective:   Physical Exam  Constitutional: She is oriented to person, place, and time. She appears well-developed and well-nourished. No distress.  HENT:  Head: Normocephalic and atraumatic.  Left Ear: External ear normal.  Nose: Nose normal.  Mouth/Throat: Oropharynx is clear and moist.  Eyes: Conjunctivae and EOM are normal. Pupils are equal, round, and reactive to light. No scleral icterus.  Cardiovascular: Normal rate, regular rhythm and normal heart sounds.  Exam reveals no gallop.   No murmur heard. Pulmonary/Chest: Effort normal. No respiratory distress.  Musculoskeletal: She exhibits no edema.       Right knee: She exhibits decreased range of motion and deformity. She exhibits no swelling, no effusion, no erythema, normal alignment and no bony tenderness. No tenderness found.       Left knee: She exhibits decreased range of motion, deformity, abnormal alignment and bony tenderness. She exhibits no effusion. Tenderness found.  Neurological: She is alert and oriented to person, place, and time. No cranial nerve deficit. Coordination normal.  Skin: Skin is warm and dry. She is not diaphoretic. No pallor.  Psychiatric: Her speech is normal and behavior is normal. Judgment and thought content normal. Her mood appears not anxious. Her affect is not angry, not labile and not inappropriate. Cognition and memory are normal.  Flat affect. PHQ-9 score=7.  Nursing note and vitals reviewed.   Results for orders placed or performed in visit on 06/09/14  POCT glycosylated hemoglobin (Hb A1C)  Result Value Ref Range   Hemoglobin A1C 6.0        Assessment & Plan:  DM type 2 with diabetic dyslipidemia - Stable and well controlled; advised taking metformin only when consuming main meals. Pt typically eats one normal meal daily so she will take metformin with that meal; if she has a substantial 2nd meal during the day, she will take 2nd  metformin. I advised about concern re: hypoglycemia. She understands. Discussed lipid disorder; will increase statin to 40 mg hs. Check lipids at next visit (pt declined venipuncture today). Plan: POCT glycosylated hemoglobin (Hb A1C)  Anxiety and depression- Continue current medications and counseling. Will prepare a letter for pt to take to Disability hearing at end of this month.  Essential hypertension - Resume HCTZ; encouraged DASH diet. Plan: hydrochlorothiazide (HYDRODIURIL) 12.5 MG tablet  Primary osteoarthritis of both knees- Resume Diclofenac and take HC-APAP prn before pain becomes severe.  Meds ordered this encounter  Medications  . hydrochlorothiazide (HYDRODIURIL) 12.5 MG tablet    Sig: Take 1 tablet (12.5 mg total) by mouth daily.    Dispense:  90 tablet    Refill:  3  . diclofenac (VOLTAREN) 75 MG EC tablet    Sig: Take 1 tablet twice a day with meal or snack.    Dispense:  60 tablet    Refill:  5  . pravastatin (PRAVACHOL) 40 MG tablet    Sig: Take 1 tablet (40 mg total)  by mouth daily.    Dispense:  90 tablet    Refill:  1    Pneumococcal vaccine-23 should be given at next visit.

## 2014-06-09 NOTE — Patient Instructions (Signed)
You have medication refills to last until next visit in 3 months. Alprazolam probably needs to be refilled before next time you see me. You can have the pharmacy contact the clinic about refills. Next visit will be a lab visit.   Your Diabetes is very well controlled. COotinue current medications and try to stay focused on good nutrition and staying positive!  I will prepare a letter for you and it will be mailed to your before the end of this  Month.

## 2014-06-13 ENCOUNTER — Ambulatory Visit (INDEPENDENT_AMBULATORY_CARE_PROVIDER_SITE_OTHER): Payer: 59

## 2014-06-13 ENCOUNTER — Ambulatory Visit (INDEPENDENT_AMBULATORY_CARE_PROVIDER_SITE_OTHER): Payer: 59 | Admitting: Gynecology

## 2014-06-13 ENCOUNTER — Other Ambulatory Visit: Payer: Self-pay | Admitting: Gynecology

## 2014-06-13 ENCOUNTER — Encounter: Payer: Self-pay | Admitting: Gynecology

## 2014-06-13 DIAGNOSIS — N840 Polyp of corpus uteri: Secondary | ICD-10-CM

## 2014-06-13 DIAGNOSIS — N950 Postmenopausal bleeding: Secondary | ICD-10-CM

## 2014-06-13 DIAGNOSIS — N938 Other specified abnormal uterine and vaginal bleeding: Secondary | ICD-10-CM

## 2014-06-13 DIAGNOSIS — N8300 Follicular cyst of ovary, unspecified side: Secondary | ICD-10-CM

## 2014-06-13 DIAGNOSIS — N841 Polyp of cervix uteri: Secondary | ICD-10-CM

## 2014-06-13 NOTE — Patient Instructions (Signed)
Call if you have any further bleeding.

## 2014-06-13 NOTE — Progress Notes (Signed)
Virginia Romero 09-22-1955 [id number]        59 y.o.  G2P2 patient presents for sonohysterogram.  Had episode postmenopausal bleeding in November. Virginia Romero did an endometrial biopsy which showed: FRAGMENTS OF BENIGN ENDOMETRIAL POLYP; NEGATIVE FOR ATYPIA OR MALIGNANCY. - SEPARATE FRAGMENTS OF PROLIFERATIVE PHASE ENDOMETRIUM WITH FEATURES OF BREAKDOWN; NEGATIVE FOR HYPERPLASIA OR MALIGNANCY. Notes no further bleeding since then. We recommended a sonohysterogram to rule out any remaining polyp  Past medical history,surgical history, problem list, medications, allergies, family history and social history were all reviewed and documented in the EPIC chart.  Directed ROS with pertinent positives and negatives documented in the history of present illness/assessment and plan.  Exam: [place] assistant General appearance:  Normal Abdomen soft nontender without masses guarding rebound Pelvic external BUS vagina with atrophic changes. Cervix atrophic. Uterus is normal difficult to palpate. Adnexa without gross masses or tenderness  Ultrasound shows uterus small heterogeneous echotexture. Endometrial echo 2.7 mm. Right ovary with thin walled, avascular echo-free cyst 11 x 9 mm. Left adnexa is negative.  Sonohysterogram performed, sterile technique, easy catheter introduction, good distention with no abnormalities. No biopsy taken  Assessment/Plan:  59 y.o. G2P2 with episode of postmenopausal bleeding. Endometrial biopsy showed endometrial polyp. Sonohysterogram now shows empty thin endometrial cavity. Small cyst right ovary.  Prior ultrasound 2012 showed similar cyst measuring 14 x 9 mm. Recommend observation at present and report any further vaginal bleeding. Patient agrees with the plan.     Anastasio Auerbach MD, 9:04 AM 06/13/2014

## 2014-06-18 ENCOUNTER — Other Ambulatory Visit: Payer: Self-pay | Admitting: Family Medicine

## 2014-06-27 ENCOUNTER — Telehealth: Payer: Self-pay

## 2014-06-27 NOTE — Telephone Encounter (Signed)
Patient states she needs a letter for her attorney for disability. She states that she has already talked to you about this and you are aware of what she needs.

## 2014-06-28 ENCOUNTER — Encounter: Payer: Self-pay | Admitting: Family Medicine

## 2014-06-28 DIAGNOSIS — Z0271 Encounter for disability determination: Secondary | ICD-10-CM

## 2014-06-28 NOTE — Telephone Encounter (Signed)
Letter will be ready for pick-up from [address] on Wednesday Jun 29, 2014. I cannot recall if pt wanted letter mailed to her but I recommend she pick it up to be sure she has it by end of this week.

## 2014-07-07 ENCOUNTER — Other Ambulatory Visit (HOSPITAL_COMMUNITY)
Admission: RE | Admit: 2014-07-07 | Discharge: 2014-07-07 | Disposition: A | Discharge status: Home / Self Care | Payer: 59 | Source: Ambulatory Visit | Attending: Gynecology | Admitting: Gynecology

## 2014-07-07 ENCOUNTER — Encounter: Payer: Self-pay | Admitting: Women's Health

## 2014-07-07 ENCOUNTER — Ambulatory Visit (INDEPENDENT_AMBULATORY_CARE_PROVIDER_SITE_OTHER): Payer: 59 | Admitting: Women's Health

## 2014-07-07 VITALS — BP 160/80 | Ht 62.0 in | Wt 202.0 lb

## 2014-07-07 DIAGNOSIS — Z01419 Encounter for gynecological examination (general) (routine) without abnormal findings: Secondary | ICD-10-CM

## 2014-07-07 LAB — URINALYSIS W MICROSCOPIC + REFLEX CULTURE
BACTERIA UA: NONE SEEN
Bilirubin Urine: NEGATIVE
CRYSTALS: NONE SEEN
Casts: NONE SEEN
Glucose, UA: NEGATIVE mg/dL
Hgb urine dipstick: NEGATIVE
Ketones, ur: NEGATIVE mg/dL
Leukocytes, UA: NEGATIVE
Nitrite: NEGATIVE
Protein, ur: NEGATIVE mg/dL
SQUAMOUS EPITHELIAL / LPF: NONE SEEN
Specific Gravity, Urine: 1.007 (ref 1.005–1.030)
UROBILINOGEN UA: 0.2 mg/dL (ref 0.0–1.0)
pH: 6 (ref 5.0–8.0)

## 2014-07-07 NOTE — Patient Instructions (Signed)
Health Recommendations for Postmenopausal Women Respected and ongoing research has looked at the most common causes of death, disability, and poor quality of life in postmenopausal women. The causes include heart disease, diseases of blood vessels, diabetes, depression, cancer, and bone loss (osteoporosis). Many things can be done to help lower the chances of developing these and other common problems. CARDIOVASCULAR DISEASE Heart Disease: A heart attack is a medical emergency. Know the signs and symptoms of a heart attack. Below are things women can do to reduce their risk for heart disease.   Do not smoke. If you smoke, quit.  Aim for a healthy weight. Being overweight causes many preventable deaths. Eat a healthy and balanced diet and drink an adequate amount of liquids.  Get moving. Make a commitment to be more physically active. Aim for 30 minutes of activity on most, if not all days of the week.  Eat for heart health. Choose a diet that is low in saturated fat and cholesterol and eliminate trans fat. Include whole grains, vegetables, and fruits. Read and understand the labels on food containers before buying.  Know your numbers. Ask your caregiver to check your blood pressure, cholesterol (total, HDL, LDL, triglycerides) and blood glucose. Work with your caregiver on improving your entire clinical picture.  High blood pressure. Limit or stop your table salt intake (try salt substitute and food seasonings). Avoid salty foods and drinks. Read labels on food containers before buying. Eating well and exercising can help control high blood pressure. STROKE  Stroke is a medical emergency. Stroke may be the result of a blood clot in a blood vessel in the brain or by a brain hemorrhage (bleeding). Know the signs and symptoms of a stroke. To lower the risk of developing a stroke:  Avoid fatty foods.  Quit smoking.  Control your diabetes, blood pressure, and irregular heart rate. THROMBOPHLEBITIS  (BLOOD CLOT) OF THE LEG  Becoming overweight and leading a stationary lifestyle may also contribute to developing blood clots. Controlling your diet and exercising will help lower the risk of developing blood clots. CANCER SCREENING  Breast Cancer: Take steps to reduce your risk of breast cancer.  You should practice "breast self-awareness." This means understanding the normal appearance and feel of your breasts and should include breast self-examination. Any changes detected, no matter how small, should be reported to your caregiver.  After age 40, you should have a clinical breast exam (CBE) every year.  Starting at age 40, you should consider having a mammogram (breast X-ray) every year.  If you have a family history of breast cancer, talk to your caregiver about genetic screening.  If you are at high risk for breast cancer, talk to your caregiver about having an MRI and a mammogram every year.  Intestinal or Stomach Cancer: Tests to consider are a rectal exam, fecal occult blood, sigmoidoscopy, and colonoscopy. Women who are high risk may need to be screened at an earlier age and more often.  Cervical Cancer:  Beginning at age 30, you should have a Pap test every 3 years as long as the past 3 Pap tests have been normal.  If you have had past treatment for cervical cancer or a condition that could lead to cancer, you need Pap tests and screening for cancer for at least 20 years after your treatment.  If you had a hysterectomy for a problem that was not cancer or a condition that could lead to cancer, then you no longer need Pap tests.    If you are between ages 65 and 70, and you have had normal Pap tests going back 10 years, you no longer need Pap tests.  If Pap tests have been discontinued, risk factors (such as a new sexual partner) need to be reassessed to determine if screening should be resumed.  Some medical problems can increase the chance of getting cervical cancer. In these  cases, your caregiver may recommend more frequent screening and Pap tests.  Uterine Cancer: If you have vaginal bleeding after reaching menopause, you should notify your caregiver.  Ovarian Cancer: Other than yearly pelvic exams, there are no reliable tests available to screen for ovarian cancer at this time except for yearly pelvic exams.  Lung Cancer: Yearly chest X-rays can detect lung cancer and should be done on high risk women, such as cigarette smokers and women with chronic lung disease (emphysema).  Skin Cancer: A complete body skin exam should be done at your yearly examination. Avoid overexposure to the sun and ultraviolet light lamps. Use a strong sun block cream when in the sun. All of these things are important for lowering the risk of skin cancer. MENOPAUSE Menopause Symptoms: Hormone therapy products are effective for treating symptoms associated with menopause:  Moderate to severe hot flashes.  Night sweats.  Mood swings.  Headaches.  Tiredness.  Loss of sex drive.  Insomnia.  Other symptoms. Hormone replacement carries certain risks, especially in older women. Women who use or are thinking about using estrogen or estrogen with progestin treatments should discuss that with their caregiver. Your caregiver will help you understand the benefits and risks. The ideal dose of hormone replacement therapy is not known. The Food and Drug Administration (FDA) has concluded that hormone therapy should be used only at the lowest doses and for the shortest amount of time to reach treatment goals.  OSTEOPOROSIS Protecting Against Bone Loss and Preventing Fracture If you use hormone therapy for prevention of bone loss (osteoporosis), the risks for bone loss must outweigh the risk of the therapy. Ask your caregiver about other medications known to be safe and effective for preventing bone loss and fractures. To guard against bone loss or fractures, the following is recommended:  If  you are younger than age 50, take 1000 mg of calcium and at least 600 mg of Vitamin D per day.  If you are older than age 50 but younger than age 70, take 1200 mg of calcium and at least 600 mg of Vitamin D per day.  If you are older than age 70, take 1200 mg of calcium and at least 800 mg of Vitamin D per day. Smoking and excessive alcohol intake increases the risk of osteoporosis. Eat foods rich in calcium and vitamin D and do weight bearing exercises several times a week as your caregiver suggests. DIABETES Diabetes Mellitus: If you have type I or type 2 diabetes, you should keep your blood sugar under control with diet, exercise, and recommended medication. Avoid starchy and fatty foods, and too many sweets. Being overweight can make diabetes control more difficult. COGNITION AND MEMORY Cognition and Memory: Menopausal hormone therapy is not recommended for the prevention of cognitive disorders such as Alzheimer's disease or memory loss.  DEPRESSION  Depression may occur at any age, but it is common in elderly women. This may be because of physical, medical, social (loneliness), or financial problems and needs. If you are experiencing depression because of medical problems and control of symptoms, talk to your caregiver about this. Physical   activity and exercise may help with mood and sleep. Community and volunteer involvement may improve your sense of value and worth. If you have depression and you feel that the problem is getting worse or becoming severe, talk to your caregiver about which treatment options are best for you. ACCIDENTS  Accidents are common and can be serious in elderly woman. Prepare your house to prevent accidents. Eliminate throw rugs, place hand bars in bath, shower, and toilet areas. Avoid wearing high heeled shoes or walking on wet, snowy, and icy areas. Limit or stop driving if you have vision or hearing problems, or if you feel you are unsteady with your movements and  reflexes. HEPATITIS C Hepatitis C is a type of viral infection affecting the liver. It is spread mainly through contact with blood from an infected person. It can be treated, but if left untreated, it can lead to severe liver damage over the years. Many people who are infected do not know that the virus is in their blood. If you are a "baby-boomer", it is recommended that you have one screening test for Hepatitis C. IMMUNIZATIONS  Several immunizations are important to consider having during your senior years, including:   Tetanus, diphtheria, and pertussis booster shot.  Influenza every year before the flu season begins.  Pneumonia vaccine.  Shingles vaccine.  Others, as indicated based on your specific needs. Talk to your caregiver about these. Document Released: 07/12/2005 Document Revised: 10/04/2013 Document Reviewed: 03/07/2008 Exit[employer] Patient Information 2015 [employer]. This information is not intended to replace advice given to you by your health care provider. Make sure you discuss any questions you have with your health care provider.  

## 2014-07-07 NOTE — Addendum Note (Signed)
Addended by: Burnett Kanaris on: 07/07/2014 09:22 AM   Modules accepted: Orders, SmartSet

## 2014-07-07 NOTE — Progress Notes (Signed)
Virginia Romero 20-Jul-1955 [id number]    History:    Presents for annual exam.  Postmenopausal/no HRT. 04/2014 postmenopausal bleeding endometrial biopsy benign polyp, January 2016 sonohysterogram endometrium thin at 2.7 mm with no visible polyps. Normal Pap and mammogram history. 23-Sep-2003 benign colon polyp. Diabetes/hypertension/hypothyroidism primary care manages. 09/22/2012 DEXA T score -0.2 at hip without elevated FRAX. Severe anxiety and depression /posttraumatic stress - therapy and medication.  Past medical history, past surgical history, family history and social history were all reviewed and documented in the EPIC chart. Has 2 sons that are doing well and supportive. Unemployed has applied for disability. Had worked for [employer]. Ex-husband died of AIDS 09-22-2013 helped to care for him last year of his life. Has 2 dogs that are 16 and 17 which she is worried about.  ROS:  A ROS was performed and pertinent positives and negatives are included.  Exam:  Filed Vitals:   07/07/14 0803  BP: 160/80    General appearance:  Normal Thyroid:  Symmetrical, normal in size, without palpable masses or nodularity. Respiratory  Auscultation:  Clear without wheezing or rhonchi Cardiovascular  Auscultation:  Regular rate, without rubs, murmurs or gallops  Edema/varicosities:  Not grossly evident Abdominal  Soft,nontender, without masses, guarding or rebound.  Liver/spleen:  No organomegaly noted  Hernia:  None appreciated  Skin  Inspection:  Grossly normal   Breasts: Examined lying and sitting.     Right: Without masses, retractions, discharge or axillary adenopathy.     Left: Without masses, retractions, discharge or axillary adenopathy. Gentitourinary   Inguinal/mons:  Normal without inguinal adenopathy  External genitalia:  Normal  BUS/Urethra/Skene's glands:  Normal  Vagina:  Normal  Cervix:  Normal  Uterus:   normal in size, shape and contour.  Midline and mobile  Adnexa/parametria:      Rt: Without masses or tenderness.   Lt: Without masses or tenderness.  Anus and perineum: Normal  Digital rectal exam: Normal sphincter tone without palpated masses or tenderness  Assessment/Plan:  59 y.o. DWF G2P2 for annual exam struggling with anxiety/depression/post traumatic stress.  Postmenopausal/no HRT/no bleeding Diabetes/hypertension/hypothyroid/arthritis - primary care manages labs and meds  Plan: SBE's, overdue for mammogram instructed to schedule. Encouraged regular daily exercise, walking , vitamin D 2000, calcium rich diet and increasing leisure activities encouraged. Continue therapy and medications as prescribed. Home safety, fall prevention reviewed. Pap. New screening guidelines reviewed. Repeat colonoscopy, will schedule. Instructed to call if any further bleeding. Aware blood pressure elevated today will follow-up with primary care.  Huel Cote [facility], 9:01 AM 07/07/2014

## 2014-07-08 NOTE — Telephone Encounter (Signed)
Letter faxed.

## 2014-07-08 NOTE — Telephone Encounter (Signed)
Patient called to check the status of her letter. Previous phone message states that letter was ready for pick up on 06/29/2014. Patient was not notified. Patient now wants letter faxed to [phone]. Patient will come in next week to pick up the original copy at 104.

## 2014-07-11 LAB — CYTOLOGY - PAP

## 2014-08-04 ENCOUNTER — Encounter: Payer: Self-pay | Admitting: Women's Health

## 2014-08-05 ENCOUNTER — Other Ambulatory Visit: Payer: Self-pay | Admitting: Family Medicine

## 2014-08-05 NOTE — Telephone Encounter (Signed)
You saw pt in Jan for check up but don't see this med discussed. OK to RF?

## 2014-08-05 NOTE — Telephone Encounter (Signed)
I am refilling thyroid medication for 2 months but pt needs OV w/ me and to get labs checked.

## 2014-08-06 ENCOUNTER — Telehealth: Payer: Self-pay

## 2014-08-10 ENCOUNTER — Telehealth: Payer: Self-pay | Admitting: Family Medicine

## 2014-08-10 NOTE — Telephone Encounter (Signed)
Patient is calling to see if she has ever had x-rays done of her knee at our office before. States that social security is inquiring about this. Please advise.  [phone]

## 2014-08-11 NOTE — Telephone Encounter (Signed)
Advised pt that we did xrays back in 2014.  Made copy of reports for pt to pickup at 104.

## 2014-08-19 ENCOUNTER — Other Ambulatory Visit: Payer: Self-pay | Admitting: Family Medicine

## 2014-08-21 NOTE — Telephone Encounter (Signed)
Alprazolam refill phoned to pt's pharmacy. 

## 2014-09-08 ENCOUNTER — Ambulatory Visit: Payer: 59 | Admitting: Family Medicine

## 2014-09-17 ENCOUNTER — Other Ambulatory Visit: Payer: Self-pay | Admitting: Family Medicine

## 2014-09-19 NOTE — Telephone Encounter (Signed)
Alprazolam refill phoned to pt's pharmacy. 

## 2014-10-03 DIAGNOSIS — Z0271 Encounter for disability determination: Secondary | ICD-10-CM

## 2014-10-04 ENCOUNTER — Ambulatory Visit (INDEPENDENT_AMBULATORY_CARE_PROVIDER_SITE_OTHER): Payer: 59 | Admitting: Family Medicine

## 2014-10-04 ENCOUNTER — Encounter: Payer: Self-pay | Admitting: Family Medicine

## 2014-10-04 VITALS — BP 150/90 | HR 72 | Temp 99.0°F | Resp 16 | Ht 62.0 in | Wt 206.4 lb

## 2014-10-04 DIAGNOSIS — E785 Hyperlipidemia, unspecified: Secondary | ICD-10-CM | POA: Diagnosis not present

## 2014-10-04 DIAGNOSIS — F419 Anxiety disorder, unspecified: Secondary | ICD-10-CM

## 2014-10-04 DIAGNOSIS — E034 Atrophy of thyroid (acquired): Secondary | ICD-10-CM

## 2014-10-04 DIAGNOSIS — I10 Essential (primary) hypertension: Secondary | ICD-10-CM | POA: Diagnosis not present

## 2014-10-04 DIAGNOSIS — E038 Other specified hypothyroidism: Secondary | ICD-10-CM

## 2014-10-04 DIAGNOSIS — F329 Major depressive disorder, single episode, unspecified: Secondary | ICD-10-CM

## 2014-10-04 DIAGNOSIS — F32A Depression, unspecified: Secondary | ICD-10-CM

## 2014-10-04 DIAGNOSIS — M25562 Pain in left knee: Secondary | ICD-10-CM

## 2014-10-04 DIAGNOSIS — E1169 Type 2 diabetes mellitus with other specified complication: Secondary | ICD-10-CM | POA: Diagnosis not present

## 2014-10-04 DIAGNOSIS — F418 Other specified anxiety disorders: Secondary | ICD-10-CM | POA: Diagnosis not present

## 2014-10-04 DIAGNOSIS — E559 Vitamin D deficiency, unspecified: Secondary | ICD-10-CM

## 2014-10-04 LAB — POCT SEDIMENTATION RATE: POCT SED RATE: 46 mm/hr — AB (ref 0–22)

## 2014-10-04 LAB — POCT GLYCOSYLATED HEMOGLOBIN (HGB A1C): Hemoglobin A1C: 6

## 2014-10-04 MED ORDER — PRAVASTATIN SODIUM 40 MG PO TABS: 40.0000 mg | ORAL_TABLET | Freq: Every day | ORAL | Status: DC

## 2014-10-04 MED ORDER — LEVOTHYROXINE SODIUM 125 MCG PO TABS: 125.0000 ug | ORAL_TABLET | Freq: Every day | ORAL | Status: DC

## 2014-10-04 NOTE — Progress Notes (Signed)
Subjective:    Patient ID: Virginia Romero, female    DOB: Oct 14, 1955, 59 y.o.   MRN: [id number]  HPI  This 59 y.o. Female returns for follow-up of chronic medical issues which include HTN, Type II DM, hypothyroidism and depression w/ anxiety associated w/ PTSD. She is compliant w/ medications w/o adverse effects.  Type II DM - Jan 2016 A1c= 6.0%. Nutrition is consistent but pt not able to exercise much due to leg pan/knee DJD. Diclofenac is effective. No reported FSBS. Denies hypoglycemic symptoms.  HTN- Pt denies diaphoresis, vision disturbances, CP or tightness, palpitations, edema, SOB or DOE, cough, HA, dizziness, numbness, weakness or syncope.  Anxiety/depression- Pt is taking citalopram 40 mg daily; she is feeling sad about her two dogs, ages 42 and 77 years, w/ failing health. She is tearful as she discusses the possibility of having to "let them go". Her ex-husband died last year. She continues in counseling for depression (and PTSD). She remains on short-term disability pending decision re: permanent disability.   Hypothyroidism- Pt has chronic fatigue associated w/ her mental health issues as well as obesity. Weight is up a few pounds. No c/o skin or hair changes; no constipation but "just not feeling well".  Vitamin D def- Low Vit D in 2014; pt not taking a supplement. Also reports being Vitamin B12 def as a child; received injections.   Patient Active Problem List   Diagnosis Date Noted  . Pain in joint, lower leg 02/16/2014  . DM type 2 with diabetic dyslipidemia 09/08/2013  . Type II or unspecified type diabetes mellitus without mention of complication, not stated as uncontrolled 09/08/2013  . Diabetic eye exam 08/25/2013  . DJD (degenerative joint disease) of knee 11/19/2012  . Obesity, unspecified 11/12/2012  . Anxiety and depression 06/25/2012  . Hypertension 06/25/2012  . Hypothyroid 06/25/2012    Prior to Admission medications   Medication Sig Start Date End Date  Taking? Authorizing Provider  ALPRAZolam Duanne Moron) 1 MG tablet TAKE 1 TABLET TWICE DAILY AS NEEDED FOR ANXIETY AND SLEEP. 09/19/14  Yes Barton Fanny, MD  citalopram (CELEXA) 20 MG tablet Take 2 tablets every night for depression. 02/16/14  Yes Barton Fanny, MD  diclofenac (VOLTAREN) 75 MG EC tablet Take 1 tablet twice a day with meal or snack. 06/09/14  Yes Barton Fanny, MD  hydrochlorothiazide (HYDRODIURIL) 12.5 MG tablet Take 1 tablet (12.5 mg total) by mouth daily. 06/09/14  Yes Barton Fanny, MD  levothyroxine (SYNTHROID, LEVOTHROID) 125 MCG tablet Take 1 tablet (125 mcg total) by mouth daily.   Yes Barton Fanny, MD  metFORMIN (GLUCOPHAGE) 500 MG tablet TAKE ONE TABLET TWICE DAILY FOR DIABETES Patient taking differently: 250 mg. TAKE ONE TABLET TWICE DAILY FOR DIABETES 02/16/14  Yes Barton Fanny, MD  metoprolol (LOPRESSOR) 100 MG tablet TAKE ONE TABLET BY MOUTH TWICE DAILY 02/16/14  Yes Barton Fanny, MD  pravastatin (PRAVACHOL) 40 MG tablet Take 1 tablet (40 mg total) by mouth daily.   Yes Barton Fanny, MD  HYDROcodone-acetaminophen [facility]) 10-325 MG per tablet Take 1/2 tablet by mouth every 4 hours prn pain. Patient not taking: Reported on 10/04/2014 05/23/14   Barton Fanny, MD    SURG, SOC and [facility] HX reviewed.   Review of Systems As per HPI.     Objective:   Physical Exam  Constitutional: She is oriented to person, place, and time. She appears well-developed and well-nourished. No distress.  Blood pressure 150/90, pulse  72, temperature 99 F (37.2 C), temperature source Oral, resp. rate 16, height 5\' 2"  (1.575 m), weight 206 lb 6.4 oz (93.622 kg), SpO2 97 %.   HENT:  Head: Normocephalic and atraumatic.  Right Ear: External ear normal.  Left Ear: External ear normal.  Nose: Nose normal.  Mouth/Throat: Oropharynx is clear and moist.  Eyes: Conjunctivae and EOM are normal. No scleral icterus.  Cardiovascular: Normal rate and  regular rhythm.   Pulmonary/Chest: Effort normal. No respiratory distress.  Musculoskeletal:       Right knee: She exhibits decreased range of motion. She exhibits no swelling, no effusion and no erythema. Tenderness found.       Left knee: She exhibits decreased range of motion. She exhibits no swelling, no effusion and no erythema. Tenderness found.  Deformity of knees c/w DJD.  Neurological: She is alert and oriented to person, place, and time. No cranial nerve deficit. Coordination normal.  Skin: Skin is warm and dry. She is not diaphoretic. No pallor.  Psychiatric: Her speech is normal and behavior is normal. Judgment and thought content normal. Her mood appears not anxious. Her affect is not inappropriate. Cognition and memory are normal. She exhibits a depressed mood.  Tearful.  Nursing note and vitals reviewed.   Results for orders placed or performed in visit on 10/04/14  POCT glycosylated hemoglobin (Hb A1C)  Result Value Ref Range   Hemoglobin A1C 6.0        Assessment & Plan:  DM type 2 with diabetic dyslipidemia - Pt takes metformin 500 mg 1/2 tablet twice daily with meals. Control is excellent. Plan: COMPLETE METABOLIC PANEL WITH GFR, LDL Cholesterol, Direct, POCT glycosylated hemoglobin (Hb A1C)  Pain in joint, lower leg, left - DJD, in treatment w/ ORTHO. Continue Diclofenac. Plan: POCT SEDIMENTATION RATE  Essential hypertension - Stable on current medications; has had readings 120-140/ 70-90.  Pt upset today about pets and has pain w/ ambulation. Normal urine microalbumin in June 2014; consider adding low dose ACEI since BP hovers at or above 150/90. Plan: COMPLETE METABOLIC PANEL WITH GFR  Anxiety and depression - Continue current medications and counseling w/ Tanda Rockers.. Plan: Vitamin B12, Vitamin D, 25-hydroxy  Hypothyroidism due to acquired atrophy of thyroid - Continue levothyroxine 125 mcg 1 tab daily. Plan: TSH  Vitamin D deficiency - Plan: Vitamin D,  25-hydroxy   Meds ordered this encounter  Medications  . pravastatin (PRAVACHOL) 40 MG tablet    Sig: Take 1 tablet (40 mg total) by mouth daily.    Dispense:  90 tablet    Refill:  1  . levothyroxine (SYNTHROID, LEVOTHROID) 125 MCG tablet    Sig: Take 1 tablet (125 mcg total) by mouth daily.    Dispense:  30 tablet    Refill:  3

## 2014-10-04 NOTE — Patient Instructions (Addendum)
You need to schedule a follow-up visit in 3-4 months to establish care with Virginia Netters, FNP (nurse practitioner).  She can decide when she wants to see you again for a complete physical. Continue taking all current medications as prescribed. You should have some refills on all medications; if not, ask your pharmacy to contact us for additional refills.

## 2014-10-05 ENCOUNTER — Telehealth: Payer: Self-pay | Admitting: Family Medicine

## 2014-10-05 LAB — COMPLETE METABOLIC PANEL WITH GFR
ALK PHOS: 122 U/L — AB (ref 39–117)
ALT: 11 U/L (ref 0–35)
AST: 16 U/L (ref 0–37)
Albumin: 4.3 g/dL (ref 3.5–5.2)
BUN: 11 mg/dL (ref 6–23)
CO2: 25 mEq/L (ref 19–32)
CREATININE: 0.87 mg/dL (ref 0.50–1.10)
Calcium: 9.1 mg/dL (ref 8.4–10.5)
Chloride: 101 mEq/L (ref 96–112)
GFR, Est African American: 85 mL/min
GFR, Est Non African American: 74 mL/min
GLUCOSE: 83 mg/dL (ref 70–99)
Potassium: 4.1 mEq/L (ref 3.5–5.3)
Sodium: 140 mEq/L (ref 135–145)
Total Bilirubin: 0.3 mg/dL (ref 0.2–1.2)
Total Protein: 7.2 g/dL (ref 6.0–8.3)

## 2014-10-05 LAB — LDL CHOLESTEROL, DIRECT: Direct LDL: 151 mg/dL — ABNORMAL HIGH

## 2014-10-05 LAB — VITAMIN B12: Vitamin B-12: 307 pg/mL (ref 211–911)

## 2014-10-05 LAB — TSH: TSH: 8.817 u[IU]/mL — AB (ref 0.350–4.500)

## 2014-10-05 LAB — VITAMIN D 25 HYDROXY (VIT D DEFICIENCY, FRACTURES): VIT D 25 HYDROXY: 12 ng/mL — AB (ref 30–100)

## 2014-10-05 MED ORDER — LEVOTHYROXINE SODIUM 150 MCG PO TABS: 150.0000 ug | ORAL_TABLET | Freq: Every day | ORAL | Status: DC

## 2014-10-05 MED ORDER — VITAMIN D (ERGOCALCIFEROL) 1.25 MG (50000 UNIT) PO CAPS: 50000.0000 [IU] | ORAL_CAPSULE | ORAL | Status: DC

## 2014-10-05 MED ORDER — LISINOPRIL 2.5 MG PO TABS: 2.5000 mg | ORAL_TABLET | Freq: Every day | ORAL | Status: DC

## 2014-10-05 NOTE — Telephone Encounter (Signed)
Spoke w/ pt about her labs. Levothyroxine dose to low; will increase to 150 mcg 1 tab daily. Also Vitamin D def; will prescribe Vit D 50000 units 1 capsule once a week. Discussed adding lisinopril 2.5 mg 1 tablet daily to  Help lower BP and for reno-protective effect. She will sch a follow-up appt w/ D. Carlean Purl, FNP at which time TSH can be rechecked. Will need Vitamin D checked in ~12 weeks.

## 2014-10-13 ENCOUNTER — Telehealth: Payer: Self-pay

## 2014-10-13 NOTE — Telephone Encounter (Signed)
Patient left voicemail on Wednesday at 2:33pm stating that the social security office sent her to [facility] for leg x-rays and she wants to know if the x-rays were sent to Dr. Leward Quan. Cb# [phone].

## 2014-10-17 NOTE — Telephone Encounter (Signed)
Patient wants to know if we have received her x-ray report from Dr. Penelope Coop office   [phone]

## 2014-10-24 NOTE — Telephone Encounter (Signed)
No records have been sent over that I have seen as of 10/24/14, may need to call patient and let her know so she can call [facility].

## 2014-10-28 ENCOUNTER — Ambulatory Visit (INDEPENDENT_AMBULATORY_CARE_PROVIDER_SITE_OTHER): Payer: 59 | Admitting: Family Medicine

## 2014-10-28 ENCOUNTER — Encounter: Payer: Self-pay | Admitting: Family Medicine

## 2014-10-28 VITALS — BP 162/94 | HR 76 | Temp 98.1°F | Resp 16 | Ht 62.25 in | Wt 207.4 lb

## 2014-10-28 DIAGNOSIS — F419 Anxiety disorder, unspecified: Secondary | ICD-10-CM

## 2014-10-28 DIAGNOSIS — R21 Rash and other nonspecific skin eruption: Secondary | ICD-10-CM

## 2014-10-28 DIAGNOSIS — F32A Depression, unspecified: Secondary | ICD-10-CM

## 2014-10-28 DIAGNOSIS — F418 Other specified anxiety disorders: Secondary | ICD-10-CM | POA: Diagnosis not present

## 2014-10-28 DIAGNOSIS — E559 Vitamin D deficiency, unspecified: Secondary | ICD-10-CM | POA: Diagnosis not present

## 2014-10-28 DIAGNOSIS — F329 Major depressive disorder, single episode, unspecified: Secondary | ICD-10-CM

## 2014-10-28 DIAGNOSIS — I10 Essential (primary) hypertension: Secondary | ICD-10-CM

## 2014-10-28 MED ORDER — DICLOFENAC SODIUM 75 MG PO TBEC: DELAYED_RELEASE_TABLET | ORAL | Status: DC

## 2014-10-28 MED ORDER — SILVER SULFADIAZINE 1 % EX CREA: 1.0000 "application " | TOPICAL_CREAM | Freq: Every day | CUTANEOUS | Status: DC

## 2014-10-28 MED ORDER — VALACYCLOVIR HCL 1 G PO TABS: ORAL_TABLET | ORAL | Status: DC

## 2014-10-28 MED ORDER — VITAMIN D (ERGOCALCIFEROL) 1.25 MG (50000 UNIT) PO CAPS: 50000.0000 [IU] | ORAL_CAPSULE | ORAL | Status: DC

## 2014-11-01 ENCOUNTER — Encounter: Payer: Self-pay | Admitting: Family Medicine

## 2014-11-01 NOTE — Progress Notes (Signed)
S:  This 59 y.o female is here for evaluation of a blistering rash, onset 1 week ago. The rash started as small red papules that developed into small blisters. The rash is on both arms and itches. Pt denies fever/chills, HA, n/v or myalgias. She has some chronic fatigue.  Pt also needs refills on medications. She informs me that she has been awarded disability and it will be retroactive to the time that she first filed. She continues to struggle w/ depression and continues with counseling.  Patient Active Problem List   Diagnosis Date Noted  . Vitamin D deficiency 10/28/2014  . Pain in joint, lower leg 02/16/2014  . DM type 2 with diabetic dyslipidemia 09/08/2013  . Type II or unspecified type diabetes mellitus without mention of complication, not stated as uncontrolled 09/08/2013  . Diabetic eye exam 08/25/2013  . DJD (degenerative joint disease) of knee 11/19/2012  . Obesity, unspecified 11/12/2012  . Anxiety and depression 06/25/2012  . Hypertension 06/25/2012  . Hypothyroid 06/25/2012    Prior to Admission medications   Medication Sig Start Date End Date Taking? Authorizing Provider  ALPRAZolam Duanne Moron) 1 MG tablet TAKE 1 TABLET TWICE DAILY AS NEEDED FOR ANXIETY AND SLEEP. 09/19/14  Yes Barton Fanny, MD  citalopram (CELEXA) 20 MG tablet Take 2 tablets every night for depression. 02/16/14  Yes Barton Fanny, MD  diclofenac (VOLTAREN) 75 MG EC tablet Take 1 tablet twice a day with meal or snack.   Yes Barton Fanny, MD  hydrochlorothiazide (HYDRODIURIL) 12.5 MG tablet Take 1 tablet (12.5 mg total) by mouth daily. 06/09/14  Yes Barton Fanny, MD  levothyroxine (SYNTHROID, LEVOTHROID) 150 MCG tablet Take 1 tablet (150 mcg total) by mouth daily. 10/05/14  Yes Barton Fanny, MD  lisinopril (PRINIVIL,ZESTRIL) 2.5 MG tablet Take 1 tablet (2.5 mg total) by mouth daily. 10/05/14  Yes Barton Fanny, MD  metFORMIN (GLUCOPHAGE) 500 MG tablet TAKE ONE TABLET TWICE DAILY  FOR DIABETES Patient taking differently: 250 mg. TAKE ONE TABLET TWICE DAILY FOR DIABETES 02/16/14  Yes Barton Fanny, MD  metoprolol (LOPRESSOR) 100 MG tablet TAKE ONE TABLET BY MOUTH TWICE DAILY 02/16/14  Yes Barton Fanny, MD  pravastatin (PRAVACHOL) 40 MG tablet Take 1 tablet (40 mg total) by mouth daily. 10/04/14  Yes Barton Fanny, MD  Vitamin D, Ergocalciferol, (DRISDOL) 50000 UNITS CAPS capsule Take 1 capsule (50,000 Units total) by mouth every 7 (seven) days.   Yes Barton Fanny, MD    SURG, SOC and FAM HX reviewed.  ROS: As per HPI; otherwise, noncontributory.  O: Filed Vitals:   10/28/14 0924  BP: 162/94  Pulse: 76  Temp: 98.1 F (36.7 C)  Resp: 16   GEN: In NAD; WN,WD. HENT: [place]/AT; EOMI w/ clear conj/sclerae.  COR: RRR. LUNGS: Unlabored resp. SKIN: Forearms- erythema w/ small blisters. No other lesions on trunk or proximal extremities. NEURO: A&O x 3; CNs intact. Nonfocal. PSYCH: PHQ-9 score= 23.  A/P: Blistering rash- Suspect Shingles; RX: valacyclovir and silvadene topical.  Essential hypertension- Stable on current medications; elevated reading today due to discomfort of blistering rash.  Vitamin D deficiency- Very low level = 12; continue Vitamin D indefinitely.  Anxiety and depression- Continue current medications and counseling w/ Mr. Jerline Pain.  Meds ordered this encounter  Medications  . valACYclovir (VALTREX) 1000 MG tablet    Sig: Take 1 tablet every 12 hours for 7 days.    Dispense:  28 tablet  Refill:  0  . Vitamin D, Ergocalciferol, (DRISDOL) 50000 UNITS CAPS capsule    Sig: Take 1 capsule (50,000 Units total) by mouth every 7 (seven) days.    Dispense:  4 capsule    Refill:  7  . diclofenac (VOLTAREN) 75 MG EC tablet    Sig: Take 1 tablet twice a day with meal or snack.    Dispense:  60 tablet    Refill:  5  . silver sulfADIAZINE (SILVADENE) 1 % cream    Sig: Apply 1 application topically daily.    Dispense:  50 g     Refill:  0

## 2014-11-21 ENCOUNTER — Telehealth: Payer: Self-pay | Admitting: Family Medicine

## 2014-11-21 DIAGNOSIS — F419 Anxiety disorder, unspecified: Secondary | ICD-10-CM

## 2014-11-21 NOTE — Telephone Encounter (Signed)
Rx faxed. Pt has an appt.

## 2014-11-21 NOTE — Telephone Encounter (Signed)
Needs OV to establish care with new provider for further refills.

## 2015-01-24 ENCOUNTER — Other Ambulatory Visit: Payer: Self-pay | Admitting: Family Medicine

## 2015-01-24 ENCOUNTER — Ambulatory Visit (INDEPENDENT_AMBULATORY_CARE_PROVIDER_SITE_OTHER): Payer: 59 | Admitting: Family Medicine

## 2015-01-24 ENCOUNTER — Encounter: Payer: Self-pay | Admitting: Family Medicine

## 2015-01-24 VITALS — BP 179/91 | HR 60 | Temp 98.8°F | Resp 16 | Wt 209.0 lb

## 2015-01-24 DIAGNOSIS — E038 Other specified hypothyroidism: Secondary | ICD-10-CM

## 2015-01-24 DIAGNOSIS — M170 Bilateral primary osteoarthritis of knee: Secondary | ICD-10-CM

## 2015-01-24 DIAGNOSIS — E034 Atrophy of thyroid (acquired): Secondary | ICD-10-CM | POA: Diagnosis not present

## 2015-01-24 DIAGNOSIS — I10 Essential (primary) hypertension: Secondary | ICD-10-CM | POA: Diagnosis not present

## 2015-01-24 DIAGNOSIS — E1169 Type 2 diabetes mellitus with other specified complication: Secondary | ICD-10-CM | POA: Diagnosis not present

## 2015-01-24 DIAGNOSIS — F329 Major depressive disorder, single episode, unspecified: Secondary | ICD-10-CM

## 2015-01-24 DIAGNOSIS — F418 Other specified anxiety disorders: Secondary | ICD-10-CM | POA: Diagnosis not present

## 2015-01-24 DIAGNOSIS — E785 Hyperlipidemia, unspecified: Secondary | ICD-10-CM | POA: Diagnosis not present

## 2015-01-24 DIAGNOSIS — F32A Depression, unspecified: Secondary | ICD-10-CM

## 2015-01-24 DIAGNOSIS — F419 Anxiety disorder, unspecified: Secondary | ICD-10-CM

## 2015-01-24 DIAGNOSIS — E669 Obesity, unspecified: Secondary | ICD-10-CM

## 2015-01-24 LAB — POCT GLYCOSYLATED HEMOGLOBIN (HGB A1C): HEMOGLOBIN A1C: 5.9

## 2015-01-24 LAB — TSH: TSH: 10.278 u[IU]/mL — ABNORMAL HIGH (ref 0.350–4.500)

## 2015-01-24 MED ORDER — ALPRAZOLAM 1 MG PO TABS: 1.0000 mg | ORAL_TABLET | Freq: Two times a day (BID) | ORAL | Status: DC | PRN

## 2015-01-24 MED ORDER — HYDROCHLOROTHIAZIDE 12.5 MG PO TABS: 12.5000 mg | ORAL_TABLET | Freq: Every day | ORAL | Status: DC

## 2015-01-24 MED ORDER — LEVOTHYROXINE SODIUM 25 MCG PO TABS: 25.0000 ug | ORAL_TABLET | Freq: Every day | ORAL | Status: DC

## 2015-01-24 MED ORDER — METFORMIN HCL 500 MG PO TABS: 500.0000 mg | ORAL_TABLET | Freq: Every day | ORAL | Status: DC

## 2015-01-24 MED ORDER — DICLOFENAC SODIUM 75 MG PO TBEC: DELAYED_RELEASE_TABLET | ORAL | Status: DC

## 2015-01-24 MED ORDER — LEVOTHYROXINE SODIUM 150 MCG PO TABS: 150.0000 ug | ORAL_TABLET | Freq: Every day | ORAL | Status: DC

## 2015-01-24 NOTE — Patient Instructions (Signed)
Try over the counter melatonin for sleep, take for at least 2 weeks.

## 2015-01-24 NOTE — Progress Notes (Signed)
Subjective:    Patient ID: Virginia Romero, female    DOB: [birth year], 59 y.o.   MRN: [id number]  HPI Patient presents today for follow up of depression/anxiety, HTN, hypothyroidism.  Depression/anxiety-  She has recently gone on full disability. She is missing her job and her income. She continues to see a therapist monthly. Her son spent three weeks with her and she really enjoyed his company and has been down since he left. She has two elderly dogs and a cat. She is very worried about her dogs passing away. Her husband died in 14-Sep-2013 and she is still grieving. She has been somewhat isolated since she stopped working at [employer]. She feels that she behaved badly and had a break down in front of everyone. Because of that, she has stopped going to church because she sees a coworker there. She is working on volunteering at a [employer].   HTN- she has been out of her meds for several days  Hypothyroidism- last TSH 5/16 was 8.8. She reports compliance with synthroid until she ran out 2-3 days ago. She takes in the morning on an empty stomach.   Past Medical History  Diagnosis Date  . History of ovarian cyst   . Endometrial polyp   . Hypertension   . Cancer     THYROID  . Diabetes mellitus   . Depression   . Anxiety   . PTSD (post-traumatic stress disorder)   . Thyroid disease   . Arthritis     KNEES AND LOWER BACK    Past Surgical History  Procedure Laterality Date  . Thyroidectomy  09-15-98  . Tubal ligation    . Diagnostic laparoscopy  07/17/2006    WITH LYSIS OF PERITUBULAR AND PERIOVARIAN ADHESIONS, LYSIS OF OMENTAL ADHESIONS, LSO, RIGHT  OVARIAN CYSTECTOMY , HYSTEROSCOPY  WITH EXCISION OF ENDOMETRIAL POLYP.Marland Kitchen.  . Cesarean section  Sep 14, 1977 AND 09/14/1984    X2   Family History  Problem Relation Age of Onset  . Hypertension Mother   . Heart disease Mother   . Diabetes Brother   . Hypertension Maternal Grandmother   . Heart disease Maternal Grandmother   . Diabetes  Maternal Grandfather   . Diabetes Paternal Grandfather    Social History  Substance Use Topics  . Smoking status: Current Some Day Smoker    Types: Cigarettes  . Smokeless tobacco: Never Used  . Alcohol Use: No  Medications, allergies, past medical history, surgical history, family history, social history and problem list reviewed and updated.  Review of Systems No chest pain, no SOB, no edema, no palpitations, + fatigue, + constipation. Continued knee pain.     Objective:   Physical Exam    BP 179/91 mmHg  Pulse 60  Temp(Src) 98.8 F (37.1 C) (Oral)  Resp 16  Wt 209 lb (94.802 kg) Wt Readings from Last 3 Encounters:  01/24/15 209 lb (94.802 kg)  10/28/14 207 lb 6.4 oz (94.076 kg)  10/04/14 206 lb 6.4 oz (93.622 kg)       Assessment & Plan:  1. Essential hypertension - hydrochlorothiazide (HYDRODIURIL) 12.5 MG tablet; Take 1 tablet (12.5 mg total) by mouth daily.  Dispense: 90 tablet; Refill: 3  2. Hypothyroidism due to acquired atrophy of thyroid - levothyroxine (SYNTHROID, LEVOTHROID) 150 MCG tablet; Take 1 tablet (150 mcg total) by mouth daily.  Dispense: 30 tablet; Refill: 5 - TSH  3. DM type 2 with diabetic dyslipidemia - metFORMIN (GLUCOPHAGE) 500 MG tablet; Take  1 tablet (500 mg total) by mouth daily with breakfast.  Dispense: 60 tablet; Refill: 11 - POCT glycosylated hemoglobin (Hb A1C)  4. Primary osteoarthritis of both knees - diclofenac (VOLTAREN) 75 MG EC tablet; Take 1 tablet twice a day with meal or snack.  Dispense: 60 tablet; Refill: 5  5. Anxiety and depression - Had lengthy discussion with patient about need for increased socialization since she is feeling isolated. She is interested in returning to church and I encouraged her to follow through with volunteer opportunity. We also discussed ways she can incorporate some light exercise/stretching into her day.  - Encouraged her to get on a regular schedule with sleep, meals, outings/activities.   6.  Obesity - POCT glycosylated hemoglobin (Hb A1C)  7. Anxiety - ALPRAZolam (XANAX) 1 MG tablet; Take 1 tablet (1 mg total) by mouth 2 (two) times daily as needed for anxiety.  Dispense: 60 tablet; Refill: 0  - follow up in 3 months  Clarene Reamer, FNP-BC  [facility]  01/26/2015 9:56 PM

## 2015-02-14 ENCOUNTER — Other Ambulatory Visit: Payer: Self-pay | Admitting: Family Medicine

## 2015-02-15 NOTE — Telephone Encounter (Signed)
I just gave her a 30 day supply 22 days ago. Please find out if she is taking differently than prescribed.

## 2015-02-17 NOTE — Telephone Encounter (Signed)
Spoke with pt, she lost her dog after 17 years and admits to taking more than prescribed. She states her head is everywhere and she just wondered if she could get an early refill. She states if not it is ok and she didn't mean to overuse.

## 2015-02-23 ENCOUNTER — Ambulatory Visit (INDEPENDENT_AMBULATORY_CARE_PROVIDER_SITE_OTHER): Payer: 59

## 2015-02-23 ENCOUNTER — Ambulatory Visit (INDEPENDENT_AMBULATORY_CARE_PROVIDER_SITE_OTHER): Payer: 59 | Admitting: Emergency Medicine

## 2015-02-23 VITALS — BP 150/90 | HR 72 | Temp 98.4°F | Resp 16 | Ht 62.0 in | Wt 200.0 lb

## 2015-02-23 DIAGNOSIS — M25512 Pain in left shoulder: Secondary | ICD-10-CM

## 2015-02-23 DIAGNOSIS — S42292A Other displaced fracture of upper end of left humerus, initial encounter for closed fracture: Secondary | ICD-10-CM

## 2015-02-23 DIAGNOSIS — M25562 Pain in left knee: Secondary | ICD-10-CM

## 2015-02-23 DIAGNOSIS — S42212A Unspecified displaced fracture of surgical neck of left humerus, initial encounter for closed fracture: Secondary | ICD-10-CM | POA: Diagnosis not present

## 2015-02-23 DIAGNOSIS — S80212A Abrasion, left knee, initial encounter: Secondary | ICD-10-CM | POA: Diagnosis not present

## 2015-02-23 DIAGNOSIS — S8992XA Unspecified injury of left lower leg, initial encounter: Secondary | ICD-10-CM

## 2015-02-23 MED ORDER — KETOROLAC TROMETHAMINE 60 MG/2ML IM SOLN
60.0000 mg | Freq: Once | INTRAMUSCULAR | Status: AC
  Administered 2015-02-23: 60 mg via INTRAMUSCULAR

## 2015-02-23 MED ORDER — HYDROCODONE-ACETAMINOPHEN 5-325 MG PO TABS: 1.0000 | ORAL_TABLET | Freq: Four times a day (QID) | ORAL | Status: DC | PRN

## 2015-02-23 MED ORDER — IBUPROFEN 200 MG PO TABS
600.0000 mg | ORAL_TABLET | Freq: Once | ORAL | Status: AC
  Administered 2015-02-23: 600 mg via ORAL

## 2015-02-23 NOTE — Progress Notes (Signed)
[facility] [address] [place] [address] [phone]  Date:  02/23/2015   Name:  Virginia Romero   DOB:  [birth year]   MRN:  [id number]  PCP:  Ellsworth Lennox, MD    History of Present Illness:  Virginia Romero is a 59 y.o. female patient with PMH of arthritis, anxiety, and depressoin who presents to [facility] for chief complaint of left shoulder and left knee pain that started about 2 hours ago.  She was walking down the stairs of her home, when she tripped and fell on her left side.  Immediate pain insued.  She was alone at her home, and she dragged herself to her car and came here.  She is unable to move her left shoulder.  She is able to bear weight of the left knee, however considerable swelling started.  She is in a lot of pain at this time at the left shoulder.  She knows of no numbness or tingling.  Just the pain. She is currently on disability.     Patient Active Problem List   Diagnosis Date Noted  . Vitamin D deficiency 10/28/2014  . Pain in joint, lower leg 02/16/2014  . DM type 2 with diabetic dyslipidemia 09/08/2013  . Type II or unspecified type diabetes mellitus without mention of complication, not stated as uncontrolled 09/08/2013  . Diabetic eye exam 08/25/2013  . DJD (degenerative joint disease) of knee 11/19/2012  . Obesity 11/12/2012  . Anxiety and depression 06/25/2012  . Hypertension 06/25/2012  . Hypothyroid 06/25/2012    Past Medical History  Diagnosis Date  . History of ovarian cyst   . Endometrial polyp   . Hypertension   . Cancer     THYROID  . Diabetes mellitus   . Depression   . Anxiety   . PTSD (post-traumatic stress disorder)   . Thyroid disease   . Arthritis     KNEES AND LOWER BACK     Past Surgical History  Procedure Laterality Date  . Thyroidectomy  2000  . Tubal ligation    . Diagnostic laparoscopy  07/17/2006    WITH LYSIS OF PERITUBULAR AND PERIOVARIAN ADHESIONS, LYSIS OF OMENTAL ADHESIONS, LSO, RIGHT   OVARIAN CYSTECTOMY , HYSTEROSCOPY  WITH EXCISION OF ENDOMETRIAL POLYP.Marland Kitchen.  . Cesarean section  '79 AND '86    X2    Social History  Substance Use Topics  . Smoking status: Current Some Day Smoker    Types: Cigarettes  . Smokeless tobacco: Never Used  . Alcohol Use: No    Family History  Problem Relation Age of Onset  . Hypertension Mother   . Heart disease Mother   . Diabetes Brother   . Hypertension Maternal Grandmother   . Heart disease Maternal Grandmother   . Diabetes Maternal Grandfather   . Diabetes Paternal Grandfather     No Known Allergies  Medication list has been reviewed and updated.  Current Outpatient Prescriptions on File Prior to Visit  Medication Sig Dispense Refill  . ALPRAZolam (XANAX) 1 MG tablet take 1 tablet by mouth twice a day if needed 60 tablet 0  . citalopram (CELEXA) 20 MG tablet Take 2 tablets every night for depression. 60 tablet 11  . diclofenac (VOLTAREN) 75 MG EC tablet Take 1 tablet twice a day with meal or snack. 60 tablet 5  . hydrochlorothiazide (HYDRODIURIL) 12.5 MG tablet Take 1 tablet (12.5 mg total) by mouth daily. 90 tablet 3  . levothyroxine (LEVOTHROID) 25 MCG  tablet Take 1 tablet (25 mcg total) by mouth daily before breakfast. Take in addition to 150 mcg levothyroxine for a total of 175 mcg. 90 tablet 0  . levothyroxine (SYNTHROID, LEVOTHROID) 150 MCG tablet Take 1 tablet (150 mcg total) by mouth daily. 30 tablet 5  . lisinopril (PRINIVIL,ZESTRIL) 2.5 MG tablet Take 1 tablet (2.5 mg total) by mouth daily. 30 tablet 5  . metFORMIN (GLUCOPHAGE) 500 MG tablet Take 1 tablet (500 mg total) by mouth daily with breakfast. 60 tablet 11  . metoprolol (LOPRESSOR) 100 MG tablet TAKE ONE TABLET BY MOUTH TWICE DAILY 60 tablet 11  . pravastatin (PRAVACHOL) 40 MG tablet Take 1 tablet (40 mg total) by mouth daily. 90 tablet 1  . Vitamin D, Ergocalciferol, (DRISDOL) 50000 UNITS CAPS capsule Take 1 capsule (50,000 Units total) by mouth every 7 (seven)  days. 4 capsule 7  . [DISCONTINUED] Metoprolol Succinate (TOPROL XL PO) Take by mouth.     No current facility-administered medications on file prior to visit.    ROS ROS otherwise unremarkable unless listed above.   Physical Examination: Pulse 72  Temp(Src) 98.4 F (36.9 C) (Oral)  Resp 16  Ht 5\' 2"  (1.575 m)  Wt 200 lb (90.719 kg)  BMI 36.57 kg/m2  SpO2 98% Ideal Body Weight: Weight in (lb) to have BMI = 25: 136.4  Physical Exam  Constitutional: She is oriented to person, place, and time. She appears well-developed and well-nourished. No distress.  HENT:  Head: Normocephalic and atraumatic.  Right Ear: External ear normal.  Left Ear: External ear normal.  Eyes: Conjunctivae and EOM are normal. Pupils are equal, round, and reactive to light.  Cardiovascular: Normal rate.   Pulmonary/Chest: Effort normal. No respiratory distress.  Musculoskeletal:  Exam limited due to pain.  No spinous tenderness at this time.  ROM of cervical.  Clavicular tenderness as approaching distal.  Tender at the [facility] toward the deltoid.  No active movement from the left shoulder.  Pain with active elbow flexion.  More pronounced pain with abduction.  Passive movement of shoulder flexion to the 60 degrees.  Normal radial pulse.    Abrasion of the anterior knee. Swelling along anterior knee surrounding the patella.  No patella tendon tenderness.  Negative mcMurray meniscus.  No medial or lateral laxity.  Knee with good passive motion.   Neurological: She is alert and oriented to person, place, and time.  Skin: She is not diaphoretic.  Psychiatric: She has a normal mood and affect. Her behavior is normal.    Shoulder [facility] reading (PRIMARY) by  Dr. Ouida Sills: Degenerative arthritis.  Fracture of the humeral head, and possible neck.    Knee [facility] reading (PRIMARY) by  Dr. Ouida Sills: Degenerative arthritis seen, but no acute findings.    Assessment and Plan: 59 year old female with PMH listed above is here  today with shoulder and knee tenderness at the left side following a fall today. -Given 600mg  of ibuprofen, followed by 60 mg of toradol injection for pain here at clinic. -Fx seen, contacted ortho and coordinated care for consult tomorrow at 1pm at [place] in arm sling for comfort.   -Advised RICE regarding knee. -Given Norco for home.  Humeral head fracture, left, closed, initial encounter - Plan: HYDROcodone-acetaminophen (NORCO) 5-325 MG per tablet, AMB referral to orthopedics, ketorolac (TORADOL) injection 60 mg  Left shoulder pain - Plan: DG Shoulder Left, ibuprofen (ADVIL,MOTRIN) tablet 600 mg, HYDROcodone-acetaminophen (NORCO) 5-325 MG per tablet, ketorolac (TORADOL) injection 60 mg  Left knee pain - Plan: DG Knee Complete 4 Views Left, ibuprofen (ADVIL,MOTRIN) tablet 600 mg  Fx humeral neck, left, closed, initial encounter - Plan: HYDROcodone-acetaminophen (NORCO) 5-325 MG per tablet, AMB referral to orthopedics  Left knee injury, initial encounter  Ivar Drape, PA-C [facility] 02/23/2015 4:21 PM

## 2015-02-23 NOTE — Patient Instructions (Addendum)
You're appointment is scheduled at 1pm at [address], [place], [place] [address].   Please ice the knee 3-4 times per day for 15 minutes.  Stretch the knee daily.   Try to elevate the knee. Knee Exercises EXERCISES RANGE OF MOTION (ROM) AND STRETCHING EXERCISES These exercises may help you when beginning to rehabilitate your injury. Your symptoms may resolve with or without further involvement from your physician, physical therapist, or athletic trainer. While completing these exercises, remember:   Restoring tissue flexibility helps normal motion to return to the joints. This allows healthier, less painful movement and activity.  An effective stretch should be held for at least 30 seconds.  A stretch should never be painful. You should only feel a gentle lengthening or release in the stretched tissue. STRETCH - Knee Extension, Prone  Lie on your stomach on a firm surface, such as a bed or countertop. Place your right / left knee and leg just beyond the edge of the surface. You may wish to place a towel under the far end of your right / left thigh for comfort.  Relax your leg muscles and allow gravity to straighten your knee. Your clinician may advise you to add an ankle weight if more resistance is helpful for you.  You should feel a stretch in the back of your right / left knee. Hold this position for __________ seconds. Repeat __________ times. Complete this stretch __________ times per day. * Your physician, physical therapist, or athletic trainer may ask you to add ankle weight to enhance your stretch.  RANGE OF MOTION - Knee Flexion, Active  Lie on your back with both knees straight. (If this causes back discomfort, bend your opposite knee, placing your foot flat on the floor.)  Slowly slide your heel back toward your buttocks until you feel a gentle stretch in the front of your knee or thigh.  Hold for __________ seconds. Slowly slide your heel back  to the starting position. Repeat __________ times. Complete this exercise __________ times per day.  STRETCH - Quadriceps, Prone   Lie on your stomach on a firm surface, such as a bed or padded floor.  Bend your right / left knee and grasp your ankle. If you are unable to reach your ankle or pant leg, use a belt around your foot to lengthen your reach.  Gently pull your heel toward your buttocks. Your knee should not slide out to the side. You should feel a stretch in the front of your thigh and/or knee.  Hold this position for __________ seconds. Repeat __________ times. Complete this stretch __________ times per day.  STRETCH - Hamstrings, Supine   Lie on your back. Loop a belt or towel over the ball of your right / left foot.  Straighten your right / left knee and slowly pull on the belt to raise your leg. Do not allow the right / left knee to bend. Keep your opposite leg flat on the floor.  Raise the leg until you feel a gentle stretch behind your right / left knee or thigh. Hold this position for __________ seconds. Repeat __________ times. Complete this stretch __________ times per day.  STRENGTHENING EXERCISES These exercises may help you when beginning to rehabilitate your injury. They may resolve your symptoms with or without further involvement from your physician, physical therapist, or athletic trainer. While completing these exercises, remember:   Muscles can gain both the endurance and the strength needed for everyday activities through controlled  exercises.  Complete these exercises as instructed by your physician, physical therapist, or athletic trainer. Progress the resistance and repetitions only as guided.  You may experience muscle soreness or fatigue, but the pain or discomfort you are trying to eliminate should never worsen during these exercises. If this pain does worsen, stop and make certain you are following the directions exactly. If the pain is still present  after adjustments, discontinue the exercise until you can discuss the trouble with your clinician. STRENGTH - Quadriceps, Isometrics  Lie on your back with your right / left leg extended and your opposite knee bent.  Gradually tense the muscles in the front of your right / left thigh. You should see either your knee cap slide up toward your hip or increased dimpling just above the knee. This motion will push the back of the knee down toward the floor/mat/bed on which you are lying.  Hold the muscle as tight as you can without increasing your pain for __________ seconds.  Relax the muscles slowly and completely in between each repetition. Repeat __________ times. Complete this exercise __________ times per day.  STRENGTH - Quadriceps, Short Arcs   Lie on your back. Place a __________ inch towel roll under your knee so that the knee slightly bends.  Raise only your lower leg by tightening the muscles in the front of your thigh. Do not allow your thigh to rise.  Hold this position for __________ seconds. Repeat __________ times. Complete this exercise __________ times per day.  OPTIONAL ANKLE WEIGHTS: Begin with ____________________, but DO NOT exceed ____________________. Increase in 1 pound/0.5 kilogram increments.  STRENGTH - Quadriceps, Straight Leg Raises  Quality counts! Watch for signs that the quadriceps muscle is working to insure you are strengthening the correct muscles and not "cheating" by substituting with healthier muscles.  Lay on your back with your right / left leg extended and your opposite knee bent.  Tense the muscles in the front of your right / left thigh. You should see either your knee cap slide up or increased dimpling just above the knee. Your thigh may even quiver.  Tighten these muscles even more and raise your leg 4 to 6 inches off the floor. Hold for __________ seconds.  Keeping these muscles tense, lower your leg.  Relax the muscles slowly and completely in  between each repetition. Repeat __________ times. Complete this exercise __________ times per day.  STRENGTH - Hamstring, Curls  Lay on your stomach with your legs extended. (If you lay on a bed, your feet may hang over the edge.)  Tighten the muscles in the back of your thigh to bend your right / left knee up to 90 degrees. Keep your hips flat on the bed/floor.  Hold this position for __________ seconds.  Slowly lower your leg back to the starting position. Repeat __________ times. Complete this exercise __________ times per day.  OPTIONAL ANKLE WEIGHTS: Begin with ____________________, but DO NOT exceed ____________________. Increase in 1 pound/0.5 kilogram increments.  STRENGTH - Quadriceps, Squats  Stand in a door frame so that your feet and knees are in line with the frame.  Use your hands for balance, not support, on the frame.  Slowly lower your weight, bending at the hips and knees. Keep your lower legs upright so that they are parallel with the door frame. Squat only within the range that does not increase your knee pain. Never let your hips drop below your knees.  Slowly return upright, pushing with your  legs, not pulling with your hands. Repeat __________ times. Complete this exercise __________ times per day.  STRENGTH - Quadriceps, Wall Slides  Follow guidelines for form closely. Increased knee pain often results from poorly placed feet or knees.  Lean against a smooth wall or door and walk your feet out 18-24 inches. Place your feet hip-width apart.  Slowly slide down the wall or door until your knees bend __________ degrees.* Keep your knees over your heels, not your toes, and in line with your hips, not falling to either side.  Hold for __________ seconds. Stand up to rest for __________ seconds in between each repetition. Repeat __________ times. Complete this exercise __________ times per day. * Your physician, physical therapist, or athletic trainer will alter this  angle based on your symptoms and progress. Document Released: 04/03/2005 Document Revised: 10/04/2013 Document Reviewed: 09/01/2008 [facility] Patient Information 2015 [place], [place]. This information is not intended to replace advice given to you by your health care provider. Make sure you discuss any questions you have with your health care provider.

## 2015-02-25 NOTE — Progress Notes (Signed)
  Medical screening examination/treatment/procedure(s) were performed by non-physician practitioner and as supervising physician I was immediately available for consultation/collaboration.     

## 2015-02-27 ENCOUNTER — Telehealth: Payer: Self-pay

## 2015-02-27 NOTE — Telephone Encounter (Signed)
Patient is calling because she was referred to [facility] and was told she has appointment at 1. Patient has been calling their office and hasn't been getting an answer. She wants to know if we're willing to call for her because she is in a lot of pain. Patient phone: [phone]

## 2015-02-27 NOTE — Telephone Encounter (Signed)
Spoke with pt, she is unable to get someone on the phone at [facility]. I will call call and check status. Pt states she is in a lot of pain.   I called [employer] and they state they are just waiting on the CT scan to get authorized through her insurance. The will contact her once this is done. Called pt to let her know. Pt understood.

## 2015-02-28 ENCOUNTER — Encounter (HOSPITAL_COMMUNITY): Payer: Self-pay | Admitting: *Deleted

## 2015-02-28 NOTE — Progress Notes (Signed)
Virginia Romero reports that she stopped taking Lisnopril and Pravastin because it made her cough.

## 2015-03-01 ENCOUNTER — Inpatient Hospital Stay (HOSPITAL_COMMUNITY): Payer: 59

## 2015-03-01 ENCOUNTER — Encounter (HOSPITAL_COMMUNITY)
Admission: AD | Disposition: A | Discharge status: Home / Self Care | Payer: Self-pay | Source: Ambulatory Visit | Attending: Orthopedic Surgery

## 2015-03-01 ENCOUNTER — Ambulatory Visit (HOSPITAL_COMMUNITY): Payer: 59 | Admitting: Anesthesiology

## 2015-03-01 ENCOUNTER — Encounter (HOSPITAL_COMMUNITY): Payer: Self-pay | Admitting: *Deleted

## 2015-03-01 ENCOUNTER — Ambulatory Visit (HOSPITAL_COMMUNITY): Payer: 59

## 2015-03-01 ENCOUNTER — Inpatient Hospital Stay (HOSPITAL_COMMUNITY)
Admission: AD | Admit: 2015-03-01 | Discharge: 2015-03-03 | DRG: 494 | Disposition: A | Discharge status: Home / Self Care | Payer: 59 | Source: Ambulatory Visit | Attending: Orthopedic Surgery | Admitting: Orthopedic Surgery

## 2015-03-01 DIAGNOSIS — I10 Essential (primary) hypertension: Secondary | ICD-10-CM | POA: Diagnosis present

## 2015-03-01 DIAGNOSIS — S4290XA Fracture of unspecified shoulder girdle, part unspecified, initial encounter for closed fracture: Secondary | ICD-10-CM | POA: Diagnosis present

## 2015-03-01 DIAGNOSIS — S42252A Displaced fracture of greater tuberosity of left humerus, initial encounter for closed fracture: Principal | ICD-10-CM | POA: Diagnosis present

## 2015-03-01 DIAGNOSIS — S4292XA Fracture of left shoulder girdle, part unspecified, initial encounter for closed fracture: Secondary | ICD-10-CM

## 2015-03-01 DIAGNOSIS — F329 Major depressive disorder, single episode, unspecified: Secondary | ICD-10-CM | POA: Diagnosis present

## 2015-03-01 DIAGNOSIS — E785 Hyperlipidemia, unspecified: Secondary | ICD-10-CM | POA: Diagnosis present

## 2015-03-01 DIAGNOSIS — F419 Anxiety disorder, unspecified: Secondary | ICD-10-CM | POA: Diagnosis present

## 2015-03-01 DIAGNOSIS — Z791 Long term (current) use of non-steroidal anti-inflammatories (NSAID): Secondary | ICD-10-CM

## 2015-03-01 DIAGNOSIS — E119 Type 2 diabetes mellitus without complications: Secondary | ICD-10-CM | POA: Diagnosis present

## 2015-03-01 DIAGNOSIS — F4310 Post-traumatic stress disorder, unspecified: Secondary | ICD-10-CM | POA: Diagnosis present

## 2015-03-01 DIAGNOSIS — M25512 Pain in left shoulder: Secondary | ICD-10-CM | POA: Diagnosis present

## 2015-03-01 DIAGNOSIS — W19XXXA Unspecified fall, initial encounter: Secondary | ICD-10-CM | POA: Diagnosis present

## 2015-03-01 DIAGNOSIS — F17210 Nicotine dependence, cigarettes, uncomplicated: Secondary | ICD-10-CM | POA: Diagnosis present

## 2015-03-01 DIAGNOSIS — Z79899 Other long term (current) drug therapy: Secondary | ICD-10-CM

## 2015-03-01 DIAGNOSIS — Z419 Encounter for procedure for purposes other than remedying health state, unspecified: Secondary | ICD-10-CM

## 2015-03-01 HISTORY — DX: Adverse effect of unspecified anesthetic, initial encounter: T41.45XA

## 2015-03-01 HISTORY — DX: Other complications of anesthesia, initial encounter: T88.59XA

## 2015-03-01 HISTORY — DX: Gastro-esophageal reflux disease without esophagitis: K21.9

## 2015-03-01 HISTORY — PX: ORIF HUMERUS FRACTURE: SHX2126

## 2015-03-01 HISTORY — DX: Hyperlipidemia, unspecified: E78.5

## 2015-03-01 HISTORY — DX: Reserved for inherently not codable concepts without codable children: IMO0001

## 2015-03-01 LAB — POCT I-STAT, CHEM 8
BUN: 14 mg/dL (ref 6–20)
CALCIUM ION: 1.19 mmol/L (ref 1.12–1.23)
Chloride: 101 mmol/L (ref 101–111)
Creatinine, Ser: 0.9 mg/dL (ref 0.44–1.00)
GLUCOSE: 81 mg/dL (ref 65–99)
HCT: 41 % (ref 36.0–46.0)
Hemoglobin: 13.9 g/dL (ref 12.0–15.0)
Potassium: 3.7 mmol/L (ref 3.5–5.1)
SODIUM: 141 mmol/L (ref 135–145)
TCO2: 26 mmol/L (ref 0–100)

## 2015-03-01 LAB — GLUCOSE, CAPILLARY: GLUCOSE-CAPILLARY: 142 mg/dL — AB (ref 65–99)

## 2015-03-01 SURGERY — OPEN REDUCTION INTERNAL FIXATION (ORIF) PROXIMAL HUMERUS FRACTURE
Anesthesia: Regional | Site: Arm Upper | Laterality: Left

## 2015-03-01 MED ORDER — SODIUM CHLORIDE 0.9 % IV SOLN
INTRAVENOUS | Status: DC
  Administered 2015-03-01: 21:00:00 via INTRAVENOUS

## 2015-03-01 MED ORDER — HYDROCHLOROTHIAZIDE 25 MG PO TABS
12.5000 mg | ORAL_TABLET | Freq: Every day | ORAL | Status: DC
  Administered 2015-03-01 – 2015-03-03 (×3): 12.5 mg via ORAL
  Filled 2015-03-01 (×3): qty 1

## 2015-03-01 MED ORDER — METOCLOPRAMIDE HCL 5 MG/ML IJ SOLN: 5.0000 mg | Freq: Three times a day (TID) | INTRAMUSCULAR | Status: DC | PRN

## 2015-03-01 MED ORDER — PHENYLEPHRINE HCL 10 MG/ML IJ SOLN
10.0000 mg | INTRAVENOUS | Status: DC | PRN
  Administered 2015-03-01: 25 ug/min via INTRAVENOUS

## 2015-03-01 MED ORDER — ACETAMINOPHEN 325 MG PO TABS: 650.0000 mg | ORAL_TABLET | Freq: Four times a day (QID) | ORAL | Status: DC | PRN

## 2015-03-01 MED ORDER — BUPIVACAINE-EPINEPHRINE (PF) 0.25% -1:200000 IJ SOLN
INTRAMUSCULAR | Status: DC | PRN
  Administered 2015-03-01: 10 mL

## 2015-03-01 MED ORDER — PHENYLEPHRINE HCL 10 MG/ML IJ SOLN
INTRAMUSCULAR | Status: AC
  Filled 2015-03-01: qty 2

## 2015-03-01 MED ORDER — NEOSTIGMINE METHYLSULFATE 10 MG/10ML IV SOLN
INTRAVENOUS | Status: AC
  Filled 2015-03-01: qty 1

## 2015-03-01 MED ORDER — FENTANYL CITRATE (PF) 100 MCG/2ML IJ SOLN
50.0000 ug | INTRAMUSCULAR | Status: DC | PRN
  Administered 2015-03-01: 100 ug via INTRAVENOUS
  Administered 2015-03-01: 50 ug via INTRAVENOUS

## 2015-03-01 MED ORDER — 0.9 % SODIUM CHLORIDE (POUR BTL) OPTIME
TOPICAL | Status: DC | PRN
  Administered 2015-03-01: 1000 mL

## 2015-03-01 MED ORDER — VITAMIN D (ERGOCALCIFEROL) 1.25 MG (50000 UNIT) PO CAPS: 50000.0000 [IU] | ORAL_CAPSULE | ORAL | Status: DC

## 2015-03-01 MED ORDER — METOCLOPRAMIDE HCL 5 MG PO TABS: 5.0000 mg | ORAL_TABLET | Freq: Three times a day (TID) | ORAL | Status: DC | PRN

## 2015-03-01 MED ORDER — FENTANYL CITRATE (PF) 100 MCG/2ML IJ SOLN
INTRAMUSCULAR | Status: AC
  Administered 2015-03-01: 50 ug via INTRAVENOUS
  Filled 2015-03-01: qty 2

## 2015-03-01 MED ORDER — PHENOL 1.4 % MT LIQD: 1.0000 | OROMUCOSAL | Status: DC | PRN

## 2015-03-01 MED ORDER — METFORMIN HCL 500 MG PO TABS
500.0000 mg | ORAL_TABLET | Freq: Every day | ORAL | Status: DC
Start: 2015-03-02 — End: 2015-03-03
  Administered 2015-03-02 – 2015-03-03 (×2): 500 mg via ORAL
  Filled 2015-03-01 (×2): qty 1

## 2015-03-01 MED ORDER — POLYETHYLENE GLYCOL 3350 17 G PO PACK
17.0000 g | PACK | Freq: Every day | ORAL | Status: DC | PRN
  Filled 2015-03-01: qty 1

## 2015-03-01 MED ORDER — CEFAZOLIN SODIUM-DEXTROSE 2-3 GM-% IV SOLR
2.0000 g | INTRAVENOUS | Status: AC
  Administered 2015-03-01: 2 g via INTRAVENOUS
  Filled 2015-03-01 (×2): qty 50

## 2015-03-01 MED ORDER — FENTANYL CITRATE (PF) 250 MCG/5ML IJ SOLN
INTRAMUSCULAR | Status: AC
  Filled 2015-03-01: qty 5

## 2015-03-01 MED ORDER — ALPRAZOLAM 0.5 MG PO TABS
0.5000 mg | ORAL_TABLET | Freq: Three times a day (TID) | ORAL | Status: DC | PRN
  Administered 2015-03-02 – 2015-03-03 (×3): 0.5 mg via ORAL
  Filled 2015-03-01 (×3): qty 1

## 2015-03-01 MED ORDER — LACTATED RINGERS IV SOLN
INTRAVENOUS | Status: DC
  Administered 2015-03-01 (×3): via INTRAVENOUS

## 2015-03-01 MED ORDER — ACETAMINOPHEN 650 MG RE SUPP: 650.0000 mg | Freq: Four times a day (QID) | RECTAL | Status: DC | PRN

## 2015-03-01 MED ORDER — MIDAZOLAM HCL 2 MG/2ML IJ SOLN
INTRAMUSCULAR | Status: AC
  Filled 2015-03-01: qty 4

## 2015-03-01 MED ORDER — MENTHOL 3 MG MT LOZG: 1.0000 | LOZENGE | OROMUCOSAL | Status: DC | PRN

## 2015-03-01 MED ORDER — LEVOTHYROXINE SODIUM 25 MCG PO TABS
25.0000 ug | ORAL_TABLET | Freq: Every day | ORAL | Status: DC
  Filled 2015-03-01: qty 1

## 2015-03-01 MED ORDER — CHLORHEXIDINE GLUCONATE 4 % EX LIQD: 60.0000 mL | Freq: Once | CUTANEOUS | Status: DC

## 2015-03-01 MED ORDER — INSULIN ASPART 100 UNIT/ML ~~LOC~~ SOLN: 6.0000 [IU] | Freq: Three times a day (TID) | SUBCUTANEOUS | Status: DC

## 2015-03-01 MED ORDER — DEXTROSE 5 % IV SOLN
500.0000 mg | Freq: Four times a day (QID) | INTRAVENOUS | Status: DC | PRN
  Filled 2015-03-01: qty 5

## 2015-03-01 MED ORDER — OXYCODONE HCL 5 MG/5ML PO SOLN: 5.0000 mg | Freq: Once | ORAL | Status: DC | PRN

## 2015-03-01 MED ORDER — ROCURONIUM BROMIDE 100 MG/10ML IV SOLN
INTRAVENOUS | Status: DC | PRN
  Administered 2015-03-01: 50 mg via INTRAVENOUS

## 2015-03-01 MED ORDER — FENTANYL CITRATE (PF) 100 MCG/2ML IJ SOLN: 25.0000 ug | INTRAMUSCULAR | Status: DC | PRN

## 2015-03-01 MED ORDER — LISINOPRIL 2.5 MG PO TABS
2.5000 mg | ORAL_TABLET | Freq: Every day | ORAL | Status: DC
  Administered 2015-03-01 – 2015-03-02 (×2): 2.5 mg via ORAL
  Filled 2015-03-01 (×3): qty 1

## 2015-03-01 MED ORDER — GLYCOPYRROLATE 0.2 MG/ML IJ SOLN
INTRAMUSCULAR | Status: AC
  Filled 2015-03-01: qty 2

## 2015-03-01 MED ORDER — CITALOPRAM HYDROBROMIDE 20 MG PO TABS
20.0000 mg | ORAL_TABLET | Freq: Every day | ORAL | Status: DC
  Administered 2015-03-01 – 2015-03-03 (×3): 20 mg via ORAL
  Filled 2015-03-01 (×3): qty 1

## 2015-03-01 MED ORDER — HYDROCODONE-ACETAMINOPHEN 5-325 MG PO TABS: 1.0000 | ORAL_TABLET | Freq: Four times a day (QID) | ORAL | Status: DC | PRN

## 2015-03-01 MED ORDER — ONDANSETRON HCL 4 MG/2ML IJ SOLN
INTRAMUSCULAR | Status: AC
  Filled 2015-03-01: qty 2

## 2015-03-01 MED ORDER — MIDAZOLAM HCL 2 MG/2ML IJ SOLN
INTRAMUSCULAR | Status: AC
  Administered 2015-03-01: 2 mg via INTRAVENOUS
  Filled 2015-03-01: qty 2

## 2015-03-01 MED ORDER — ONDANSETRON HCL 4 MG/2ML IJ SOLN: 4.0000 mg | Freq: Four times a day (QID) | INTRAMUSCULAR | Status: DC | PRN

## 2015-03-01 MED ORDER — DOCUSATE SODIUM 100 MG PO CAPS
100.0000 mg | ORAL_CAPSULE | Freq: Two times a day (BID) | ORAL | Status: DC
  Administered 2015-03-01 – 2015-03-03 (×4): 100 mg via ORAL
  Filled 2015-03-01 (×4): qty 1

## 2015-03-01 MED ORDER — INSULIN ASPART 100 UNIT/ML ~~LOC~~ SOLN: 0.0000 [IU] | Freq: Every day | SUBCUTANEOUS | Status: DC

## 2015-03-01 MED ORDER — PROPOFOL 10 MG/ML IV BOLUS
INTRAVENOUS | Status: AC
  Filled 2015-03-01: qty 20

## 2015-03-01 MED ORDER — BISACODYL 10 MG RE SUPP: 10.0000 mg | Freq: Every day | RECTAL | Status: DC | PRN

## 2015-03-01 MED ORDER — DEXAMETHASONE SODIUM PHOSPHATE 4 MG/ML IJ SOLN
INTRAMUSCULAR | Status: AC
  Filled 2015-03-01: qty 2

## 2015-03-01 MED ORDER — LIDOCAINE HCL (CARDIAC) 20 MG/ML IV SOLN
INTRAVENOUS | Status: DC | PRN
  Administered 2015-03-01: 70 mg via INTRAVENOUS

## 2015-03-01 MED ORDER — NEOSTIGMINE METHYLSULFATE 10 MG/10ML IV SOLN
INTRAVENOUS | Status: DC | PRN
  Administered 2015-03-01: 3 mg via INTRAVENOUS

## 2015-03-01 MED ORDER — BUPIVACAINE-EPINEPHRINE (PF) 0.5% -1:200000 IJ SOLN
INTRAMUSCULAR | Status: DC | PRN
  Administered 2015-03-01: 30 mL via PERINEURAL

## 2015-03-01 MED ORDER — METHOCARBAMOL 500 MG PO TABS
500.0000 mg | ORAL_TABLET | Freq: Four times a day (QID) | ORAL | Status: DC | PRN
  Administered 2015-03-02 – 2015-03-03 (×5): 500 mg via ORAL
  Filled 2015-03-01 (×5): qty 1

## 2015-03-01 MED ORDER — INSULIN ASPART 100 UNIT/ML ~~LOC~~ SOLN: 0.0000 [IU] | Freq: Three times a day (TID) | SUBCUTANEOUS | Status: DC

## 2015-03-01 MED ORDER — METOPROLOL TARTRATE 100 MG PO TABS
100.0000 mg | ORAL_TABLET | Freq: Two times a day (BID) | ORAL | Status: DC
  Administered 2015-03-01 – 2015-03-03 (×4): 100 mg via ORAL
  Filled 2015-03-01 (×4): qty 1

## 2015-03-01 MED ORDER — ONDANSETRON HCL 4 MG/2ML IJ SOLN
INTRAMUSCULAR | Status: DC | PRN
  Administered 2015-03-01 (×2): 4 mg via INTRAVENOUS

## 2015-03-01 MED ORDER — GLYCOPYRROLATE 0.2 MG/ML IJ SOLN
INTRAMUSCULAR | Status: DC | PRN
  Administered 2015-03-01: 0.4 mg via INTRAVENOUS

## 2015-03-01 MED ORDER — DEXAMETHASONE SODIUM PHOSPHATE 4 MG/ML IJ SOLN
INTRAMUSCULAR | Status: DC | PRN
  Administered 2015-03-01: 8 mg via INTRAVENOUS

## 2015-03-01 MED ORDER — ONDANSETRON HCL 4 MG PO TABS: 4.0000 mg | ORAL_TABLET | Freq: Four times a day (QID) | ORAL | Status: DC | PRN

## 2015-03-01 MED ORDER — METHOCARBAMOL 500 MG PO TABS: 500.0000 mg | ORAL_TABLET | Freq: Three times a day (TID) | ORAL | Status: DC | PRN

## 2015-03-01 MED ORDER — MIDAZOLAM HCL 2 MG/2ML IJ SOLN
1.0000 mg | INTRAMUSCULAR | Status: DC | PRN
  Administered 2015-03-01: 2 mg via INTRAVENOUS

## 2015-03-01 MED ORDER — BUPIVACAINE-EPINEPHRINE (PF) 0.25% -1:200000 IJ SOLN
INTRAMUSCULAR | Status: AC
  Filled 2015-03-01: qty 30

## 2015-03-01 MED ORDER — ACETAMINOPHEN 160 MG/5ML PO SOLN
325.0000 mg | ORAL | Status: DC | PRN
  Filled 2015-03-01: qty 20.3

## 2015-03-01 MED ORDER — HYDROCODONE-ACETAMINOPHEN 5-325 MG PO TABS
1.0000 | ORAL_TABLET | Freq: Four times a day (QID) | ORAL | Status: DC | PRN
  Administered 2015-03-02 – 2015-03-03 (×6): 2 via ORAL
  Filled 2015-03-01 (×7): qty 2

## 2015-03-01 MED ORDER — OXYCODONE HCL 5 MG PO TABS: 5.0000 mg | ORAL_TABLET | Freq: Once | ORAL | Status: DC | PRN

## 2015-03-01 MED ORDER — ROCURONIUM BROMIDE 50 MG/5ML IV SOLN
INTRAVENOUS | Status: AC
  Filled 2015-03-01: qty 1

## 2015-03-01 MED ORDER — HYDROMORPHONE HCL 1 MG/ML IJ SOLN
0.5000 mg | INTRAMUSCULAR | Status: DC | PRN
  Administered 2015-03-02 (×2): 1 mg via INTRAVENOUS
  Filled 2015-03-01 (×2): qty 1

## 2015-03-01 MED ORDER — LEVOTHYROXINE SODIUM 25 MCG PO TABS
175.0000 ug | ORAL_TABLET | Freq: Every day | ORAL | Status: DC
  Administered 2015-03-02 – 2015-03-03 (×2): 175 ug via ORAL
  Filled 2015-03-01 (×4): qty 1

## 2015-03-01 MED ORDER — PROPOFOL 10 MG/ML IV BOLUS
INTRAVENOUS | Status: DC | PRN
  Administered 2015-03-01: 130 mg via INTRAVENOUS

## 2015-03-01 MED ORDER — LIDOCAINE HCL (CARDIAC) 20 MG/ML IV SOLN
INTRAVENOUS | Status: AC
  Filled 2015-03-01: qty 5

## 2015-03-01 MED ORDER — CEFAZOLIN SODIUM 1-5 GM-% IV SOLN
1.0000 g | Freq: Four times a day (QID) | INTRAVENOUS | Status: AC
  Administered 2015-03-01 – 2015-03-02 (×3): 1 g via INTRAVENOUS
  Filled 2015-03-01 (×3): qty 50

## 2015-03-01 MED ORDER — MIDAZOLAM HCL 5 MG/5ML IJ SOLN
INTRAMUSCULAR | Status: DC | PRN
  Administered 2015-03-01: 1 mg via INTRAVENOUS

## 2015-03-01 MED ORDER — ACETAMINOPHEN 325 MG PO TABS: 325.0000 mg | ORAL_TABLET | ORAL | Status: DC | PRN

## 2015-03-01 SURGICAL SUPPLY — 64 items
BIT DRILL 4 LONG FAST STEP (BIT) ×2 IMPLANT
BIT DRILL 4 SHORT FAST STEP (BIT) ×2 IMPLANT
BIT DRILL SNP 4.0MM (BIT) ×1 IMPLANT
COVER SURGICAL LIGHT HANDLE (MISCELLANEOUS) ×2 IMPLANT
DRAPE C-ARM 42X72 X-RAY (DRAPES) ×2 IMPLANT
DRAPE IMP U-DRAPE 54X76 (DRAPES) ×2 IMPLANT
DRAPE INCISE IOBAN 66X45 STRL (DRAPES) ×4 IMPLANT
DRAPE ORTHO SPLIT 77X108 STRL (DRAPES) ×2
DRAPE SURG ORHT 6 SPLT 77X108 (DRAPES) ×2 IMPLANT
DRAPE U-SHAPE 47X51 STRL (DRAPES) ×2 IMPLANT
DRILL BIT SNP 4.0MM (BIT) ×1
DRSG ADAPTIC 3X8 NADH LF (GAUZE/BANDAGES/DRESSINGS) ×2 IMPLANT
DRSG EMULSION OIL 3X3 NADH (GAUZE/BANDAGES/DRESSINGS) IMPLANT
DRSG PAD ABDOMINAL 8X10 ST (GAUZE/BANDAGES/DRESSINGS) ×2 IMPLANT
DURAPREP 26ML APPLICATOR (WOUND CARE) ×2 IMPLANT
ELECT BLADE 4.0 EZ CLEAN MEGAD (MISCELLANEOUS) ×2
ELECT NEEDLE TIP 2.8 STRL (NEEDLE) ×2 IMPLANT
ELECT REM PT RETURN 9FT ADLT (ELECTROSURGICAL) ×2
ELECTRODE BLDE 4.0 EZ CLN MEGD (MISCELLANEOUS) ×1 IMPLANT
ELECTRODE REM PT RTRN 9FT ADLT (ELECTROSURGICAL) ×1 IMPLANT
GAUZE SPONGE 4X4 12PLY STRL (GAUZE/BANDAGES/DRESSINGS) ×2 IMPLANT
GLOVE BIOGEL PI ORTHO PRO 7.5 (GLOVE) ×1
GLOVE BIOGEL PI ORTHO PRO SZ8 (GLOVE) ×1
GLOVE ORTHO TXT STRL SZ7.5 (GLOVE) ×2 IMPLANT
GLOVE PI ORTHO PRO STRL 7.5 (GLOVE) ×1 IMPLANT
GLOVE PI ORTHO PRO STRL SZ8 (GLOVE) ×1 IMPLANT
GLOVE SURG ORTHO 8.5 STRL (GLOVE) ×2 IMPLANT
GOWN STRL REUS W/ TWL XL LVL3 (GOWN DISPOSABLE) ×2 IMPLANT
GOWN STRL REUS W/TWL XL LVL3 (GOWN DISPOSABLE) ×2
K-WIRE 2X5 SS THRDED S3 (WIRE) ×2
KIT BASIN OR (CUSTOM PROCEDURE TRAY) ×2 IMPLANT
KIT ROOM TURNOVER OR (KITS) ×2 IMPLANT
KWIRE 2X5 SS THRDED S3 (WIRE) ×1 IMPLANT
MANIFOLD NEPTUNE II (INSTRUMENTS) ×2 IMPLANT
NEEDLE HYPO 25GX1X1/2 BEV (NEEDLE) ×2 IMPLANT
NS IRRIG 1000ML POUR BTL (IV SOLUTION) ×2 IMPLANT
PACK SHOULDER (CUSTOM PROCEDURE TRAY) ×2 IMPLANT
PACK UNIVERSAL I (CUSTOM PROCEDURE TRAY) IMPLANT
PAD ABD 8X10 STRL (GAUZE/BANDAGES/DRESSINGS) ×2 IMPLANT
PAD ARMBOARD 7.5X6 YLW CONV (MISCELLANEOUS) ×4 IMPLANT
PEG STND 4.0X25.0MM (Orthopedic Implant) ×2 IMPLANT
PEG STND 4.0X32.5MM (Orthopedic Implant) ×4 IMPLANT
PEG STND 4.0X35MM (Orthopedic Implant) ×4 IMPLANT
PEG STND 4.0X37.5MM (Orthopedic Implant) ×2 IMPLANT
PEGSTD 4.0X32.5MM (Orthopedic Implant) ×2 IMPLANT
PEGSTD 4.0X35MM (Orthopedic Implant) ×2 IMPLANT
PEGSTD 4.0X37.5MM (Orthopedic Implant) ×1 IMPLANT
PLATE SZ3 SHOULDER NAIL SNP (Plate) ×2 IMPLANT
SCREW SNP UNICORTICAL (Screw) ×4 IMPLANT
SLING ARM FOAM STRAP XLG (SOFTGOODS) ×2 IMPLANT
SPONGE LAP 18X18 X RAY DECT (DISPOSABLE) ×2 IMPLANT
SPONGE LAP 4X18 X RAY DECT (DISPOSABLE) IMPLANT
STRIP CLOSURE SKIN 1/2X4 (GAUZE/BANDAGES/DRESSINGS) ×2 IMPLANT
SUCTION FRAZIER TIP 10 FR DISP (SUCTIONS) ×2 IMPLANT
SUT FIBERWIRE #2 38 T-5 BLUE (SUTURE) ×8
SUT MNCRL AB 4-0 PS2 18 (SUTURE) ×2 IMPLANT
SUT VIC AB 0 CT1 27 (SUTURE) ×1
SUT VIC AB 0 CT1 27XBRD ANBCTR (SUTURE) ×1 IMPLANT
SUT VIC AB 2-0 CT1 27 (SUTURE) ×2
SUT VIC AB 2-0 CT1 TAPERPNT 27 (SUTURE) ×2 IMPLANT
SUTURE FIBERWR #2 38 T-5 BLUE (SUTURE) ×4 IMPLANT
SYR CONTROL 10ML LL (SYRINGE) ×2 IMPLANT
TOWEL OR 17X24 6PK STRL BLUE (TOWEL DISPOSABLE) ×2 IMPLANT
TOWEL OR 17X26 10 PK STRL BLUE (TOWEL DISPOSABLE) ×2 IMPLANT

## 2015-03-01 NOTE — Transfer of Care (Signed)
Immediate Anesthesia Transfer of Care Note  Patient: Virginia Romero  Procedure(s) Performed: Procedure(s): OPEN REDUCTION INTERNAL FIXATION (ORIF) PROXIMAL HUMERUS FRACTURE (Left)  Patient Location: PACU  Anesthesia Type:GA combined with regional for post-op pain  Level of Consciousness: awake, oriented and sedated  Airway & Oxygen Therapy: Patient connected to nasal cannula oxygen  Post-op Assessment: Report given to RN and Post -op Vital signs reviewed and stable  Post vital signs: Reviewed and stable  Last Vitals:  Filed Vitals:   03/01/15 1630  BP:   Pulse: 85  Temp:   Resp: 19    Complications: No apparent anesthesia complications

## 2015-03-01 NOTE — Discharge Instructions (Signed)
Ice to the shoulder at all times.  Use the sling while up and assistance to prevent falls.  May remove the sling with supervision for exercises.  Gentle lap slides, pendulums, rotation as tolerated, and active wrist and elbow ROM  Keep the incision covered and clean and dry for one week, then ok to get wet in the shower.  Follow up with Dr Veverly Fells in two weeks in the clinic  [phone]

## 2015-03-01 NOTE — H&P (Signed)
Virginia Romero is an 59 y.o. female.    Chief Complaint: left shoulder pain  HPI: Pt is a 59 y.o. female complaining of left shoulder pain s/p recent fall onto left side. Pain had continually increased since the beginning. X-rays in the clinic show left proximal humerus fracture with displacement. Pt has tried various conservative treatments which have failed to alleviate their symptoms, including injections and therapy. Various options are discussed with the patient. Risks, benefits and expectations were discussed with the patient. Patient understand the risks, benefits and expectations and wishes to proceed with surgery.   PCP:  Ellsworth Lennox, MD  D/C Plans: Home  PMH: Past Medical History  Diagnosis Date  . History of ovarian cyst   . Endometrial polyp   . Hypertension   . Cancer     THYROID  . Depression   . Anxiety   . PTSD (post-traumatic stress disorder)   . Thyroid disease   . Arthritis     KNEES AND LOWER BACK   . 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 Diabetes mellitus     Type II  . Shortness of breath dyspnea     with anxiety  . Hyperlipemia     PSH: Past Surgical History  Procedure Laterality Date  . Thyroidectomy  2000  . Tubal ligation    . Diagnostic laparoscopy  07/17/2006    WITH LYSIS OF PERITUBULAR AND PERIOVARIAN ADHESIONS, LYSIS OF OMENTAL ADHESIONS, LSO, RIGHT  OVARIAN CYSTECTOMY , HYSTEROSCOPY  WITH EXCISION OF ENDOMETRIAL POLYP.Marland Kitchen.  . Cesarean section  '79 AND '86    X2  . Colonoscopy      with polypectomy    Social History:  reports that she has been smoking Cigarettes.  She has been smoking about 0.50 packs per day. She has never used smokeless tobacco. She reports that she does not drink alcohol or use illicit drugs.  Allergies:  No Known Allergies  Medications: No current facility-administered medications for this encounter.   Current Outpatient Prescriptions  Medication Sig Dispense Refill  .  ALPRAZolam (XANAX) 1 MG tablet take 1 tablet by mouth twice a day if needed 60 tablet 0  . citalopram (CELEXA) 20 MG tablet Take 2 tablets every night for depression. 60 tablet 11  . diclofenac (VOLTAREN) 75 MG EC tablet Take 1 tablet twice a day with meal or snack. 60 tablet 5  . hydrochlorothiazide (HYDRODIURIL) 12.5 MG tablet Take 1 tablet (12.5 mg total) by mouth daily. 90 tablet 3  . HYDROcodone-acetaminophen (NORCO) 5-325 MG per tablet Take 1 tablet by mouth every 6 (six) hours as needed. 30 tablet 0  . levothyroxine (LEVOTHROID) 25 MCG tablet Take 1 tablet (25 mcg total) by mouth daily before breakfast. Take in addition to 150 mcg levothyroxine for a total of 175 mcg. 90 tablet 0  . levothyroxine (SYNTHROID, LEVOTHROID) 150 MCG tablet Take 1 tablet (150 mcg total) by mouth daily. 30 tablet 5  . lisinopril (PRINIVIL,ZESTRIL) 2.5 MG tablet Take 1 tablet (2.5 mg total) by mouth daily. 30 tablet 5  . metFORMIN (GLUCOPHAGE) 500 MG tablet Take 1 tablet (500 mg total) by mouth daily with breakfast. 60 tablet 11  . metoprolol (LOPRESSOR) 100 MG tablet TAKE ONE TABLET BY MOUTH TWICE DAILY 60 tablet 11  . pravastatin (PRAVACHOL) 40 MG tablet Take 1 tablet (40 mg total) by mouth daily. 90 tablet 1  . Vitamin D, Ergocalciferol, (DRISDOL) 50000 UNITS CAPS capsule Take 1  capsule (50,000 Units total) by mouth every 7 (seven) days. 4 capsule 7  . [DISCONTINUED] Metoprolol Succinate (TOPROL XL PO) Take by mouth.      No results found for this or any previous visit (from the past 48 hour(s)). No results found.  ROS: Pain with rom of the left upper extremity  Physical Exam:  Alert and oriented 59 y.o. female in no acute distress Cranial nerves 2-12 intact Cervical spine: full rom with no tenderness, nv intact distally Chest: active breath sounds bilaterally, no wheeze rhonchi or rales Heart: regular rate and rhythm, no murmur Abd: non tender non distended with active bowel sounds Hip is stable with  rom  Left shoulder with very limited rom nv intact distally Moderate edema and ecchymosis to left arm/shoulder No rashes or signs of open injury  Assessment/Plan Assessment: left proximal humerus fracture with displacement  Plan: Patient will undergo a left proximal humerus ORIF by Dr. Veverly Fells at [facility]. Risks benefits and expectations were discussed with the patient. Patient understand risks, benefits and expectations and wishes to proceed.

## 2015-03-01 NOTE — Brief Op Note (Signed)
03/01/2015  6:49 PM  PATIENT:  Virginia Romero  59 y.o. female  PRE-OPERATIVE DIAGNOSIS:  LEFT DISPLACED AND COMMINUTED PROXIMAL HUMERUS FRACTURE  POST-OPERATIVE DIAGNOSIS:  LEFT DISPLACED AND COMMINUTED PROXIMAL HUMERUS FRACTURE  PROCEDURE:  Procedure(s): OPEN REDUCTION INTERNAL FIXATION (ORIF) PROXIMAL HUMERUS FRACTURE (Left) BIOMET SNP NAIL PLATE  SURGEON:  Surgeon(s) and Role:    * Netta Cedars, MD - Primary  PHYSICIAN ASSISTANT:   ASSISTANTS: Ventura Bruns, PA-C   ANESTHESIA:   regional and general  EBL:  Total I/O In: 1000 [I.V.:1000] Out: 175 [Blood:175]  BLOOD ADMINISTERED:none  DRAINS: none   LOCAL MEDICATIONS USED:  MARCAINE     SPECIMEN:  No Specimen  DISPOSITION OF SPECIMEN:  N/A  COUNTS:  YES  TOURNIQUET:  * No tourniquets in log *  DICTATION: .Other Dictation: Dictation Number [id number]  PLAN OF CARE: Admit to inpatient   PATIENT DISPOSITION:  PACU - hemodynamically stable.   Delay start of Pharmacological VTE agent (>24hrs) due to surgical blood loss or risk of bleeding: yes

## 2015-03-01 NOTE — Anesthesia Postprocedure Evaluation (Signed)
  Anesthesia Post-op Note  Patient: Virginia Romero  Procedure(s) Performed: Procedure(s): OPEN REDUCTION INTERNAL FIXATION (ORIF) PROXIMAL HUMERUS FRACTURE (Left)  Patient Location: PACU  Anesthesia Type:GA combined with regional for post-op pain  Level of Consciousness: awake, alert , oriented and patient cooperative  Airway and Oxygen Therapy: Patient Spontanous Breathing and Patient connected to nasal cannula oxygen  Post-op Pain: none  Post-op Assessment: Post-op Vital signs reviewed, Patient's Cardiovascular Status Stable, Respiratory Function Stable, Patent Airway, No signs of Nausea or vomiting and Pain level controlled              Post-op Vital Signs: Reviewed and stable  Last Vitals:  Filed Vitals:   03/01/15 1945  BP:   Pulse: 87  Temp:   Resp: 21    Complications: No apparent anesthesia complications

## 2015-03-01 NOTE — Interval H&P Note (Signed)
History and Physical Interval Note:  03/01/2015 4:11 PM  Virginia Romero  has presented today for surgery, with the diagnosis of LEFT PROXIMAL HUMERUS FRACTURE  The various methods of treatment have been discussed with the patient and family. After consideration of risks, benefits and other options for treatment, the patient has consented to  Procedure(s): OPEN REDUCTION INTERNAL FIXATION (ORIF) PROXIMAL HUMERUS FRACTURE (Left) as a surgical intervention .  The patient's history has been reviewed, patient examined, no change in status, stable for surgery.  I have reviewed the patient's chart and labs.  Questions were answered to the patient's satisfaction.     NORRIS,STEVEN R

## 2015-03-01 NOTE — Anesthesia Preprocedure Evaluation (Signed)
Anesthesia Evaluation  Patient identified by MRN, date of birth, ID band Patient awake    Reviewed: Allergy & Precautions, NPO status , Patient's Chart, lab work & pertinent test results  History of Anesthesia Complications Negative for: history of anesthetic complications  Airway Mallampati: III  TM Distance: >3 FB Neck ROM: Full    Dental  (+) Poor Dentition,    Pulmonary shortness of breath, Current Smoker,    breath sounds clear to auscultation       Cardiovascular hypertension,  Rhythm:Regular     Neuro/Psych PSYCHIATRIC DISORDERS Anxiety Depression negative neurological ROS     GI/Hepatic negative GI ROS, Neg liver ROS,   Endo/Other  diabetes, Type 2Hypothyroidism Morbid obesity  Renal/GU negative Renal ROS     Musculoskeletal  (+) Arthritis ,   Abdominal   Peds  Hematology negative hematology ROS (+)   Anesthesia Other Findings   Reproductive/Obstetrics                             Anesthesia Physical Anesthesia Plan  ASA: III  Anesthesia Plan: General and Regional   Post-op Pain Management: GA combined w/ Regional for post-op pain   Induction: Intravenous  Airway Management Planned: Oral ETT  Additional Equipment: None  Intra-op Plan:   Post-operative Plan: Extubation in OR  Informed Consent: I have reviewed the patients History and Physical, chart, labs and discussed the procedure including the risks, benefits and alternatives for the proposed anesthesia with the patient or authorized representative who has indicated his/her understanding and acceptance.   Dental advisory given  Plan Discussed with: CRNA and Surgeon  Anesthesia Plan Comments:         Anesthesia Quick Evaluation

## 2015-03-01 NOTE — Anesthesia Procedure Notes (Signed)
Anesthesia Regional Block:  Interscalene brachial plexus block  Pre-Anesthetic Checklist: ,, timeout performed, Correct Patient, Correct Site, Correct Laterality, Correct Procedure, Correct Position, site marked, Risks and benefits discussed,  Surgical consent,  Pre-op evaluation,  At surgeon's request and post-op pain management  Laterality: Upper and Left  Prep: chloraprep       Needles:  Injection technique: Single-shot  Needle Type: Echogenic Stimulator Needle          Additional Needles:  Procedures: ultrasound guided (picture in chart) and nerve stimulator Interscalene brachial plexus block  Nerve Stimulator or Paresthesia:  Response: deltoid, 0.5 mA,   Additional Responses:   Narrative:  Injection made incrementally with aspirations every 5 mL.  Performed by: Personally  Anesthesiologist: MOSER, CHRISTOPHER  Additional Notes: H+P and labs reviewed, risks and benefits discussed with patient, procedure tolerated well without complications      

## 2015-03-01 NOTE — Progress Notes (Signed)
CSW received call from pt's Short Stay RN re: pt expressing suicidal ideation.  CSW unable to assess pt because she had just been given meds in preparation for surgery.  Per sittter, pt was "woozy" and her niece was in the room.  CSW will pass to psych CSW for f/u.

## 2015-03-02 ENCOUNTER — Encounter (HOSPITAL_COMMUNITY): Payer: Self-pay | Admitting: Orthopedic Surgery

## 2015-03-02 LAB — BASIC METABOLIC PANEL
Anion gap: 10 (ref 5–15)
BUN: 12 mg/dL (ref 6–20)
CALCIUM: 8.4 mg/dL — AB (ref 8.9–10.3)
CO2: 26 mmol/L (ref 22–32)
Chloride: 103 mmol/L (ref 101–111)
Creatinine, Ser: 0.94 mg/dL (ref 0.44–1.00)
GFR calc Af Amer: >60 mL/min (ref >60)
GLUCOSE: 158 mg/dL — AB (ref 65–99)
Potassium: 4.5 mmol/L (ref 3.5–5.1)
Sodium: 139 mmol/L (ref 135–145)

## 2015-03-02 LAB — GLUCOSE, CAPILLARY
GLUCOSE-CAPILLARY: 145 mg/dL — AB (ref 65–99)
Glucose-Capillary: 135 mg/dL — ABNORMAL HIGH (ref 65–99)
Glucose-Capillary: 136 mg/dL — ABNORMAL HIGH (ref 65–99)
Glucose-Capillary: 153 mg/dL — ABNORMAL HIGH (ref 65–99)

## 2015-03-02 LAB — HEMOGLOBIN AND HEMATOCRIT, BLOOD
HEMATOCRIT: 31.1 % — AB (ref 36.0–46.0)
HEMOGLOBIN: 9.8 g/dL — AB (ref 12.0–15.0)

## 2015-03-02 NOTE — Op Note (Signed)
NAMEMarland Kitchen  Virginia Romero, Virginia Romero NO.:  [id number]  MEDICAL RECORD NO.:  [id number]  LOCATION:  5N19C                        FACILITY:  [facility]  PHYSICIAN:  Doran Heater. Veverly Fells, M.D. DATE OF BIRTH:  07/11/1955  DATE OF PROCEDURE:  03/01/2015 DATE OF DISCHARGE:                              OPERATIVE REPORT   PREOPERATIVE DIAGNOSIS:  Displaced and comminuted proximal humerus fracture, left.  POSTOPERATIVE DIAGNOSIS:  Displaced and comminuted proximal humerus fracture, left.  PROCEDURE PERFORMED:  Open reduction and internal fixation of left displaced comminuted proximal humerus fracture.  ATTENDING SURGEON:  Doran Heater. Veverly Fells, MD.  ASSISTANT:  Abbott Pao. Dixon, PA-C, who scrubbed the entire procedure and necessary for satisfactory completion of surgery.  ANESTHESIA:  General anesthesia was used plus interscalene block.  ESTIMATED BLOOD LOSS:  Less than 100 mL.  FLUID REPLACEMENT:  1500 mL crystalloid.  COUNT:  Instrument count was correct.  COMPLICATIONS:  There were no complications.  ANTIBIOTICS:  Perioperative antibiotics were given.  INDICATIONS:  The patient is a 59 year old female, who suffered a ground- level fall injuring her left shoulder.  The patient is unable to elevate her arm, complained of severe pain.  The patient has a displaced and comminuted proximal humerus fracture with significant greater tuberosity displacement.  Given the malalignment of her fracture and the displacement of greater tuberosity, we recommended surgical management to restore proximal humeral anatomy.  The  patient agreed and informed consent obtained.  DESCRIPTION OF PROCEDURE:  After an adequate level of anesthesia was achieved, the patient was positioned in modified beach-chair position. Left shoulder correctly identified, sterilely prepped and draped in usual manner.  Time-out was called.  We entered the shoulder using a standard deltopectoral approach starting at coracoid  process extending down to the anterior humerus.  Dissection down through subcutaneous tissues using Bovie.  We identified the cephalic vein, took it laterally to the deltoid, pectoralis taken medially.  Conjoined tendon was retracted medially.  Fracture site was identified.  Bone was removed from just posterior to the bicipital groove on the distal fragment.  We then used the awl to open up the intramedullary canal.  We slid the Biomet SNT nail plate down to the appropriate depth and then reduced the shoulder using a combination of traction and digital manipulation, internal to the shoulder.  We restored the inclination of the humeral head in the appropriate position of the humeral shaft under the humeral head.  We then placed a central guide pin, checked that on multi-view C- arm, AP and lateral.  We then went ahead and started placing our smooth pegs, these were locked pegs, into this Biomet fixed angled nail plate system.  We had 5 pegs with good purchase.  We then went ahead and placed our 2 distal unicortical screws engaging both the lateral cortex of the humerus and also the nail plate with good purchase that controlled rotational stability of the implant and the fracture construct.  We then went posteriorly and we were able to tease the greater tuberosity back around into position far posterior and retracted.  We then took #2 FiberWire suture in mattress fashion lateral to the greater tuberosity into the rotator cuff  muscle right at the myotendinous junction.  We were able to get good purchase and inserted across the plate and then humerus into the subscapularis and tied those mattress sutures.  We had excellent stability to the entire system rotating the shoulder and seeing no relative motion in the fracture site.  We irrigated thoroughly subdeltoid subpectoral plane.  Repaired deltopectoral interval with 0 Vicryl suture, followed by 2-0 Vicryl subcutaneous closure, and 4-0  Monocryl for skin.  Steri-Strips applied, followed by sterile dressing.  The patient tolerated the surgery well.     Doran Heater. Veverly Fells, M.D.     SRN/MEDQ  D:  03/01/2015  T:  03/02/2015  Job:  [id number]

## 2015-03-02 NOTE — Progress Notes (Signed)
Responded to spiritual care consult to provide support to patient who had fallen and injured her arm and was having suicidal ideation.  During visit patient was alert and talkative. Patient  indicated that she was not happy but did not want to talk at this time because she had a lot going on today.  There staff was presence taken vital and patient seemed a little aggitated.  Patient suggested that another time may be better for her.  However she welcomed visit. Will pass on to unit Chaplain for continue support as needed.   03/02/15 1100  Clinical Encounter Type  Visited With Patient  Visit Type Initial;Psychological support;Spiritual support  Referral From Physician;Other (Comment) (spiritual care consult)  Spiritual Encounters  Spiritual Needs Prayer;Emotional  Stress Factors  Patient Stress Factors None identified  [place], [place], [place]

## 2015-03-02 NOTE — Evaluation (Signed)
Occupational Therapy Evaluation Patient Details Name: RAYLEA ADCOX MRN: [id number] DOB: 09/18/1955 Today's Date: 03/02/2015    History of Present Illness OPEN REDUCTION INTERNAL FIXATION (ORIF) PROXIMAL HUMERUS FRACTURE   Clinical Impression   Pt reports she was independent with ADLs PTA. Pt lives alone and does not have much family support in the area. Pt planning to call son to see if he can stay with her at home upon d/c. Recommending 24/7 supervision at this time. Provided pt with shoulder precaution and shoulder pendulum exercise handout, will check back for further education and practice with exercises and ADLs. Pt would benefit from continued skilled OT services to address ADLs and shoulder exercises in order to safety d/c home.    Follow Up Recommendations  Supervision/Assistance - 24 hour    Equipment Recommendations  None recommended by OT    Recommendations for Other Services       Precautions / Restrictions Precautions Precautions: Shoulder;Fall;Other (comment) (Suicide Precautions) Type of Shoulder Precautions: Conservative protocol: pendulums, lap slides, no PROM/AROM of shoulder. AROM wrist, hand, elbow OK Shoulder Interventions: Shoulder sling/immobilizer;At all times;Off for dressing/bathing/exercises Precaution Booklet Issued: Yes (comment) Required Braces or Orthoses: Sling Restrictions Weight Bearing Restrictions: Yes LUE Weight Bearing: Non weight bearing      Mobility Bed Mobility               General bed mobility comments: Pt sitting EOB, returned to EOB  Transfers Overall transfer level: Needs assistance Equipment used: None Transfers: Sit to/from Stand Sit to Stand: Min guard              Balance Overall balance assessment: Needs assistance;History of Falls         Standing balance support: No upper extremity supported Standing balance-Leahy Scale: Fair                              ADL Overall ADL's : Needs  assistance/impaired                                       General ADL Comments: Pt with difficulty reaching feet to don socks. Pt reports being able to transfer to comfort height toilet. Pt reports that she was independent with ADLs following accident and prior to surgery. Will follow up with more ADL training. Provided pt with pendulum exercise handout and shoulder precuations handout     Vision     Perception     Praxis      Pertinent Vitals/Pain Pain Assessment: 0-10 Pain Score: 5  Pain Location: L shoulder Pain Descriptors / Indicators: Aching;Sore Pain Intervention(s): Limited activity within patient's tolerance;Monitored during session     Hand Dominance Right   Extremity/Trunk Assessment Upper Extremity Assessment Upper Extremity Assessment: LUE deficits/detail LUE Deficits / Details: hand, wrist ROM/MMT WFL LUE: Unable to fully assess due to immobilization   Lower Extremity Assessment Lower Extremity Assessment: Generalized weakness       Communication Communication Communication: No difficulties   Cognition Arousal/Alertness: Awake/alert Behavior During Therapy: Restless Overall Cognitive Status: Within Functional Limits for tasks assessed                     General Comments       Exercises Exercises: Shoulder (provided pt with handout, will check back to practice )     Shoulder Instructions  Home Living Family/patient expects to be discharged to:: Unsure Living Arrangements: Alone Available Help at Discharge: Family;Available 24 hours/day (son may be able to come stay for a few weeks) Type of Home: House Home Access: Stairs to enter [employer] of Steps: 8-10   Home Layout: One level     Bathroom Shower/Tub: Teacher, early years/pre: Standard     Home Equipment: Shower seat;Grab bars - tub/shower          Prior Functioning/Environment Level of Independence: Independent              OT Diagnosis: Generalized weakness;Acute pain   OT Problem List: Decreased strength;Decreased range of motion;Impaired balance (sitting and/or standing);Decreased safety awareness;Decreased knowledge of use of DME or AE;Decreased knowledge of precautions;Pain   OT Treatment/Interventions: Self-care/ADL training;Therapeutic exercise;Patient/family education    OT Goals(Current goals can be found in the care plan section) Acute Rehab OT Goals Patient Stated Goal: none stated at this time OT Goal Formulation: With patient Time For Goal Achievement: 03/16/15 Potential to Achieve Goals: Good ADL Goals Pt Will Perform Grooming: with modified independence;standing Pt Will Perform Upper Body Bathing: with supervision;sitting Pt Will Perform Lower Body Bathing: with supervision;sit to/from stand Pt Will Perform Upper Body Dressing: with supervision;sitting Pt Will Perform Lower Body Dressing: with supervision;sit to/from stand Pt Will Transfer to Toilet: with modified independence;ambulating Pt Will Perform Tub/Shower Transfer: Tub transfer;with supervision;ambulating;shower seat Pt/caregiver will Perform Home Exercise Program: Increased ROM;Left upper extremity;Independently;With written HEP provided  OT Frequency: Min 3X/week   Barriers to D/C: Decreased caregiver support          Co-evaluation              End of Session Equipment Utilized During Treatment: Other (comment) (sling)  Activity Tolerance: Patient tolerated treatment well Patient left: in bed;with nursing/sitter in room (sitting EOB)   Time: 3419-6222 OT Time Calculation (min): 20 min Charges:  OT General Charges $OT Visit: 1 Procedure OT Evaluation $Initial OT Evaluation Tier I: 1 Procedure G-Codes:     Binnie Kand M.S., OTR/L Pager: [phone]  03/02/2015, 11:16 AM

## 2015-03-02 NOTE — Progress Notes (Signed)
Utilization review completed.  

## 2015-03-02 NOTE — Progress Notes (Addendum)
Occupational Therapy Treatment Patient Details Name: Virginia Romero MRN: [id number] DOB: [birth year] Today's Date: 03/02/2015    History of present illness OPEN REDUCTION INTERNAL FIXATION (ORIF) PROXIMAL HUMERUS FRACTURE   OT comments  Pt progressing slowly toward OT goals. Educated pt on compensatory strategies with ADLs, sling management and wear schedule, positioning of LUE, NWB status and precautions. Pt verbalized understanding. Pt completed hand/wrist/elbow AROM exercises; unable to complete assisted pendulums without AROM of shoulder. Pt reports that she talked with her son and his is coming into town tomorrow night to stay with her for a few weeks. Continue to follow pt acutely.    Follow Up Recommendations  Supervision/Assistance - 24 hour    Equipment Recommendations  None recommended by OT    Recommendations for Other Services      Precautions / Restrictions Precautions Precautions: Shoulder;Fall;Other (comment) (suicide precautions) Type of Shoulder Precautions: Conservative protocol: pendulums, lap slides, no PROM/AROM of shoulder. AROM wrist, hand, elbow OK Shoulder Interventions: Shoulder sling/immobilizer;At all times;Off for dressing/bathing/exercises Precaution Booklet Issued: Yes (comment) Required Braces or Orthoses: Sling Restrictions Weight Bearing Restrictions: Yes LUE Weight Bearing: Non weight bearing       Mobility Bed Mobility Overal bed mobility: Needs Assistance Bed Mobility: Supine to Sit;Sit to Supine     Supine to sit: Min guard Sit to supine: Min guard   General bed mobility comments: Min guard for safety, increased time, verbal cues for technique  Transfers Overall transfer level: Needs assistance Equipment used: None Transfers: Sit to/from Stand Sit to Stand: Min guard              Balance Overall balance assessment: Needs assistance;History of Falls         Standing balance support: No upper extremity supported Standing  balance-Leahy Scale: Fair                     ADL Overall ADL's : Needs assistance/impaired                                       General ADL Comments: Educated pt on compensatory strategies for ADLs, sling management and wear schedule, precautions and NWB status      Vision                     Perception     Praxis      Cognition   Behavior During Therapy: WFL for tasks assessed/performed Overall Cognitive Status: Within Functional Limits for tasks assessed                       Extremity/Trunk Assessment  Upper Extremity Assessment Upper Extremity Assessment: LUE deficits/detail LUE Deficits / Details: hand, wrist ROM/MMT WFL LUE: Unable to fully assess due to immobilization   Lower Extremity Assessment Lower Extremity Assessment: Generalized weakness        Exercises Shoulder Exercises Elbow Flexion: AROM;Left;10 reps;Supine (HOB elevated ) Wrist Flexion: AROM;Left;10 reps;Supine (HOB elevated ) Digit Composite Flexion: AROM;Left;10 reps;Supine (HOB elevated) Donning/doffing shirt without moving shoulder: Moderate assistance Method for sponge bathing under operated UE: Moderate assistance Donning/doffing sling/immobilizer: Maximal assistance Correct positioning of sling/immobilizer: Maximal assistance ROM for elbow, wrist and digits of operated UE: Minimal assistance Sling wearing schedule (on at all times/off for ADL's): Minimal assistance Proper positioning of operated UE when showering: Maximal assistance Positioning of UE while sleeping: Maximal assistance  Shoulder Instructions Shoulder Instructions Donning/doffing shirt without moving shoulder: Moderate assistance Method for sponge bathing under operated UE: Moderate assistance Donning/doffing sling/immobilizer: Maximal assistance Correct positioning of sling/immobilizer: Maximal assistance ROM for elbow, wrist and digits of operated UE: Minimal assistance Sling  wearing schedule (on at all times/off for ADL's): Minimal assistance Proper positioning of operated UE when showering: Maximal assistance Positioning of UE while sleeping: Maximal assistance     General Comments      Pertinent Vitals/ Pain       Pain Assessment: 0-10 Pain Score: 7  Pain Location: L shoulder Pain Descriptors / Indicators: Sharp;Grimacing;Guarding Pain Intervention(s): Limited activity within patient's tolerance;Monitored during session;Repositioned;Patient requesting pain meds-RN notified;Ice applied  Home Living Family/patient expects to be discharged to:: Unsure Living Arrangements: Alone Available Help at Discharge: Family;Available 24 hours/day (son may be able to come stay for a few weeks) Type of Home: House Home Access: Stairs to enter [employer] of Steps: 8-10   Home Layout: One level     Bathroom Shower/Tub: Teacher, early years/pre: Standard     Home Equipment: Shower seat;Grab bars - tub/shower          Prior Functioning/Environment Level of Independence: Independent            Frequency Min 3X/week     Progress Toward Goals  OT Goals(current goals can now be found in the care plan section)  Progress towards OT goals: Progressing toward goals  Acute Rehab OT Goals Patient Stated Goal: none stated at this time OT Goal Formulation: With patient Time For Goal Achievement: 03/16/15 Potential to Achieve Goals: Good ADL Goals Pt Will Perform Grooming: with modified independence;standing Pt Will Perform Upper Body Bathing: with supervision;sitting Pt Will Perform Lower Body Bathing: with supervision;sit to/from stand Pt Will Perform Upper Body Dressing: with supervision;sitting Pt Will Perform Lower Body Dressing: with supervision;sit to/from stand Pt Will Transfer to Toilet: with modified independence;ambulating Pt Will Perform Tub/Shower Transfer: Tub transfer;with supervision;ambulating;shower seat Pt/caregiver  will Perform Home Exercise Program: Increased ROM;Left upper extremity;Independently;With written HEP provided  Plan Discharge plan remains appropriate    Co-evaluation                 End of Session Equipment Utilized During Treatment: Other (comment) (sling)   Activity Tolerance Patient limited by pain   Patient Left in bed;with call bell/phone within reach;with nursing/sitter in room   Nurse Communication Patient requests pain meds        Time: 4431-5400 OT Time Calculation (min): 23 min  Charges: OT General Charges $OT Visit: 1 Procedure OT Evaluation $Initial OT Evaluation Tier I: 1 Procedure OT Treatments $Self Care/Home Management : 8-22 mins $Therapeutic Exercise: 8-22 mins  Binnie Kand M.S., OTR/L Pager: [phone]  03/02/2015, 2:11 PM

## 2015-03-02 NOTE — Progress Notes (Signed)
Orthopedics Progress Note  Subjective: I feel much better today Objective:  Filed Vitals:   03/02/15 0636  BP: 149/79  Pulse: 71  Temp: 98.1 F (36.7 C)  Resp: 19    General: Awake and alert  Musculoskeletal: left shoulder dressing CDI, NVI eft UE,  Neurovascularly intact  Lab Results  Component Value Date   WBC 8.3 11/12/2012   HGB 9.8* 03/02/2015   HCT 31.1* 03/02/2015   MCV 81.8 11/12/2012   PLT 330 11/12/2012       Component Value Date/Time   NA 139 03/02/2015 0346   K 4.5 03/02/2015 0346   CL 103 03/02/2015 0346   CO2 26 03/02/2015 0346   GLUCOSE 158* 03/02/2015 0346   BUN 12 03/02/2015 0346   CREATININE 0.94 03/02/2015 0346   CREATININE 0.87 10/04/2014 1557   CALCIUM 8.4* 03/02/2015 0346   GFRNONAA >60 03/02/2015 0346   GFRNONAA 74 10/04/2014 1557   GFRAA >60 03/02/2015 0346   GFRAA 85 10/04/2014 1557    Lab Results  Component Value Date   INR 1.0 01/14/2007    Assessment/Plan: POD #1 s/p Procedure(s): OPEN REDUCTION INTERNAL FIXATION (ORIF) PROXIMAL HUMERUS FRACTURE Stable overnight.  OT, and possibly PT today for mobilization and recommendations regarding discharge.  I would think as she lives alone a short term rehab stint would be appropriate.  She wants to go home to take care of pets. Will need therapy input on this from safety standpoint. Strict NWB left UE  Doran Heater. Veverly Fells, MD 03/02/2015 7:55 AM

## 2015-03-03 LAB — GLUCOSE, CAPILLARY
GLUCOSE-CAPILLARY: 97 mg/dL (ref 65–99)
GLUCOSE-CAPILLARY: 135 mg/dL — AB (ref 65–99)
GLUCOSE-CAPILLARY: 99 mg/dL (ref 65–99)

## 2015-03-03 NOTE — Progress Notes (Signed)
Orthopedics Progress Note  Subjective: I feel like I will be fine at home.  My son is coming to stay with me.  Objective:  Filed Vitals:   03/03/15 0445  BP: 126/57  Pulse: 65  Temp: 97.5 F (36.4 C)  Resp: 17    General: Awake and alert  Musculoskeletal: left shoulder dressing changed, no erythema, no drainage, min swelling Neurovascularly intact  Lab Results  Component Value Date   WBC 8.3 11/12/2012   HGB 9.8* 03/02/2015   HCT 31.1* 03/02/2015   MCV 81.8 11/12/2012   PLT 330 11/12/2012       Component Value Date/Time   NA 139 03/02/2015 0346   K 4.5 03/02/2015 0346   CL 103 03/02/2015 0346   CO2 26 03/02/2015 0346   GLUCOSE 158* 03/02/2015 0346   BUN 12 03/02/2015 0346   CREATININE 0.94 03/02/2015 0346   CREATININE 0.87 10/04/2014 1557   CALCIUM 8.4* 03/02/2015 0346   GFRNONAA >60 03/02/2015 0346   GFRNONAA 74 10/04/2014 1557   GFRAA >60 03/02/2015 0346   GFRAA 85 10/04/2014 1557    Lab Results  Component Value Date   INR 1.0 01/14/2007    Assessment/Plan: POD #2 s/p Procedure(s): OPEN REDUCTION INTERNAL FIXATION (ORIF) PROXIMAL HUMERUS FRACTURE Stable at this point for discharge as long as there is 24 hour supervision. D/C with gentle home exercises and no weight bearing with the left UE  Doran Heater. Veverly Fells, MD 03/03/2015 9:58 AM

## 2015-03-03 NOTE — Care Management Note (Signed)
Case Management Note  Patient Details  Name: Virginia Romero MRN: [id number] Date of Birth: Sep 19, 1955  Subjective/Objective:               S/p ORIF left shoulder     Action/Plan: Spoke with patient about being able to afford medications.Confirmed that she has [employer] and gave her information from needymeds.org about patient assistance programs and coupons for some of her medications. Patient stated that she would look into signing up for patient assistance programs. OT worked with patient, no home therapy or equipment recommended.    Expected Discharge Date:                  Expected Discharge Plan:  Home/Self Care  In-House Referral:     Discharge planning Services  CM Consult, Medication Assistance  Post Acute Care Choice:    Choice offered to:     DME Arranged:    DME Agency:     HH Arranged:    [place] Agency:     Status of Service:  Completed, signed off  Medicare Important Message Given:    Date Medicare IM Given:    Medicare IM give by:    Date Additional Medicare IM Given:    Additional Medicare Important Message give by:     If discussed at [place] of Stay Meetings, dates discussed:    Additional Comments:  Virginia Nephew, RN 03/03/2015, 10:56 AM

## 2015-03-03 NOTE — Progress Notes (Signed)
Pt states that she is currently not having any suicidal thoughts. Per pt "I'm over that". Will continue to monitor. Pt states that she is going to have her son come an stay with her once she is discharged.

## 2015-03-03 NOTE — Discharge Summary (Signed)
Physician Discharge Summary   Patient ID: Virginia Romero MRN: [id number] DOB/AGE: May 26, 1956 59 y.o.  Admit date: 03/01/2015 Discharge date: 03/03/2015  Admission Diagnoses:  Active Problems:   Fracture, shoulder   Discharge Diagnoses:  Same   Surgeries: Procedure(s): OPEN REDUCTION INTERNAL FIXATION (ORIF) PROXIMAL HUMERUS FRACTURE on 03/01/2015   Consultants: OT  Discharged Condition: Stable  Hospital Course: Virginia Romero is an 59 y.o. female who was admitted 03/01/2015 with a chief complaint of left shoulder pain, and found to have a diagnosis of left displaced proximal humerus fracture.  They were brought to the operating room on 03/01/2015 and underwent the above named procedures.    The patient had an uncomplicated hospital course and was stable for discharge.  Recent vital signs:  Filed Vitals:   03/03/15 0445  BP: 126/57  Pulse: 65  Temp: 97.5 F (36.4 C)  Resp: 17    Recent laboratory studies:  Results for orders placed or performed during the hospital encounter of 03/01/15  Hemoglobin and hematocrit, blood  Result Value Ref Range   Hemoglobin 9.8 (L) 12.0 - 15.0 g/dL   HCT 31.1 (L) 36.0 - 40.9 %  Basic metabolic panel  Result Value Ref Range   Sodium 139 135 - 145 mmol/L   Potassium 4.5 3.5 - 5.1 mmol/L   Chloride 103 101 - 111 mmol/L   CO2 26 22 - 32 mmol/L   Glucose, Bld 158 (H) 65 - 99 mg/dL   BUN 12 6 - 20 mg/dL   Creatinine, Ser 0.94 0.44 - 1.00 mg/dL   Calcium 8.4 (L) 8.9 - 10.3 mg/dL   GFR calc non Af Amer >60 >60 mL/min   GFR calc Af Amer >60 >60 mL/min   Anion gap 10 5 - 15  Glucose, capillary  Result Value Ref Range   Glucose-Capillary 142 (H) 65 - 99 mg/dL  Glucose, capillary  Result Value Ref Range   Glucose-Capillary 135 (H) 65 - 99 mg/dL  Glucose, capillary  Result Value Ref Range   Glucose-Capillary 136 (H) 65 - 99 mg/dL   Comment 1 Notify RN   Glucose, capillary  Result Value Ref Range   Glucose-Capillary 145 (H) 65 - 99  mg/dL   Comment 1 Notify RN   Glucose, capillary  Result Value Ref Range   Glucose-Capillary 153 (H) 65 - 99 mg/dL  Glucose, capillary  Result Value Ref Range   Glucose-Capillary 97 65 - 99 mg/dL  I-STAT, chem 8  Result Value Ref Range   Sodium 141 135 - 145 mmol/L   Potassium 3.7 3.5 - 5.1 mmol/L   Chloride 101 101 - 111 mmol/L   BUN 14 6 - 20 mg/dL   Creatinine, Ser 0.90 0.44 - 1.00 mg/dL   Glucose, Bld 81 65 - 99 mg/dL   Calcium, Ion 1.19 1.12 - 1.23 mmol/L   TCO2 26 0 - 100 mmol/L   Hemoglobin 13.9 12.0 - 15.0 g/dL   HCT 41.0 36.0 - 46.0 %    Discharge Medications:     Medication List    STOP taking these medications        diclofenac 75 MG EC tablet  Commonly known as:  VOLTAREN     ibuprofen 200 MG tablet  Commonly known as:  ADVIL,MOTRIN      TAKE these medications        ALPRAZolam 1 MG tablet  Commonly known as:  XANAX  take 1 tablet by mouth twice a day if needed  citalopram 20 MG tablet  Commonly known as:  CELEXA  Take 2 tablets every night for depression.     hydrochlorothiazide 12.5 MG tablet  Commonly known as:  HYDRODIURIL  Take 1 tablet (12.5 mg total) by mouth daily.     HYDROcodone-acetaminophen 5-325 MG tablet  Commonly known as:  NORCO  Take 1 tablet by mouth every 6 (six) hours as needed.     HYDROcodone-acetaminophen 5-325 MG tablet  Commonly known as:  NORCO  Take 1-2 tablets by mouth every 6 (six) hours as needed for moderate pain.     levothyroxine 175 MCG tablet  Commonly known as:  SYNTHROID, LEVOTHROID  Take 175 mcg by mouth daily before breakfast.     levothyroxine 25 MCG tablet  Commonly known as:  LEVOTHROID  Take 1 tablet (25 mcg total) by mouth daily before breakfast. Take in addition to 150 mcg levothyroxine for a total of 175 mcg.     lisinopril 2.5 MG tablet  Commonly known as:  PRINIVIL,ZESTRIL  Take 1 tablet (2.5 mg total) by mouth daily.     metFORMIN 500 MG tablet  Commonly known as:  GLUCOPHAGE  Take 1  tablet (500 mg total) by mouth daily with breakfast.     methocarbamol 500 MG tablet  Commonly known as:  ROBAXIN  Take 1 tablet (500 mg total) by mouth 3 (three) times daily as needed.     metoprolol 100 MG tablet  Commonly known as:  LOPRESSOR  TAKE ONE TABLET BY MOUTH TWICE DAILY     Vitamin D (Ergocalciferol) 50000 UNITS Caps capsule  Commonly known as:  DRISDOL  Take 1 capsule (50,000 Units total) by mouth every 7 (seven) days.        Diagnostic Studies: Dg Shoulder Left  03/01/2015   CLINICAL DATA:  Left humeral neck fracture.  Subsequent encounter.  EXAM: DG C-ARM 61-120 MIN; LEFT SHOULDER - 2+ VIEW  COMPARISON:  None.  FINDINGS: A series of 4 spot images obtained intraoperatively show placement of fixation plate and screws transfixing a proximal humeral fracture in near anatomic alignment.  IMPRESSION: Internal fixation of proximal humeral fracture in near anatomic alignment.   Electronically Signed   By: Earle Gell M.D.   On: 03/01/2015 19:01   Dg Shoulder Left  02/23/2015   CLINICAL DATA:  [place] today onto the left side.  Left shoulder pain.  EXAM: LEFT SHOULDER - 2+ VIEW  COMPARISON:  None.  FINDINGS: There is a comminuted fracture of the left proximal humerus. There is a transverse fracture across metaphysis, with approximately 13 mm anterior displacement. There is a second fracture across the base of the greater tuberosity, displaced laterally by approximately 1 cm. Humeral head is also subluxed inferiorly in relation to the glenoid consistent with a hemarthrosis.  AC joint is normally aligned.  There is surrounding soft tissue swelling.  IMPRESSION: Comminuted mildly displaced fracture of the proximal left humerus as detailed above with mild inferior subluxation of the left humeral head consistent with a left shoulder joint hemarthrosis.   Electronically Signed   By: Lajean Manes M.D.   On: 02/23/2015 17:35   Dg Shoulder Left Port  03/01/2015   CLINICAL DATA:  Left shoulder  fracture, closed, postop. Initial encounter.  EXAM: LEFT SHOULDER - 1 VIEW  COMPARISON:  Preoperative exam 02/23/2015  FINDINGS: Single portable AP view of the left shoulder demonstrates plate and multi screw fixation of displaced proximal humerus fracture. There is improved fracture alignment with decreased displacement  compared to preoperative exam. The acromioclavicular joint remains congruent.  IMPRESSION: Improved proximal humerus fracture alignment post ORIF.   Electronically Signed   By: Jeb Levering M.D.   On: 03/01/2015 20:16   Dg Knee Complete 4 Views Left  02/23/2015   CLINICAL DATA:  59 year old female with history of trauma from a fall onto the left knee earlier today complaining of left knee pain.  EXAM: LEFT KNEE - COMPLETE 4+ VIEW  COMPARISON:  Left knee radiographs 11/19/2012.  FINDINGS: No acute displaced fracture, subluxation or dislocation. Severe joint space narrowing, subchondral sclerosis, subchondral cyst formation and osteophyte formation is noted in a tricompartmental distribution, most severe in the medial and patellofemoral compartments.  IMPRESSION: 1. No acute radiographic abnormality of the left knee. 2. Severe tricompartmental osteoarthritis.   Electronically Signed   By: Vinnie Langton M.D.   On: 02/23/2015 17:36   Dg C-arm 1-60 Min  03/01/2015   CLINICAL DATA:  Left humeral neck fracture.  Subsequent encounter.  EXAM: DG C-ARM 61-120 MIN; LEFT SHOULDER - 2+ VIEW  COMPARISON:  None.  FINDINGS: A series of 4 spot images obtained intraoperatively show placement of fixation plate and screws transfixing a proximal humeral fracture in near anatomic alignment.  IMPRESSION: Internal fixation of proximal humeral fracture in near anatomic alignment.   Electronically Signed   By: Earle Gell M.D.   On: 03/01/2015 19:01    Disposition: 01-Home or Self Care        Follow-up Information    Follow up with NORRIS,STEVEN R, MD. Call in 2 weeks.   Specialty:  Orthopedic Surgery    Why:  [phone]   Contact information:   [address] [place] [address] [phone]        Signed: Augustin Schooling 03/03/2015, 10:03 AM

## 2015-03-03 NOTE — Progress Notes (Signed)
Occupational Therapy Treatment Patient Details Name: Virginia Romero MRN: [id number] DOB: [birth year] Today's Date: 03/03/2015    History of present illness LEFT OPEN REDUCTION INTERNAL FIXATION (ORIF) PROXIMAL HUMERUS FRACTURE   OT comments  Pt making very slow progress toward OT goals, limited by pain today. Reviewed compensatory strategies for UB ADLs, sling management and wear schedule, NWB on LUE and precautions. Pt demonstrating decreased recall and difficulty adhering to NWB/precautions during functional activities. Completed hand, wrist, elbow AROM and lap slides with LUE. Pt unable to complete supported pendulum exercises at this time. Continue to follow acutely.     Follow Up Recommendations  Supervision/Assistance - 24 hour    Equipment Recommendations  None recommended by OT    Recommendations for Other Services      Precautions / Restrictions Precautions Precautions: Shoulder;Fall;Other (comment) (suicide precuations) Type of Shoulder Precautions: Conservative protocol: supported pendulums, lap slides, no PROM/AROM of shoulder. AROM wrist, hand, elbow OK Shoulder Interventions: Shoulder sling/immobilizer;At all times;Off for dressing/bathing/exercises Precaution Comments: Pt demonstrating difficulty adhering to precautions.  Required Braces or Orthoses: Sling Restrictions Weight Bearing Restrictions: Yes LUE Weight Bearing: Non weight bearing       Mobility Bed Mobility Overal bed mobility: Needs Assistance Bed Mobility: Supine to Sit;Sit to Supine     Supine to sit: Min guard Sit to supine: Min guard   General bed mobility comments: Not adhering to NWB and shoulder precautions during bed mobility. Max verbal cues for technique, pt impulsive and not followin cues  Transfers                      Balance                                   ADL Overall ADL's : Needs assistance/impaired                                        General ADL Comments: Reviewed compensatory strategies for ADLs, sling mangement and wear schedule, precautions and NWB status. Pt with decreased ability to remember discussing these yesterday.      Vision                     Perception     Praxis      Cognition   Behavior During Therapy: Impulsive Overall Cognitive Status: Within Functional Limits for tasks assessed       Memory: Decreased recall of precautions               Extremity/Trunk Assessment               Exercises Shoulder Exercises Elbow Flexion: AROM;Left;10 reps;Supine (HOB elevated ) Wrist Flexion: AROM;Left;10 reps;Supine (HOB elevated ) Digit Composite Flexion: AROM;Left;10 reps;Supine (HOB elevated)   Shoulder Instructions       General Comments      Pertinent Vitals/ Pain       Pain Assessment: 0-10 Pain Score: 9  Pain Location: L shoulder  Pain Descriptors / Indicators: Aching;Grimacing;Guarding;Moaning Pain Intervention(s): Limited activity within patient's tolerance;Monitored during session;Patient requesting pain meds-RN notified;Repositioned  Home Living  Prior Functioning/Environment              Frequency Min 3X/week     Progress Toward Goals  OT Goals(current goals can now be found in the care plan section)  Progress towards OT goals: Progressing toward goals  Acute Rehab OT Goals Patient Stated Goal: none stated  Plan Discharge plan remains appropriate    Co-evaluation                 End of Session Equipment Utilized During Treatment: Other (comment) (sling)   Activity Tolerance Patient limited by pain   Patient Left in bed;with call bell/phone within reach;with nursing/sitter in room   Nurse Communication Patient requests pain meds        Time: 1202-1223 OT Time Calculation (min): 21 min  Charges: OT General Charges $OT Visit: 1 Procedure OT Treatments $Therapeutic  Exercise: 8-22 mins  Binnie Kand M.S., OTR/L Pager: [phone]  03/03/2015, 2:22 PM

## 2015-03-06 ENCOUNTER — Other Ambulatory Visit: Payer: Self-pay

## 2015-03-06 ENCOUNTER — Other Ambulatory Visit: Payer: Self-pay | Admitting: Specialist

## 2015-03-06 DIAGNOSIS — S42292A Other displaced fracture of upper end of left humerus, initial encounter for closed fracture: Secondary | ICD-10-CM

## 2015-03-06 MED ORDER — METOPROLOL TARTRATE 100 MG PO TABS: ORAL_TABLET | ORAL | Status: DC

## 2015-03-26 ENCOUNTER — Telehealth: Payer: Self-pay | Admitting: Family Medicine

## 2015-03-26 NOTE — Telephone Encounter (Signed)
lmom of pt  new appt time for 04/26/15 is at 8:15 instead of 8:00

## 2015-03-30 ENCOUNTER — Encounter: Payer: Self-pay | Admitting: Family Medicine

## 2015-03-30 LAB — HM DIABETES EYE EXAM

## 2015-04-16 ENCOUNTER — Other Ambulatory Visit: Payer: Self-pay | Admitting: Family Medicine

## 2015-04-17 NOTE — Telephone Encounter (Signed)
Called into pharmacy 04/17/15.

## 2015-04-19 ENCOUNTER — Other Ambulatory Visit: Payer: Self-pay | Admitting: Family Medicine

## 2015-04-21 NOTE — Telephone Encounter (Signed)
Called pt who verified that she is not completely out of her thyroid med and can wait until after appt on 11/23 so that we can send in correct dosage after labs.

## 2015-04-26 ENCOUNTER — Encounter: Payer: Self-pay | Admitting: Family Medicine

## 2015-04-26 ENCOUNTER — Ambulatory Visit (INDEPENDENT_AMBULATORY_CARE_PROVIDER_SITE_OTHER): Payer: 59 | Admitting: Family Medicine

## 2015-04-26 VITALS — BP 136/77 | HR 61 | Temp 98.1°F | Resp 16 | Ht 62.0 in | Wt 208.6 lb

## 2015-04-26 DIAGNOSIS — M25562 Pain in left knee: Secondary | ICD-10-CM

## 2015-04-26 DIAGNOSIS — M25561 Pain in right knee: Secondary | ICD-10-CM | POA: Diagnosis not present

## 2015-04-26 DIAGNOSIS — D539 Nutritional anemia, unspecified: Secondary | ICD-10-CM | POA: Diagnosis not present

## 2015-04-26 DIAGNOSIS — E034 Atrophy of thyroid (acquired): Secondary | ICD-10-CM

## 2015-04-26 DIAGNOSIS — F419 Anxiety disorder, unspecified: Secondary | ICD-10-CM

## 2015-04-26 DIAGNOSIS — E1169 Type 2 diabetes mellitus with other specified complication: Secondary | ICD-10-CM

## 2015-04-26 DIAGNOSIS — E669 Obesity, unspecified: Secondary | ICD-10-CM

## 2015-04-26 DIAGNOSIS — E785 Hyperlipidemia, unspecified: Secondary | ICD-10-CM

## 2015-04-26 DIAGNOSIS — F418 Other specified anxiety disorders: Secondary | ICD-10-CM | POA: Diagnosis not present

## 2015-04-26 DIAGNOSIS — F32A Depression, unspecified: Secondary | ICD-10-CM

## 2015-04-26 DIAGNOSIS — E038 Other specified hypothyroidism: Secondary | ICD-10-CM

## 2015-04-26 DIAGNOSIS — F329 Major depressive disorder, single episode, unspecified: Secondary | ICD-10-CM

## 2015-04-26 LAB — TSH: TSH: 5.009 u[IU]/mL — ABNORMAL HIGH (ref 0.350–4.500)

## 2015-04-26 LAB — CBC
HEMATOCRIT: 36.5 % (ref 36.0–46.0)
Hemoglobin: 12 g/dL (ref 12.0–15.0)
MCH: 28.8 pg (ref 26.0–34.0)
MCHC: 32.9 g/dL (ref 30.0–36.0)
MCV: 87.5 fL (ref 78.0–100.0)
MPV: 10.3 fL (ref 8.6–12.4)
Platelets: 377 10*3/uL (ref 150–400)
RBC: 4.17 MIL/uL (ref 3.87–5.11)
RDW: 15.1 % (ref 11.5–15.5)
WBC: 7.3 10*3/uL (ref 4.0–10.5)

## 2015-04-26 LAB — HEMOGLOBIN A1C
HEMOGLOBIN A1C: 6.2 % — AB (ref <5.7)
Mean Plasma Glucose: 131 mg/dL — ABNORMAL HIGH (ref <117)

## 2015-04-26 MED ORDER — ALPRAZOLAM 1 MG PO TABS: 1.0000 mg | ORAL_TABLET | Freq: Three times a day (TID) | ORAL | Status: DC | PRN

## 2015-04-26 MED ORDER — MELOXICAM 7.5 MG PO TABS: 7.5000 mg | ORAL_TABLET | Freq: Every day | ORAL | Status: DC

## 2015-04-26 NOTE — Patient Instructions (Addendum)
Please stop diclofenac and start meloxicam for your knee pain Can take Xanax up to three times a day as needed for anxiety - we can try this for two months then back off

## 2015-04-26 NOTE — Progress Notes (Signed)
Subjective:    Patient ID: Virginia Romero, female    DOB: 02-22-1956, 59 y.o.   MRN: [id number]  HPI Patient has a rough couple of months since I saw her 51/16. She had a fall 9/22 and broke her humeral head. She fell sideways on her front steps which are made of large stones. She had surgery 03/01/15. She has follow up with Dr. Veverly Fells in about 2 weeks. She continues to have her arm in a sling. Has not had any physical therapy. She has not required narcotic pain medication recently for pain.   She has had more bilateral knee pain. Has been diagnosed with arthritis. Has been taking diclofenac twice a day without relief. Has not tried ice/heat or wrapping. No instability. Xray of left knee following fall showed severe tricompartmental osteoarthritis. Xray of right knee 6/14 showed also showed tricompartmental osteoarthritis.   She stopped her antidepressant medication. She has been off for about a month. Feels clearer. Does not think her depression is worse, although her anxiety is worse. She is taking 2-3 Xanax daily. Sleeping ok if she doesn't roll onto bad shoulder. Continues to morn the death of her husband which happened 2 years ago and the more recent death of her elderly dog. According to the patient, she was placed on a suicide watch while in the hospital after openly saying she thinks about suicide. She tells me that sometimes she is very sad, but that she would "never do that to my children." Her son who lives in [place] has been coming weekly to help her and fill her medication box and prepare meals. Her long time therapist retired, but she has an upcoming appointment with someone new.    Review of Systems  Respiratory: Negative for cough and shortness of breath.   Cardiovascular: Negative for chest pain and leg swelling.  Gastrointestinal: Negative for abdominal pain, diarrhea and constipation.  Musculoskeletal: Positive for arthralgias (bilateral knees and left shoulder).    Psychiatric/Behavioral: Positive for dysphoric mood. Negative for sleep disturbance. The patient is nervous/anxious.       Objective:   Physical Exam Physical Exam  Constitutional: Oriented to person, place, and time. She appears well-developed and well-nourished.  HENT:  Head: Normocephalic and atraumatic.  Eyes: Conjunctivae are normal.  Neck: Normal range of motion. Neck supple.  Cardiovascular: Normal rate, regular rhythm and normal heart sounds.   Pulmonary/Chest: Effort normal and breath sounds normal.  Musculoskeletal: Left arm in sling. Knees without edema. Slightly decreased ROM, some crepitus. No instability.   Neurological: Alert and oriented to person, place, and time.  Skin: Skin is warm and dry.  Psychiatric: Normal mood and affect. Behavior is normal. Judgment and thought content normal.  Vitals reviewed.  BP 136/77 mmHg  Pulse 61  Temp(Src) 98.1 F (36.7 C) (Oral)  Resp 16  Ht 5\' 2"  (1.575 m)  Wt 208 lb 9.6 oz (94.62 kg)  BMI 38.14 kg/m2 Wt Readings from Last 3 Encounters:  04/26/15 208 lb 9.6 oz (94.62 kg)  03/01/15 200 lb (90.719 kg)  02/23/15 200 lb (90.719 kg)      Assessment & Plan:  1. Hypothyroidism due to acquired atrophy of thyroid - TSH  2. DM type 2 with diabetic dyslipidemia (HCC) - Hemoglobin A1c  3. Anxiety and depression - Discussed short term increase in xanax for 1-2 months then she is to start decreasing, reviewed dependence with benzos and potential for sedation - ALPRAZolam (XANAX) 1 MG tablet; Take 1 tablet (1 mg  total) by mouth 3 (three) times daily as needed for anxiety.  Dispense: 90 tablet; Refill: 1 - resume therapy as planned  4. Obesity - discussed her inactivity due to shoulder injury and knee pain, encouraged her to decrease food portions  5. Arthralgia of both lower legs - patient with sever arthritis - she has upcoming appointment with Dr. Veverly Fells- suggested she discuss with him and possibly make additional appointment  to discuss her knee pain - can try ice/heat/wrapping for comfort - will try meloxicam instead of diclofenac - meloxicam (MOBIC) 7.5 MG tablet; Take 1 tablet (7.5 mg total) by mouth daily.  Dispense: 30 tablet; Refill: 1  6. Deficiency anemia - she had slightly decreased hgb/hct following surgery - CBC  - follow up 3 months  Clarene Reamer, FNP-BC  [facility]  04/30/2015 2:29 PM

## 2015-04-27 ENCOUNTER — Other Ambulatory Visit: Payer: Self-pay | Admitting: Family Medicine

## 2015-04-27 DIAGNOSIS — E038 Other specified hypothyroidism: Secondary | ICD-10-CM

## 2015-04-27 MED ORDER — LEVOTHYROXINE SODIUM 200 MCG PO TABS: 200.0000 ug | ORAL_TABLET | Freq: Every day | ORAL | Status: DC

## 2015-05-04 ENCOUNTER — Telehealth: Payer: Self-pay | Admitting: Family Medicine

## 2015-05-04 NOTE — Telephone Encounter (Signed)
Pt advised and understood. 

## 2015-05-04 NOTE — Telephone Encounter (Signed)
Patient wants to know if she can take extra strength Tylenol or Advil with all the medications that she is on.   [phone]

## 2015-05-04 NOTE — Telephone Encounter (Signed)
She can take 2 extra strength tylenol every 8 hours. Should not take any ibuprofen/advil/naproxen while taking meloxicam. If the pain is from her shoulder, she should see what Dr. Veverly Fells recommends for pain control.

## 2015-05-19 ENCOUNTER — Telehealth: Payer: Self-pay

## 2015-05-19 NOTE — Telephone Encounter (Signed)
Waiting on payment of $28.25 for 45 pages from [place]

## 2015-05-22 NOTE — Telephone Encounter (Signed)
Received credit card payment over the phone for records.I have written down the card information and the payment needs to be posted.

## 2015-05-23 ENCOUNTER — Other Ambulatory Visit: Payer: Self-pay | Admitting: Family Medicine

## 2015-05-26 DIAGNOSIS — Z0271 Encounter for disability determination: Secondary | ICD-10-CM

## 2015-05-31 ENCOUNTER — Telehealth: Payer: Self-pay

## 2015-05-31 NOTE — Telephone Encounter (Signed)
Spoke with pt, she states she needs a referral letter from her PCP that she is still supposed to be seeing the orthopedist so [employer] can pay for her claims. She is switching insurance.

## 2015-05-31 NOTE — Telephone Encounter (Signed)
[employer] will be the patients new insurance coverage for 2017.   She will need a referral letter for her shoulder injury covered under [employer]. She is currently being treated by Valora Corporal for his injury.   Virginia Romero   [phone] (H)

## 2015-06-07 ENCOUNTER — Other Ambulatory Visit: Payer: Self-pay | Admitting: Family Medicine

## 2015-06-07 DIAGNOSIS — S4992XS Unspecified injury of left shoulder and upper arm, sequela: Secondary | ICD-10-CM

## 2015-06-07 NOTE — Telephone Encounter (Signed)
New referral submitted

## 2015-06-08 ENCOUNTER — Telehealth: Payer: Self-pay

## 2015-06-20 ENCOUNTER — Other Ambulatory Visit: Payer: Self-pay

## 2015-06-20 MED ORDER — VITAMIN D (ERGOCALCIFEROL) 1.25 MG (50000 UNIT) PO CAPS: 50000.0000 [IU] | ORAL_CAPSULE | ORAL | Status: DC

## 2015-06-21 ENCOUNTER — Telehealth: Payer: Self-pay | Admitting: *Deleted

## 2015-06-21 ENCOUNTER — Other Ambulatory Visit: Payer: Self-pay

## 2015-06-21 DIAGNOSIS — F329 Major depressive disorder, single episode, unspecified: Secondary | ICD-10-CM

## 2015-06-21 DIAGNOSIS — F419 Anxiety disorder, unspecified: Principal | ICD-10-CM

## 2015-06-21 DIAGNOSIS — F32A Depression, unspecified: Secondary | ICD-10-CM

## 2015-06-21 MED ORDER — ALPRAZOLAM 1 MG PO TABS: 1.0000 mg | ORAL_TABLET | Freq: Three times a day (TID) | ORAL | Status: DC | PRN

## 2015-06-21 NOTE — Telephone Encounter (Signed)
Pharm reqs RF of alprazolam. 

## 2015-06-21 NOTE — Telephone Encounter (Signed)
Faxed prescription for Xanax to [facility] on [address]/ [place], FNP. Confirmation page received at 11:58 pm.

## 2015-06-27 ENCOUNTER — Encounter: Payer: Self-pay | Admitting: Family Medicine

## 2015-06-27 ENCOUNTER — Ambulatory Visit (INDEPENDENT_AMBULATORY_CARE_PROVIDER_SITE_OTHER): Payer: BLUE CROSS/BLUE SHIELD | Admitting: Family Medicine

## 2015-06-27 VITALS — BP 155/82 | HR 70 | Temp 98.0°F | Resp 16 | Ht 62.0 in | Wt 213.0 lb

## 2015-06-27 DIAGNOSIS — S4292XS Fracture of left shoulder girdle, part unspecified, sequela: Secondary | ICD-10-CM

## 2015-06-27 DIAGNOSIS — E785 Hyperlipidemia, unspecified: Secondary | ICD-10-CM | POA: Diagnosis not present

## 2015-06-27 DIAGNOSIS — F418 Other specified anxiety disorders: Secondary | ICD-10-CM | POA: Diagnosis not present

## 2015-06-27 DIAGNOSIS — I10 Essential (primary) hypertension: Secondary | ICD-10-CM

## 2015-06-27 DIAGNOSIS — E1169 Type 2 diabetes mellitus with other specified complication: Secondary | ICD-10-CM | POA: Diagnosis not present

## 2015-06-27 DIAGNOSIS — M179 Osteoarthritis of knee, unspecified: Secondary | ICD-10-CM

## 2015-06-27 DIAGNOSIS — F419 Anxiety disorder, unspecified: Secondary | ICD-10-CM

## 2015-06-27 DIAGNOSIS — F329 Major depressive disorder, single episode, unspecified: Secondary | ICD-10-CM

## 2015-06-27 DIAGNOSIS — M1710 Unilateral primary osteoarthritis, unspecified knee: Secondary | ICD-10-CM

## 2015-06-27 DIAGNOSIS — F32A Depression, unspecified: Secondary | ICD-10-CM

## 2015-06-27 MED ORDER — SERTRALINE HCL 100 MG PO TABS: ORAL_TABLET | ORAL | Status: DC

## 2015-06-27 NOTE — Progress Notes (Signed)
Subjective:    Patient ID: Virginia Romero, female    DOB: 08/04/1955, 60 y.o.   MRN: [id number]  HPI This is a pleasant 60 yo female who presents today for follow up of depression and fatigue.   Her left shoulder continues to hurt and she is taking extra strength tylenol and methocarbamol. She did not like the way the pain medication made her feel. She is going to PT 1-2x per week for her shoulder and is slowly increasing her ROM.   Patient has noticed worsening depression. Her long time therapist retired and she came off her medication several months ago. She feels that medication "doesn't change your circumstances." She would like to find a new therapist. She is open to trying medication again and thinks she did well on sertraline, remembering only that she had vivid, but not disturbing, dreams. She denies that she would every hurt herself, stating that she loves her children and would never do that to them. She really misses her work at [employer] and has isolated herself. She has some friends that she talks to regularly. Her son visits and helps when he can. Her daughter is very busy with her husband and children. She is sleeping ok. Appetite good.   Review of Systems  Constitutional: Positive for fatigue (longstanding).  Respiratory: Negative for cough, chest tightness, shortness of breath and wheezing.   Cardiovascular: Negative for chest pain and leg swelling.  Musculoskeletal: Positive for arthralgias (left shoulder, knees).  Psychiatric/Behavioral: Positive for dysphoric mood. Negative for suicidal ideas, sleep disturbance and self-injury. The patient is nervous/anxious.        Objective:   Physical Exam  Constitutional: She is oriented to person, place, and time. She appears well-developed and well-nourished.  HENT:  Head: Atraumatic.  Eyes: Conjunctivae are normal.  Cardiovascular: Normal rate, regular rhythm and normal heart sounds.   Pulmonary/Chest: Effort normal and  breath sounds normal.  Neurological: She is alert and oriented to person, place, and time.  Skin: Skin is warm and dry.  Psychiatric: She has a normal mood and affect. Her behavior is normal. Judgment and thought content normal.  Occasionally tearful when discussing the deaths of her husband and her brother.   Vitals reviewed.  BP 155/82 mmHg  Pulse 70  Temp(Src) 98 F (36.7 C)  Resp 16  Ht 5\' 2"  (1.575 m)  Wt 213 lb (96.616 kg)  BMI 38.95 kg/m2 Wt Readings from Last 3 Encounters:  06/27/15 213 lb (96.616 kg)  04/26/15 208 lb 9.6 oz (94.62 kg)  03/01/15 200 lb (90.719 kg)      Assessment & Plan:  1. Essential hypertension - continue current meds- HCTZ, lisinopril, metorpolol  2. DM type 2 with diabetic dyslipidemia (HCC) - pravastatin (PRAVACHOL) 40 MG tablet; ; Refill: 0  3. Anxiety and depression - she was provided a list of therapists names and numbers  - sertraline (ZOLOFT) 100 MG tablet; Please take 1/2 tablet for 1 week then increase to 1 tablet.  Dispense: 30 tablet; Refill: 1  4. Osteoarthritis of knee, unspecified laterality, unspecified osteoarthritis type - meloxicam (MOBIC) 7.5 MG tablet; take 1 tablet by mouth once daily with food or milk; Refill: 0- use sparingly   5. Fracture, shoulder, left, sequela - meloxicam (MOBIC) 7.5 MG tablet; take 1 tablet by mouth once daily with food or milk; Refill: 0  - follow up in 2-3 weeks, sooner if worsening mood or medication intolerance  Clarene Reamer, FNP-BC  [facility]  06/29/2015 4:07 PM

## 2015-06-27 NOTE — Patient Instructions (Signed)
[facility] Yvone Neu [phone]- [phone] Vivia Budge- [phone]- [phone] Jeremy Johann- [phone] Burnard Leigh- [phone] Marya Amsler- [phone] Jodi Geralds [phone] Herby Abraham Dennis Gorman) [phone]

## 2015-07-11 ENCOUNTER — Ambulatory Visit (INDEPENDENT_AMBULATORY_CARE_PROVIDER_SITE_OTHER): Payer: BLUE CROSS/BLUE SHIELD | Admitting: Women's Health

## 2015-07-11 ENCOUNTER — Encounter: Payer: Self-pay | Admitting: Women's Health

## 2015-07-11 VITALS — BP 134/80 | Ht 62.0 in | Wt 213.0 lb

## 2015-07-11 DIAGNOSIS — Z01419 Encounter for gynecological examination (general) (routine) without abnormal findings: Secondary | ICD-10-CM

## 2015-07-11 NOTE — Progress Notes (Signed)
Virginia Romero 03/28/1956 [id number]    History:    Presents for annual exam.  Postmenopausal with no bleeding on no HRT. 04/2014 had postmenopausal bleeding with benign endometrial polyp. January 2016 negative sonohysterogram. Normal Pap and mammogram history. 2014 T score -0.2 at the hip. September 2016 fractured left shoulder from traumatic fall on cement steps. Colonoscopy at approximately age 60 with a negative polyp. Diabetes, hypertension, hypothyroidism managed by primary care. Anxiety and depression managed by counselor on Zoloft.  Past medical history, past surgical history, family history and social history were all reviewed and documented in the EPIC chart. Struggles with depression. On disability. Son is helpful lives in [place], daughter lives local reports as very busy. Help care for ex-husband who died of AIDS. 35 year old dog died, grieving death.  ROS:  A ROS was performed and pertinent positives and negatives are included.  Exam:  Filed Vitals:   07/11/15 0939  BP: 134/80    General appearance:  Normal Thyroid:  Symmetrical, normal in size, without palpable masses or nodularity. Respiratory  Auscultation:  Clear without wheezing or rhonchi Cardiovascular  Auscultation:  Regular rate, without rubs, murmurs or gallops  Edema/varicosities:  Not grossly evident Abdominal  Soft,nontender, without masses, guarding or rebound.  Liver/spleen:  No organomegaly noted  Hernia:  None appreciated  Skin  Inspection:  Grossly normal   Breasts: Examined lying and sitting.     Right: Without masses, retractions, discharge or axillary adenopathy.     Left: Without masses, retractions, discharge or axillary adenopathy. Gentitourinary   Inguinal/mons:  Normal without inguinal adenopathy  External genitalia:  Normal  BUS/Urethra/Skene's glands:  Normal  Vagina:  Normal  Cervix:  Normal  Uterus:   normal in size, shape and contour.  Midline and mobile  Adnexa/parametria:      Rt: Without masses or tenderness.   Lt: Without masses or tenderness.  Anus and perineum: Normal  Digital rectal exam: Normal sphincter tone without palpated masses or tenderness  Assessment/Plan:  60 y.o. D WF G2 P2 for annual exam.   02/2015 fractured left shoulder/surgery continued pain and in physical therapy Postmenopausal/no bleeding/no HRT/not sexually active Diabetes/hypertension/hyperthyroidism-primary care manages labs and meds Anxiety and depression and posttraumatic stress syndrome counselor  Plan: SBE's, continue annual screening mammogram due in March will schedule after better mobility of left shoulder. Encouraged to increase regular exercise, decrease carbs in diet. Home safety, fall prevention and importance of regular exercise reviewed. Pap normal 2016, new screening guidelines reviewed.    Huel Cote WHNP, 10:09 AM 07/11/2015

## 2015-07-11 NOTE — Patient Instructions (Signed)

## 2015-07-12 LAB — URINALYSIS W MICROSCOPIC + REFLEX CULTURE
BILIRUBIN URINE: NEGATIVE
Casts: NONE SEEN [LPF]
Crystals: NONE SEEN [HPF]
GLUCOSE, UA: NEGATIVE
Hgb urine dipstick: NEGATIVE
LEUKOCYTES UA: NEGATIVE
NITRITE: NEGATIVE
PH: 5.5 (ref 5.0–8.0)
Protein, ur: NEGATIVE
Specific Gravity, Urine: 1.022 (ref 1.001–1.035)
YEAST: NONE SEEN [HPF]

## 2015-07-13 LAB — URINE CULTURE
COLONY COUNT: NO GROWTH
ORGANISM ID, BACTERIA: NO GROWTH

## 2015-07-17 ENCOUNTER — Other Ambulatory Visit: Payer: Self-pay | Admitting: Family Medicine

## 2015-07-19 ENCOUNTER — Other Ambulatory Visit: Payer: Self-pay

## 2015-07-19 DIAGNOSIS — E785 Hyperlipidemia, unspecified: Principal | ICD-10-CM

## 2015-07-19 DIAGNOSIS — E1169 Type 2 diabetes mellitus with other specified complication: Secondary | ICD-10-CM

## 2015-07-19 MED ORDER — PRAVASTATIN SODIUM 40 MG PO TABS: 40.0000 mg | ORAL_TABLET | Freq: Every day | ORAL | Status: DC

## 2015-07-26 ENCOUNTER — Ambulatory Visit: Payer: 59 | Admitting: Family Medicine

## 2015-07-26 ENCOUNTER — Ambulatory Visit (INDEPENDENT_AMBULATORY_CARE_PROVIDER_SITE_OTHER): Payer: BLUE CROSS/BLUE SHIELD | Admitting: Family Medicine

## 2015-07-26 VITALS — BP 128/79 | HR 69 | Temp 98.3°F | Resp 16 | Ht 62.0 in | Wt 209.0 lb

## 2015-07-26 DIAGNOSIS — F32A Depression, unspecified: Secondary | ICD-10-CM

## 2015-07-26 DIAGNOSIS — F329 Major depressive disorder, single episode, unspecified: Secondary | ICD-10-CM

## 2015-07-26 DIAGNOSIS — F419 Anxiety disorder, unspecified: Principal | ICD-10-CM

## 2015-07-26 DIAGNOSIS — I10 Essential (primary) hypertension: Secondary | ICD-10-CM

## 2015-07-26 DIAGNOSIS — F418 Other specified anxiety disorders: Secondary | ICD-10-CM

## 2015-07-26 NOTE — Patient Instructions (Signed)
Please call in April for an appointment in May for follow up your diabetes

## 2015-07-26 NOTE — Progress Notes (Signed)
Subjective:    Patient ID: Virginia Romero, female    DOB: Mar 14, 1956, 60 y.o.   MRN: [id number]  HPI This is a pleasant 60 yo female who presents today for follow up of depression. She was started on Zoloft 06/27/15 and reports she is feeling much improved with her mood. She is no longer thinking about dying and has more energy. She has not yet made an appointment with a therapist because she is in frequent PT for her shoulder and can not afford additional medical expenses at this time.   She is seeing slow progress with her shoulder recovery. Pain manageable, sleeping ok. Her elderly dog has had some recent health decline, but is doing ok.   Past Medical History  Diagnosis Date  . History of ovarian cyst   . Endometrial polyp   . Hypertension   . Cancer (HCC)     THYROID  . Depression   . Anxiety   . PTSD (post-traumatic stress disorder)   . Thyroid disease   . Arthritis     KNEES AND LOWER BACK   . 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 Diabetes mellitus     Type II  . Shortness of breath dyspnea     with anxiety  . Hyperlipemia   . GERD (gastroesophageal reflux disease)    Past Surgical History  Procedure Laterality Date  . Thyroidectomy  2000  . Tubal ligation    . Diagnostic laparoscopy  07/17/2006    WITH LYSIS OF PERITUBULAR AND PERIOVARIAN ADHESIONS, LYSIS OF OMENTAL ADHESIONS, LSO, RIGHT  OVARIAN CYSTECTOMY , HYSTEROSCOPY  WITH EXCISION OF ENDOMETRIAL POLYP.Marland Kitchen.  . Cesarean section  '79 AND '86    X2  . Colonoscopy      with polypectomy  . Orif humerus fracture Left 03/01/2015    Procedure: OPEN REDUCTION INTERNAL FIXATION (ORIF) PROXIMAL HUMERUS FRACTURE;  Surgeon: Netta Cedars, MD;  Location: [facility];  Service: Orthopedics;  Laterality: Left;   Family History  Problem Relation Age of Onset  . Hypertension Mother   . Heart disease Mother   . Diabetes Brother   . Hypertension Maternal Grandmother   . Heart disease Maternal  Grandmother   . Diabetes Maternal Grandfather   . Diabetes Paternal Grandfather    Social History  Substance Use Topics  . Smoking status: Current Every Day Smoker -- 0.50 packs/day for 30 years    Types: Cigarettes  . Smokeless tobacco: Never Used  . Alcohol Use: No      Review of Systems No chest pain, no SOB, no edema    Objective:   Physical Exam Physical Exam  Constitutional: Oriented to person, place, and time. She appears well-developed and well-nourished.  HENT:  Head: Normocephalic and atraumatic.  Eyes: Conjunctivae are normal.  Neck: Normal range of motion. Neck supple.  Cardiovascular: Normal rate, regular rhythm and normal heart sounds.   Pulmonary/Chest: Effort normal and breath sounds normal.  Musculoskeletal: Normal range of motion.  Neurological: Alert and oriented to person, place, and time.  Skin: Skin is warm and dry.  Psychiatric: Normal mood and affect. Behavior is normal. Judgment and thought content normal. Mood much brighter today, she is not tearful.  Vitals reviewed.  BP 128/79 mmHg  Pulse 69  Temp(Src) 98.3 F (36.8 C)  Resp 16  Ht 5\' 2"  (1.575 m)  Wt 209 lb (94.802 kg)  BMI 38.22 kg/m2 Wt Readings from Last 3 Encounters:  07/26/15 209 lb (94.802 kg)  07/11/15 213 lb (96.616 kg)  06/27/15 213 lb (96.616 kg)      Assessment & Plan:  1. Anxiety and depression - much improved on Zoloft, will continue current dose of 100 mg  2. Essential hypertension - well controlled, continue current meds  - follow up in 3 months  Clarene Reamer, FNP-BC  [facility]  07/30/2015 1:32 PM

## 2015-07-30 ENCOUNTER — Encounter: Payer: Self-pay | Admitting: Family Medicine

## 2015-08-16 ENCOUNTER — Other Ambulatory Visit: Payer: Self-pay | Admitting: Family Medicine

## 2015-08-18 NOTE — Telephone Encounter (Signed)
Debbie, do you want to RF these?

## 2015-08-19 ENCOUNTER — Other Ambulatory Visit: Payer: Self-pay | Admitting: Family Medicine

## 2015-08-19 NOTE — Telephone Encounter (Signed)
Phoned into patient's pharmacy

## 2015-10-12 NOTE — Telephone Encounter (Signed)
ERROR

## 2015-10-15 ENCOUNTER — Other Ambulatory Visit: Payer: Self-pay | Admitting: Family Medicine

## 2015-11-13 ENCOUNTER — Other Ambulatory Visit: Payer: Self-pay | Admitting: Family Medicine

## 2015-11-27 ENCOUNTER — Other Ambulatory Visit: Payer: Self-pay | Admitting: Family Medicine

## 2015-11-28 ENCOUNTER — Encounter: Payer: Self-pay | Admitting: Family Medicine

## 2015-11-28 ENCOUNTER — Ambulatory Visit (INDEPENDENT_AMBULATORY_CARE_PROVIDER_SITE_OTHER): Payer: BLUE CROSS/BLUE SHIELD | Admitting: Family Medicine

## 2015-11-28 VITALS — BP 125/70 | HR 74 | Temp 98.4°F | Resp 16 | Ht 62.0 in | Wt 219.0 lb

## 2015-11-28 DIAGNOSIS — E785 Hyperlipidemia, unspecified: Secondary | ICD-10-CM

## 2015-11-28 DIAGNOSIS — F419 Anxiety disorder, unspecified: Secondary | ICD-10-CM

## 2015-11-28 DIAGNOSIS — F32A Depression, unspecified: Secondary | ICD-10-CM

## 2015-11-28 DIAGNOSIS — E1169 Type 2 diabetes mellitus with other specified complication: Secondary | ICD-10-CM

## 2015-11-28 DIAGNOSIS — E034 Atrophy of thyroid (acquired): Secondary | ICD-10-CM

## 2015-11-28 DIAGNOSIS — F418 Other specified anxiety disorders: Secondary | ICD-10-CM

## 2015-11-28 DIAGNOSIS — E038 Other specified hypothyroidism: Secondary | ICD-10-CM

## 2015-11-28 DIAGNOSIS — F329 Major depressive disorder, single episode, unspecified: Secondary | ICD-10-CM

## 2015-11-28 DIAGNOSIS — I10 Essential (primary) hypertension: Secondary | ICD-10-CM | POA: Diagnosis not present

## 2015-11-28 LAB — TSH: TSH: 1.11 mIU/L

## 2015-11-28 LAB — HEMOGLOBIN A1C
Hgb A1c MFr Bld: 6.5 % — ABNORMAL HIGH (ref <5.7)
Mean Plasma Glucose: 140 mg/dL

## 2015-11-28 MED ORDER — SERTRALINE HCL 100 MG PO TABS: 100.0000 mg | ORAL_TABLET | Freq: Every day | ORAL | Status: DC

## 2015-11-28 NOTE — Patient Instructions (Addendum)
Please call your insurance company to see if there are local therapists who are covered or if they offer therapy over the telephone  [facility] [phone]     IF you received an x-ray today, you will receive an invoice from [facility]. Please contact [facility] at [phone] with questions or concerns regarding your invoice.   IF you received labwork today, you will receive an invoice from [employer]. Please contact [facility] at [phone] with questions or concerns regarding your invoice.   Our billing staff will not be able to assist you with questions regarding bills from these companies.  You will be contacted with the lab results as soon as they are available. The fastest way to get your results is to activate your My Chart account. Instructions are located on the last page of this paperwork. If you have not heard from [place] regarding the results in 2 weeks, please contact this office.

## 2015-11-28 NOTE — Progress Notes (Signed)
Subjective:    Patient ID: Virginia Romero, female    DOB: [birth year], 60 y.o.   MRN: [id number]  HPI This is a pleasant 60 yo female who presents today for follow up of depression and anxiety, HTN, hypothyroidism, DM2.   She continues to have a great deal of shoulder pain following shattered left shoulder. Ortho, Dr. Netta Cedars, pleased with progress. She does PT exercises at home. She is socially isolated since she stopped working and going to church. She does a little bit of gardening and house keeping. She has an elderly dog. Her pharmacy is closing and there was a problem with her sertraline and she was unable to get it refilled and has been without it for several days. She found a therapist that she really likes but can't afford the $150 per hour cost. Her son comes to visit from [place] often. She has financial stressors being on a limited income.   Tolerating medication well without side effects.   Doesn't check blood sugars at home. Has been drinking more water. Is surprised with weight gain today.    Past Medical History  Diagnosis Date  . History of ovarian cyst   . Endometrial polyp   . Hypertension   . Cancer (HCC)     THYROID  . Depression   . Anxiety   . PTSD (post-traumatic stress disorder)   . Thyroid disease   . Arthritis     KNEES AND LOWER BACK   . 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 Diabetes mellitus     Type II  . Shortness of breath dyspnea     with anxiety  . Hyperlipemia   . GERD (gastroesophageal reflux disease)    Past Surgical History  Procedure Laterality Date  . Thyroidectomy  2000  . Tubal ligation    . Diagnostic laparoscopy  07/17/2006    WITH LYSIS OF PERITUBULAR AND PERIOVARIAN ADHESIONS, LYSIS OF OMENTAL ADHESIONS, LSO, RIGHT  OVARIAN CYSTECTOMY , HYSTEROSCOPY  WITH EXCISION OF ENDOMETRIAL POLYP.Marland Kitchen.  . Cesarean section  '79 AND '86    X2  . Colonoscopy      with polypectomy  . Orif humerus  fracture Left 03/01/2015    Procedure: OPEN REDUCTION INTERNAL FIXATION (ORIF) PROXIMAL HUMERUS FRACTURE;  Surgeon: Netta Cedars, MD;  Location: [facility];  Service: Orthopedics;  Laterality: Left;   Family History  Problem Relation Age of Onset  . Hypertension Mother   . Heart disease Mother   . Diabetes Brother   . Hypertension Maternal Grandmother   . Heart disease Maternal Grandmother   . Diabetes Maternal Grandfather   . Diabetes Paternal Grandfather    Social History  Substance Use Topics  . Smoking status: Current Every Day Smoker -- 0.50 packs/day for 30 years    Types: Cigarettes  . Smokeless tobacco: Never Used  . Alcohol Use: No      Review of Systems No chest pain, no SOB, some ankle edema when on feet for a long time.     Objective:   Physical Exam Physical Exam  Constitutional: Oriented to person, place, and time. She appears well-developed and well-nourished.  HENT:  Head: Normocephalic and atraumatic.  Eyes: Conjunctivae are normal.  Neck: Normal range of motion. Neck supple.  Cardiovascular: Normal rate, regular rhythm and normal heart sounds.   Pulmonary/Chest: Effort normal and breath sounds normal.  Musculoskeletal: Very limited ROM left shoulder.  Neurological: Alert and oriented  to person, place, and time.  Skin: Skin is warm and dry.  Psychiatric: Somewhat flat today. Behavior is normal. Judgment and thought content normal.  Vitals reviewed.  BP 125/70 mmHg  Pulse 74  Temp(Src) 98.4 F (36.9 C) (Oral)  Resp 16  Ht 5\' 2"  (1.575 m)  Wt 219 lb (99.338 kg)  BMI 40.05 kg/m2 Wt Readings from Last 3 Encounters:  11/28/15 219 lb (99.338 kg)  07/26/15 209 lb (94.802 kg)  07/11/15 213 lb (96.616 kg)   Depression screen [facility] 2/9 11/28/2015 06/27/2015 04/26/2015 02/23/2015 01/24/2015  Decreased Interest 3 3 1 2 3   Down, Depressed, Hopeless 3 3 1 3 3   PHQ - 2 Score 6 6 2 5 6   Altered sleeping 1 3 - 2 3  Tired, decreased energy 3 3 - 3 3  Change in appetite  - 3 - 3 3  Feeling bad or failure about yourself  1 3 - 1 3  Trouble concentrating 1 3 - 1 3  Moving slowly or fidgety/restless 0 2 - 0 0  Suicidal thoughts 0 0 - 0 1  PHQ-9 Score 12 23 - 15 22  Difficult doing work/chores - Extremely dIfficult - Somewhat difficult -       Assessment & Plan:  1. Anxiety and depression - affect is a little flatter today, but depression scale actually improved - provided contact information for [facility] who offers services on a sliding scale. Also suggested that she call her insurance provider to see what is available to her (tele therapy or local providers) - sertraline (ZOLOFT) 100 MG tablet; Take 1 tablet (100 mg total) by mouth daily.  Dispense: 90 tablet; Refill: 1 - discussed need for structure in her day and getting out of house. Suggested visit to [employer], resuming church attendance.   2. Essential hypertension - well controlled at 125/70 - VITAMIN D 25 Hydroxy (Vit-D Deficiency, Fractures) - COMPLETE METABOLIC PANEL WITH GFR  3. Hypothyroidism due to acquired atrophy of thyroid - TSH - VITAMIN D 25 Hydroxy (Vit-D Deficiency, Fractures) - COMPLETE METABOLIC PANEL WITH GFR  4. DM type 2 with diabetic dyslipidemia (HCC) - COMPLETE METABOLIC PANEL WITH GFR - Hemoglobin A1c - Microalbumin, urine  - follow up based on labs  Clarene Reamer, FNP-BC  [facility]  11/28/2015 10:05 PM

## 2015-11-29 ENCOUNTER — Encounter: Payer: Self-pay | Admitting: Family Medicine

## 2015-11-29 LAB — COMPLETE METABOLIC PANEL WITH GFR
ALT: 13 U/L (ref 6–29)
AST: 13 U/L (ref 10–35)
Albumin: 4.1 g/dL (ref 3.6–5.1)
Alkaline Phosphatase: 96 U/L (ref 33–130)
BILIRUBIN TOTAL: 0.2 mg/dL (ref 0.2–1.2)
BUN: 17 mg/dL (ref 7–25)
CO2: 24 mmol/L (ref 20–31)
CREATININE: 1.02 mg/dL (ref 0.50–1.05)
Calcium: 9.4 mg/dL (ref 8.6–10.4)
Chloride: 103 mmol/L (ref 98–110)
GFR, EST AFRICAN AMERICAN: 70 mL/min (ref >=60)
GFR, Est Non African American: 60 mL/min (ref >=60)
GLUCOSE: 89 mg/dL (ref 65–99)
Potassium: 4.3 mmol/L (ref 3.5–5.3)
SODIUM: 141 mmol/L (ref 135–146)
TOTAL PROTEIN: 6.7 g/dL (ref 6.1–8.1)

## 2015-11-29 LAB — MICROALBUMIN, URINE: MICROALB UR: 1.7 mg/dL

## 2015-11-29 LAB — VITAMIN D 25 HYDROXY (VIT D DEFICIENCY, FRACTURES): VIT D 25 HYDROXY: 40 ng/mL (ref 30–100)

## 2015-12-01 ENCOUNTER — Telehealth: Payer: Self-pay

## 2015-12-01 NOTE — Telephone Encounter (Addendum)
Please advise that her labs look great.  Her kidney and liver function are doing fine.  a1c is a bit elevated from 6.5 from 6.2 7 months ago.  See how she is doing.  Remind her to look into the counseling center if she has not done so, and if she would like [place] to place a referral into a counselor she found on her insurance, let us know.     No change in her diabetes medication.  Please advise to exercise 4 times per week, for 30 minutes or more, and resistance weight training.   Watch carbs and sugars.

## 2015-12-01 NOTE — Telephone Encounter (Signed)
Hey this pt. Called for lab results was also routed to PA pool unsure if Jackelyn Poling will get message

## 2015-12-04 ENCOUNTER — Encounter: Payer: Self-pay | Admitting: Family Medicine

## 2015-12-06 NOTE — Telephone Encounter (Signed)
Pt. Was informed, she is concerned about being able to pay for a counselor with her insurance

## 2015-12-13 ENCOUNTER — Other Ambulatory Visit: Payer: Self-pay | Admitting: Family Medicine

## 2015-12-29 ENCOUNTER — Telehealth: Payer: Self-pay

## 2015-12-29 NOTE — Telephone Encounter (Signed)
[employer] needs forms completed for this patient to determine her disability claim, I have highlighted the areas that need to be completed and I will place these forms in your box on 12/29/15. She last saw Tor Netters on 11/28/15 so if you need her to come back in to complete these please let me know. And once completed please place in the FMLA/Disability box at the 102 checkout desk within 5-7 business days. Thank you!

## 2016-01-01 ENCOUNTER — Other Ambulatory Visit: Payer: Self-pay | Admitting: Family Medicine

## 2016-01-02 ENCOUNTER — Telehealth: Payer: Self-pay

## 2016-01-02 NOTE — Telephone Encounter (Signed)
Patient request a refill of Levothyroxine 200 MCG. Patient stated pharmacy tried to contact the office for two days. [facility] on [address].

## 2016-01-03 NOTE — Telephone Encounter (Signed)
Refill sent in yesterday

## 2016-01-03 NOTE — Telephone Encounter (Signed)
Ok thank you for the update just make sure when she comes in that I get a copy of the completed forms.

## 2016-01-03 NOTE — Telephone Encounter (Signed)
I attempted to complete the form based off the notes from her previous visits but there is not sufficient data. I called the patient and left her a voice message requesting that she come into clinic before 01/12/2016 so that we can review her progress, perform a physical exam to complete her disability/FMLA in the most accurate way. For now, I will leave her paperwork in my box.

## 2016-01-06 ENCOUNTER — Ambulatory Visit (INDEPENDENT_AMBULATORY_CARE_PROVIDER_SITE_OTHER): Payer: BLUE CROSS/BLUE SHIELD | Admitting: Urgent Care

## 2016-01-06 VITALS — BP 122/82 | HR 72 | Resp 16 | Ht 61.5 in | Wt 215.0 lb

## 2016-01-06 DIAGNOSIS — M25512 Pain in left shoulder: Secondary | ICD-10-CM

## 2016-01-06 DIAGNOSIS — Z8781 Personal history of (healed) traumatic fracture: Secondary | ICD-10-CM

## 2016-01-06 DIAGNOSIS — Z9889 Other specified postprocedural states: Secondary | ICD-10-CM

## 2016-01-06 MED ORDER — METHOCARBAMOL 500 MG PO TABS: 500.0000 mg | ORAL_TABLET | Freq: Three times a day (TID) | ORAL | 5 refills | Status: DC | PRN

## 2016-01-06 NOTE — Telephone Encounter (Signed)
Paperwork scanned and faxed.

## 2016-01-06 NOTE — Patient Instructions (Addendum)
Shoulder Fracture You have a fractured humerus (bone in the upper arm) at the shoulder just below the ball of the shoulder joint. Most of the time the bones of a broken shoulder are in an acceptable position. Usually the injury can be treated with a shoulder immobilizer or sling and swath bandage. These devices support the arm and prevent any shoulder movement. If the bones are not in a good position, then surgery is sometimes needed. Shoulder fractures usually cause swelling, pain, and discoloration around the upper arm initially. They heal in 8-12 weeks with proper treatment. Rest in bed or a reclining chair as long as your shoulder is very painful. Sitting up generally results in less pain at the fracture site. Do not remove your shoulder bandage until your caregiver approves. You may apply ice packs over the shoulder for 20-30 minutes every 2 hours for the next 2-3 days to reduce the pain and swelling. Use your pain medicine as prescribed.  SEEK IMMEDIATE MEDICAL CARE IF:  You develop severe shoulder pain unrelieved by rest and taking pain medicine.  You have pain, numbness, tingling, or weakness in the hand or wrist.  You develop shortness of breath, chest pain, severe weakness, or fainting.  You have severe pain with motion of the fingers or wrist. MAKE SURE YOU:   Understand these instructions.  Will watch your condition.  Will get help right away if you are not doing well or get worse.   This information is not intended to replace advice given to you by your health care provider. Make sure you discuss any questions you have with your health care provider.   Document Released: 06/27/2004 Document Revised: 08/12/2011 Document Reviewed: 09/07/2008 Elsevier Interactive Patient Education 2016 [employer].     IF you received an x-ray today, you will receive an invoice from [facility]. Please contact [facility] at [phone] with questions or concerns regarding  your invoice.   IF you received labwork today, you will receive an invoice from [employer]. Please contact [facility] at [phone] with questions or concerns regarding your invoice.   Our billing staff will not be able to assist you with questions regarding bills from these companies.  You will be contacted with the lab results as soon as they are available. The fastest way to get your results is to activate your My Chart account. Instructions are located on the last page of this paperwork. If you have not heard from [place] regarding the results in 2 weeks, please contact this office.

## 2016-01-06 NOTE — Progress Notes (Signed)
    MRN: [id number] DOB: Jun 09, 1955  Subjective:   Virginia Romero is a 60 y.o. female presenting for follow up on shoulder pain s/p shoulder surgery.   Patient had left shoulder surgery on 02/26/2015. She had a plate, 9 screws placed. Surgery was performed by Dr. Veverly Fells with [facility]. Today, reports ongoing shoulder pain, difficulty lifting her left arm. She has difficulty lifting anything besides hand held items like a pocket book, shampoo. Can't wash her hair with her left arm. She uses Robaxin and Mobic regularly for arthritis and shoulder pain. Patient has completed physical therapy several times. She has not been back to see Dr. Veverly Fells due to her financial limitations. Denies redness, swelling, warmth of her shoulder.   Kippy has a current medication list which includes the following prescription(s): alprazolam, hydrochlorothiazide, levothyroxine, lisinopril, meloxicam, metformin, methocarbamol, metoprolol, metoprolol, pravastatin, sertraline, and vitamin d (ergocalciferol). Also has No Known Allergies.  Mercadez  has a past medical history of Anxiety; Arthritis; Cancer ([place]); Complication of anesthesia; Depression; Diabetes mellitus; Endometrial polyp; GERD (gastroesophageal reflux disease); History of ovarian cyst; Hyperlipemia; Hypertension; PTSD (post-traumatic stress disorder); Shortness of breath dyspnea; and Thyroid disease. Also  has a past surgical history that includes Thyroidectomy (2000); Tubal ligation; Diagnostic laparoscopy (07/17/2006); Cesarean section ('79 AND '86); Colonoscopy; and ORIF humerus fracture (Left, 03/01/2015).  Objective:   Vitals: BP 122/82   Pulse 72   Resp 16   Ht 5' 1.5" (1.562 m)   Wt 215 lb (97.5 kg)   SpO2 98%   BMI 39.97 kg/m   Physical Exam  Constitutional: She is oriented to person, place, and time. She appears well-developed and well-nourished.  Cardiovascular: Normal rate.   Pulmonary/Chest: Effort normal.  Musculoskeletal:       Left  shoulder: She exhibits decreased range of motion (abduction greater than ~30 degrees), tenderness (throughout), spasm and decreased strength (secondary to pain per patient). She exhibits no swelling, no effusion, no crepitus and no deformity.       Arms: Neurological: She is alert and oriented to person, place, and time.  Skin: Skin is warm and dry.   Assessment and Plan :   1. H/O shoulder surgery 2. History of left shoulder fracture 3. Left shoulder pain - Disability forms completed. I discussed treatment plan with patient which involves recheck with Dr. Veverly Fells, physical and medical therapy. Referral is pending. RTC s/p treatment with Dr. Veverly Fells including any physical therapy he refers her to. Patient verbalized understanding.  Jaynee Eagles, PA-C [facility] [phone] 01/06/2016 11:05 AM

## 2016-01-13 ENCOUNTER — Other Ambulatory Visit: Payer: Self-pay | Admitting: Family Medicine

## 2016-01-17 ENCOUNTER — Other Ambulatory Visit: Payer: Self-pay | Admitting: Urgent Care

## 2016-01-17 ENCOUNTER — Telehealth: Payer: Self-pay

## 2016-01-17 NOTE — Telephone Encounter (Signed)
Patient needs to rtc to establish care with a provider for management of her depression and anxiety. Please let patient know that she is on a high dose of Xanax and I am not sure another provider will be willing to do this. It may be recommended to wean her off of this but she will just have to come in and see a provider to discuss all of this.

## 2016-01-17 NOTE — Telephone Encounter (Signed)
Virginia Romero you remember this?

## 2016-01-17 NOTE — Telephone Encounter (Signed)
Pt was seen on 01/06/16 by [facility]. She says she left paperwork with  him to be faxed when completed. She is calling in regards to this. Please advise at [phone]

## 2016-01-17 NOTE — Telephone Encounter (Signed)
Fax request [facility] [address]-Alprazolam 1mg  tabs - [place] pt

## 2016-01-17 NOTE — Telephone Encounter (Signed)
Yes, I filled one out already. I got another sent today following up on the one I sent. I completed it today and placed it in FMLA/Disability box. Thank you!

## 2016-01-19 NOTE — Telephone Encounter (Signed)
I am not comfortable refilling Xanax at this high dose. I will provide a script to take 1mg  twice daily PRN. Patient needs to establish care or seek help with a specialist for these refills and management of her anxiety since neither NP Debbie or Dr. Laney Pastor are working here anymore. She can continue her care with NP Debbie if she wants to look for her through [facility]. Please let her know.

## 2016-01-19 NOTE — Telephone Encounter (Signed)
Virginia Romero pt. Last seen for anxiety 6/27, last RFd for 2 mos on 10/18/15.

## 2016-01-19 NOTE — Telephone Encounter (Signed)
Faxed Rx and advised pt that Mani decreased dose to BID. She stated that Jackelyn Poling will not begin practicing at [facility] until Jan.

## 2016-01-22 NOTE — Telephone Encounter (Signed)
Spoke to pt and gave her Mani's message. She was fine with plan to take xanax just twice daily. She normally did not take it TID anyway.

## 2016-01-24 ENCOUNTER — Other Ambulatory Visit: Payer: Self-pay

## 2016-01-24 MED ORDER — VITAMIN D (ERGOCALCIFEROL) 1.25 MG (50000 UNIT) PO CAPS: 50000.0000 [IU] | ORAL_CAPSULE | ORAL | 2 refills | Status: DC

## 2016-02-15 ENCOUNTER — Other Ambulatory Visit: Payer: Self-pay | Admitting: Urgent Care

## 2016-02-15 NOTE — Telephone Encounter (Signed)
Last RF 8/18 for 1 mos. Last OV for anxiety in June.

## 2016-02-15 NOTE — Telephone Encounter (Signed)
RTC

## 2016-02-19 NOTE — Telephone Encounter (Signed)
Patient requesting xanax Virginia Romero

## 2016-02-20 ENCOUNTER — Telehealth: Payer: Self-pay

## 2016-02-20 NOTE — Telephone Encounter (Signed)
Spoke with pt about Alprazolam. There is no refills. Cannot afford to come in for visit/

## 2016-02-20 NOTE — Telephone Encounter (Signed)
Patient stated she called pharmacy to request refill of Alprazolam 1 MG and it was denied. Patient is not sure why medication was denied. I stated to the patient she may need an office visit. Please call patient to follow up.

## 2016-02-21 MED ORDER — ALPRAZOLAM 1 MG PO TABS: 1.0000 mg | ORAL_TABLET | Freq: Every day | ORAL | 0 refills | Status: DC | PRN

## 2016-02-21 NOTE — Telephone Encounter (Signed)
Faxed Rx and gave pt Mani's message.

## 2016-02-21 NOTE — Telephone Encounter (Signed)
I am not this patient's PCP, I helped her by picking up where NP Debbie left off with her worker's comp case. However, this was [employer] patient. She can try to look her up to see where she is working now. I provided the patient a courtesy refill already and cannot continue doing this especially for a high dose of Xanax that she is on of 1mg  twice daily. She has to establish care with another provider. In the meantime, in order to avoid any adverse effects or seizures I will prescribe patient 1mg  tablets to be taken once daily and will provide a 14 day supply.

## 2016-03-07 ENCOUNTER — Telehealth: Payer: Self-pay

## 2016-03-07 NOTE — Telephone Encounter (Signed)
Pt wants to know where Glenda Chroman NP will be working full time and when she will be there. I advised pt she is at [facility] part time seeing acute pts; pt said she is not sick and does not need refills she just wants to establish with Glenda Chroman NP as her pt wherever she is going to be full time. Pt request cb.

## 2016-03-08 NOTE — Telephone Encounter (Signed)
Called patient and left voice mail. She can see me at [facility] if needed prior to January when [place] should open.

## 2016-04-17 ENCOUNTER — Ambulatory Visit: Payer: Self-pay | Admitting: Family Medicine

## 2016-05-01 ENCOUNTER — Ambulatory Visit (INDEPENDENT_AMBULATORY_CARE_PROVIDER_SITE_OTHER): Payer: BLUE CROSS/BLUE SHIELD | Admitting: Family Medicine

## 2016-05-01 ENCOUNTER — Encounter: Payer: Self-pay | Admitting: Family Medicine

## 2016-05-01 VITALS — BP 180/104 | HR 60 | Temp 98.7°F | Wt 212.8 lb

## 2016-05-01 DIAGNOSIS — F32A Depression, unspecified: Secondary | ICD-10-CM

## 2016-05-01 DIAGNOSIS — F5104 Psychophysiologic insomnia: Secondary | ICD-10-CM | POA: Diagnosis not present

## 2016-05-01 DIAGNOSIS — F418 Other specified anxiety disorders: Secondary | ICD-10-CM | POA: Diagnosis not present

## 2016-05-01 DIAGNOSIS — F419 Anxiety disorder, unspecified: Principal | ICD-10-CM

## 2016-05-01 DIAGNOSIS — I10 Essential (primary) hypertension: Secondary | ICD-10-CM | POA: Diagnosis not present

## 2016-05-01 DIAGNOSIS — F329 Major depressive disorder, single episode, unspecified: Secondary | ICD-10-CM

## 2016-05-01 MED ORDER — ALPRAZOLAM 1 MG PO TABS: 1.0000 mg | ORAL_TABLET | Freq: Every evening | ORAL | 5 refills | Status: DC | PRN

## 2016-05-01 MED ORDER — HYDROCHLOROTHIAZIDE 12.5 MG PO TABS: 12.5000 mg | ORAL_TABLET | Freq: Every day | ORAL | 3 refills | Status: DC

## 2016-05-01 NOTE — Progress Notes (Signed)
Subjective:    Patient ID: Virginia Romero, female    DOB: May 13, 1956, 60 y.o.   MRN: [id number]  HPI This is a pleasant 60 yo patient known to me. She presents today for follow up of chronic problems, DM type 2, HTN, anxiety/depression, hypothyroidism. She has been out of her alprazolam and HCTZfor several weeks, has not been able to sleep due to flashbacks. Feels that elevated blood pressure due to lack of sleep as well as being out of HCTZ. Takes metoprolol BID. Her dog died several months ago and she continues to be lonely. She spent Thanksgiving with her children and her ex-husband which she enjoyed. Continues to have lifting restrictions due to left shoulder injury. Was cleared by ortho to lift maximum 10 pounds. Has looked for some part time work but is limited due to lifting restriction. Does not like to get out much, feels that she has little to offer others- very limited finances, "not friendly." Her neighbors check in on her. Food support from her grown son and daughter.   Past Medical History:  Diagnosis Date  . Anxiety   . Arthritis    KNEES AND LOWER BACK   . Cancer (HCC)    THYROID  . 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 Depression   . Diabetes mellitus    Type II  . Endometrial polyp   . GERD (gastroesophageal reflux disease)   . History of ovarian cyst   . Hyperlipemia   . Hypertension   . PTSD (post-traumatic stress disorder)   . Shortness of breath dyspnea    with anxiety  . Thyroid disease    Past Surgical History:  Procedure Laterality Date  . CESAREAN SECTION  '79 AND '86   X2  . COLONOSCOPY     with polypectomy  . DIAGNOSTIC LAPAROSCOPY  07/17/2006   WITH LYSIS OF PERITUBULAR AND PERIOVARIAN ADHESIONS, LYSIS OF OMENTAL ADHESIONS, LSO, RIGHT  OVARIAN CYSTECTOMY , HYSTEROSCOPY  WITH EXCISION OF ENDOMETRIAL POLYP...  . ORIF HUMERUS FRACTURE Left 03/01/2015   Procedure: OPEN REDUCTION INTERNAL FIXATION (ORIF) PROXIMAL HUMERUS  FRACTURE;  Surgeon: Netta Cedars, MD;  Location: [facility];  Service: Orthopedics;  Laterality: Left;  . THYROIDECTOMY  2000  . TUBAL LIGATION     Family History  Problem Relation Age of Onset  . Hypertension Mother   . Heart disease Mother   . Diabetes Brother   . Hypertension Maternal Grandmother   . Heart disease Maternal Grandmother   . Diabetes Maternal Grandfather   . Diabetes Paternal Grandfather    Social History  Substance Use Topics  . Smoking status: Current Every Day Smoker    Packs/day: 0.50    Years: 30.00    Types: Cigarettes  . Smokeless tobacco: Never Used  . Alcohol use No      Review of Systems  Respiratory: Negative for cough, chest tightness, shortness of breath and wheezing.   Cardiovascular: Positive for leg swelling (since being off HCTZ). Negative for chest pain.  Musculoskeletal: Positive for arthralgias (left shoulder).  Psychiatric/Behavioral: Positive for dysphoric mood and sleep disturbance.       Objective:   Physical Exam  Constitutional: She is oriented to person, place, and time. She appears well-developed and well-nourished. No distress.  HENT:  Head: Normocephalic and atraumatic.  Eyes: Conjunctivae are normal.  Cardiovascular: Normal rate and regular rhythm.   Pulmonary/Chest: Effort normal and breath sounds normal.  Musculoskeletal:  Decreased ROM  left shoulder.   Neurological: She is alert and oriented to person, place, and time.  Skin: Skin is warm and dry. She is not diaphoretic.  Psychiatric: She exhibits a depressed mood.  Flat affect.   Vitals reviewed.     BP (!) 192/102   Pulse 60   Temp 98.7 F (37.1 C) (Oral)   Wt 212 lb 12 oz (96.5 kg)   SpO2 98%   BMI 39.55 kg/m  Wt Readings from Last 3 Encounters:  05/01/16 212 lb 12 oz (96.5 kg)  01/06/16 215 lb (97.5 kg)  11/28/15 219 lb (99.3 kg)  Blood pressure recheck- 180/104     Assessment & Plan:  1. Anxiety and depression - chronic problems for patient which  are worsened by loss of her elderly (59 yo) dog - encouraged her to consider volunteering at animal facility, fostering a dog, continue to look for part time employment.  - ALPRAZolam (XANAX) 1 MG tablet; Take 1 tablet (1 mg total) by mouth at bedtime as needed for anxiety.  Dispense: 30 tablet; Refill: 5- discussed addictive nature and encouraged her to use as sparingly as possible - Ambulatory referral to Psychiatry  2. Essential hypertension - continue metoprolol BID - hydrochlorothiazide (HYDRODIURIL) 12.5 MG tablet; Take 1 tablet (12.5 mg total) by mouth daily.  Dispense: 90 tablet; Refill: 3  3. Psychophysiological insomnia - related to anxiety/depression, left shoulder pain - ALPRAZolam (XANAX) 1 MG tablet; Take 1 tablet (1 mg total) by mouth at bedtime as needed for anxiety.  Dispense: 30 tablet; Refill: 5  - she declined labs today, labs from 11/28/15- HgbA1C- 6.5, CMP- normal, TSH- 1.11, urine microalbumin- 1.7  - follow up in 3 months for CPE, sooner if needed Clarene Reamer, FNP-BC  [facility], [place]  05/01/2016 11:37 AM

## 2016-05-01 NOTE — Patient Instructions (Addendum)
[phone]  [email or web address]  Call [facility] [place] at the end of December to schedule for a complete physical appointment in February.

## 2016-05-01 NOTE — Progress Notes (Signed)
Pre visit review using our clinic review tool, if applicable. No additional management support is needed unless otherwise documented below in the visit note. 

## 2016-05-15 ENCOUNTER — Other Ambulatory Visit: Payer: Self-pay | Admitting: Family Medicine

## 2016-05-15 DIAGNOSIS — E1169 Type 2 diabetes mellitus with other specified complication: Secondary | ICD-10-CM

## 2016-05-15 DIAGNOSIS — E785 Hyperlipidemia, unspecified: Principal | ICD-10-CM

## 2016-05-23 NOTE — Telephone Encounter (Signed)
Pt request refill metformin; advised pt seen 05/01/2016 and will refill # 30 x 2 until pt schedules CPX in 07/2016. Pt voiced understanding.

## 2016-05-28 ENCOUNTER — Other Ambulatory Visit: Payer: Self-pay | Admitting: *Deleted

## 2016-05-28 DIAGNOSIS — E1169 Type 2 diabetes mellitus with other specified complication: Secondary | ICD-10-CM

## 2016-05-28 DIAGNOSIS — E785 Hyperlipidemia, unspecified: Principal | ICD-10-CM

## 2016-06-11 ENCOUNTER — Other Ambulatory Visit: Payer: Self-pay | Admitting: Urgent Care

## 2016-06-28 ENCOUNTER — Telehealth: Payer: Self-pay | Admitting: Family Medicine

## 2016-06-28 NOTE — Telephone Encounter (Signed)
Paperwork: [employer], [employer] received by [employer] requesting form]: Patient   Individual made aware of 3-5 business day turn around (Y/N): N/A   Office form(s) completed and placed with paperwork (Y/N):    Form location: In [employer]

## 2016-07-01 NOTE — Telephone Encounter (Signed)
Paperwork completed and given to [employer].

## 2016-07-02 ENCOUNTER — Other Ambulatory Visit: Payer: Self-pay | Admitting: Family Medicine

## 2016-07-02 ENCOUNTER — Other Ambulatory Visit: Payer: Self-pay | Admitting: Urgent Care

## 2016-07-02 MED ORDER — METOPROLOL TARTRATE 100 MG PO TABS: 100.0000 mg | ORAL_TABLET | Freq: Two times a day (BID) | ORAL | 1 refills | Status: DC

## 2016-07-02 NOTE — Telephone Encounter (Signed)
Faxed to [employer] @ [phone]. Confirmed. Sending to scan

## 2016-07-02 NOTE — Telephone Encounter (Signed)
Patient is no longer at this practice. She is being managed by NP-Gessner. I will forward this request for metoprolol refill to her.

## 2016-07-02 NOTE — Progress Notes (Signed)
Message received from [facility] that prescription refill was denied for metoprolol. Refill sent to patient's pharmacy.

## 2016-07-09 ENCOUNTER — Other Ambulatory Visit: Payer: Self-pay | Admitting: Physician Assistant

## 2016-07-10 ENCOUNTER — Encounter (HOSPITAL_COMMUNITY): Payer: Self-pay

## 2016-07-23 ENCOUNTER — Other Ambulatory Visit: Payer: Self-pay | Admitting: Physician Assistant

## 2016-07-24 ENCOUNTER — Other Ambulatory Visit: Payer: Self-pay | Admitting: Family Medicine

## 2016-07-30 ENCOUNTER — Other Ambulatory Visit: Payer: Self-pay | Admitting: Family Medicine

## 2016-07-30 ENCOUNTER — Telehealth: Payer: Self-pay | Admitting: Family Medicine

## 2016-07-30 NOTE — Telephone Encounter (Signed)
Patient is calling to request a new prescription on thyroid medicine, Levothyroxine.   She is a patient of NP Carlean Purl but has not seen her at [facility] yet.  Thank you,  -LL

## 2016-07-31 MED ORDER — LEVOTHYROXINE SODIUM 200 MCG PO TABS: ORAL_TABLET | ORAL | 2 refills | Status: DC

## 2016-07-31 NOTE — Addendum Note (Signed)
Addended by: Marian Sorrow on: 07/31/2016 08:57 AM   Modules accepted: Orders

## 2016-07-31 NOTE — Telephone Encounter (Signed)
Spoke to pt, told her Rx refill for Levothyroxine was sent to [facility] would like to see you in May for follow up. Pt verbalized understanding and will call back and schedule appt.

## 2016-08-15 ENCOUNTER — Encounter: Payer: Self-pay | Admitting: Women's Health

## 2016-08-15 ENCOUNTER — Ambulatory Visit (INDEPENDENT_AMBULATORY_CARE_PROVIDER_SITE_OTHER): Payer: Medicare Other | Admitting: Women's Health

## 2016-08-15 VITALS — BP 123/83 | Ht 65.0 in

## 2016-08-15 DIAGNOSIS — Z13820 Encounter for screening for osteoporosis: Secondary | ICD-10-CM

## 2016-08-15 DIAGNOSIS — Z01419 Encounter for gynecological examination (general) (routine) without abnormal findings: Secondary | ICD-10-CM | POA: Diagnosis not present

## 2016-08-15 NOTE — Progress Notes (Signed)
Virginia Romero Jul 30, 1955 [id number]    History:    Presents for annual exam.  Postmenopausal on no HRT with no bleeding. 04/2014 benign endometrial polyp. Normal Pap and mammogram history. 2014 T score -0.2 at hip. 02/2015 fractured right shoulder from traumatic fall on cement stairs. Age 61 negative colonoscopy. Primary care manages hypothyroid, hypertension, diabetes, anxiety and depression. Currently on disability for severe depression posttraumatic stress. Not sexually active in years.  Past medical history, past surgical history, family history and social history were all reviewed and documented in the EPIC chart. Son who lives in [place] very helpful. Has daughter in [place]. Has 2 Foster dogs. Cared for ex-husband who died of AIDS.  ROS:  A ROS was performed and pertinent positives and negatives are included.  Exam:  Vitals:   08/15/16 0830  BP: 123/83  Height: 5\' 5"  (1.651 m)   There is no height or weight on file to calculate BMI.   General appearance:  Normal Thyroid:  Symmetrical, normal in size, without palpable masses or nodularity. Respiratory  Auscultation:  Clear without wheezing or rhonchi Cardiovascular  Auscultation:  Regular rate, without rubs, murmurs or gallops  Edema/varicosities:  Not grossly evident Abdominal  Soft,nontender, without masses, guarding or rebound.  Liver/spleen:  No organomegaly noted  Hernia:  None appreciated  Skin  Inspection:  Grossly normal   Breasts: Examined lying and sitting.     Right: Without masses, retractions, discharge or axillary adenopathy.     Left: Without masses, retractions, discharge or axillary adenopathy. Gentitourinary   Inguinal/mons:  Normal without inguinal adenopathy  External genitalia:  Normal  BUS/Urethra/Skene's glands:  Normal  Vagina:  Normal  Cervix:  Normal  Uterus: normal in size, shape and contour.  Midline and mobile  Adnexa/parametria:     Rt: Without masses or tenderness.   Lt: Without  masses or tenderness.  Anus and perineum: Normal  Digital rectal exam: Normal sphincter tone without palpated masses or tenderness  Assessment/Plan:  61 y.o. DWF G2P2 for annual exam.    Postmenopausal/no HRT/no bleeding Hypothyroid, hypertension, diabetes, anxiety/depression-primary care manages labs and meds  Plan: Repeat screening colonoscopy will call to schedule. Zostavax vaccine recommended prescription given will check with insurance reports is on a limited income. SBE's, continue annual screening mammogram reports has done at [facility] will continue annual screening. Reviewed importance of regular exercise, calcium rich diet, vitamin D 2000 daily. Pap normal 2016, new screening guidelines reviewed. Reviewed importance of condoms if become sexually active. Home safety, fall prevention and importance of weightbearing exercise reviewed.Marland Kitchen    [place], 9:02 AM 08/15/2016

## 2016-08-15 NOTE — Patient Instructions (Signed)
Health Maintenance for Postmenopausal Women Menopause is a normal process in which your reproductive ability comes to an end. This process happens gradually over a span of months to years, usually between the ages of 33 and 38. Menopause is complete when you have missed 12 consecutive menstrual periods. It is important to talk with your health care provider about some of the most common conditions that affect postmenopausal women, such as heart disease, cancer, and bone loss (osteoporosis). Adopting a healthy lifestyle and getting preventive care can help to promote your health and wellness. Those actions can also lower your chances of developing some of these common conditions. What should I know about menopause? During menopause, you may experience a number of symptoms, such as:  Moderate-to-severe hot flashes.  Night sweats.  Decrease in sex drive.  Mood swings.  Headaches.  Tiredness.  Irritability.  Memory problems.  Insomnia. Choosing to treat or not to treat menopausal changes is an individual decision that you make with your health care provider. What should I know about hormone replacement therapy and supplements? Hormone therapy products are effective for treating symptoms that are associated with menopause, such as hot flashes and night sweats. Hormone replacement carries certain risks, especially as you become older. If you are thinking about using estrogen or estrogen with progestin treatments, discuss the benefits and risks with your health care provider. What should I know about heart disease and stroke? Heart disease, heart attack, and stroke become more likely as you age. This may be due, in part, to the hormonal changes that your body experiences during menopause. These can affect how your body processes dietary fats, triglycerides, and cholesterol. Heart attack and stroke are both medical emergencies. There are many things that you can do to help prevent heart disease  and stroke:  Have your blood pressure checked at least every 1-2 years. High blood pressure causes heart disease and increases the risk of stroke.  If you are 48-61 years old, ask your health care provider if you should take aspirin to prevent a heart attack or a stroke.  Do not use any tobacco products, including cigarettes, chewing tobacco, or electronic cigarettes. If you need help quitting, ask your health care provider.  It is important to eat a healthy diet and maintain a healthy weight.  Be sure to include plenty of vegetables, fruits, low-fat dairy products, and lean protein.  Avoid eating foods that are high in solid fats, added sugars, or salt (sodium).  Get regular exercise. This is one of the most important things that you can do for your health.  Try to exercise for at least 150 minutes each week. The type of exercise that you do should increase your heart rate and make you sweat. This is known as moderate-intensity exercise.  Try to do strengthening exercises at least twice each week. Do these in addition to the moderate-intensity exercise.  Know your numbers.Ask your health care provider to check your cholesterol and your blood glucose. Continue to have your blood tested as directed by your health care provider. What should I know about cancer screening? There are several types of cancer. Take the following steps to reduce your risk and to catch any cancer development as early as possible. Breast Cancer  Practice breast self-awareness.  This means understanding how your breasts normally appear and feel.  It also means doing regular breast self-exams. Let your health care provider know about any changes, no matter how small.  If you are 40 or older,  have a clinician do a breast exam (clinical breast exam or CBE) every year. Depending on your age, family history, and medical history, it may be recommended that you also have a yearly breast X-ray (mammogram).  If you  have a family history of breast cancer, talk with your health care provider about genetic screening.  If you are at high risk for breast cancer, talk with your health care provider about having an MRI and a mammogram every year.  Breast cancer (BRCA) gene test is recommended for women who have family members with BRCA-related cancers. Results of the assessment will determine the need for genetic counseling and BRCA1 and for BRCA2 testing. BRCA-related cancers include these types:  Breast. This occurs in males or females.  Ovarian.  Tubal. This may also be called fallopian tube cancer.  Cancer of the abdominal or pelvic lining (peritoneal cancer).  Prostate.  Pancreatic. Cervical, Uterine, and Ovarian Cancer  Your health care provider may recommend that you be screened regularly for cancer of the pelvic organs. These include your ovaries, uterus, and vagina. This screening involves a pelvic exam, which includes checking for microscopic changes to the surface of your cervix (Pap test).  For women ages 21-65, health care providers may recommend a pelvic exam and a Pap test every three years. For women ages 23-65, they may recommend the Pap test and pelvic exam, combined with testing for human papilloma virus (HPV), every five years. Some types of HPV increase your risk of cervical cancer. Testing for HPV may also be done on women of any age who have unclear Pap test results.  Other health care providers may not recommend any screening for nonpregnant women who are considered low risk for pelvic cancer and have no symptoms. Ask your health care provider if a screening pelvic exam is right for you.  If you have had past treatment for cervical cancer or a condition that could lead to cancer, you need Pap tests and screening for cancer for at least 20 years after your treatment. If Pap tests have been discontinued for you, your risk factors (such as having a new sexual partner) need to be reassessed  to determine if you should start having screenings again. Some women have medical problems that increase the chance of getting cervical cancer. In these cases, your health care provider may recommend that you have screening and Pap tests more often.  If you have a family history of uterine cancer or ovarian cancer, talk with your health care provider about genetic screening.  If you have vaginal bleeding after reaching menopause, tell your health care provider.  There are currently no reliable tests available to screen for ovarian cancer. Lung Cancer  Lung cancer screening is recommended for adults 99-83 years old who are at high risk for lung cancer because of a history of smoking. A yearly low-dose CT scan of the lungs is recommended if you:  Currently smoke.  Have a history of at least 30 pack-years of smoking and you currently smoke or have quit within the past 15 years. A pack-year is smoking an average of one pack of cigarettes per day for one year. Yearly screening should:  Continue until it has been 15 years since you quit.  Stop if you develop a health problem that would prevent you from having lung cancer treatment. Colorectal Cancer  This type of cancer can be detected and can often be prevented.  Routine colorectal cancer screening usually begins at age 72 and continues  through age 75.  If you have risk factors for colon cancer, your health care provider may recommend that you be screened at an earlier age.  If you have a family history of colorectal cancer, talk with your health care provider about genetic screening.  Your health care provider may also recommend using home test kits to check for hidden blood in your stool.  A small camera at the end of a tube can be used to examine your colon directly (sigmoidoscopy or colonoscopy). This is done to check for the earliest forms of colorectal cancer.  Direct examination of the colon should be repeated every 5-10 years until  age 75. However, if early forms of precancerous polyps or small growths are found or if you have a family history or genetic risk for colorectal cancer, you may need to be screened more often. Skin Cancer  Check your skin from head to toe regularly.  Monitor any moles. Be sure to tell your health care provider:  About any new moles or changes in moles, especially if there is a change in a mole's shape or color.  If you have a mole that is larger than the size of a pencil eraser.  If any of your family members has a history of skin cancer, especially at a young age, talk with your health care provider about genetic screening.  Always use sunscreen. Apply sunscreen liberally and repeatedly throughout the day.  Whenever you are outside, protect yourself by wearing long sleeves, pants, a wide-brimmed hat, and sunglasses. What should I know about osteoporosis? Osteoporosis is a condition in which bone destruction happens more quickly than new bone creation. After menopause, you may be at an increased risk for osteoporosis. To help prevent osteoporosis or the bone fractures that can happen because of osteoporosis, the following is recommended:  If you are 19-50 years old, get at least 1,000 mg of calcium and at least 600 mg of vitamin D per day.  If you are older than age 50 but younger than age 70, get at least 1,200 mg of calcium and at least 600 mg of vitamin D per day.  If you are older than age 70, get at least 1,200 mg of calcium and at least 800 mg of vitamin D per day. Smoking and excessive alcohol intake increase the risk of osteoporosis. Eat foods that are rich in calcium and vitamin D, and do weight-bearing exercises several times each week as directed by your health care provider. What should I know about how menopause affects my mental health? Depression may occur at any age, but it is more common as you become older. Common symptoms of depression include:  Low or sad  mood.  Changes in sleep patterns.  Changes in appetite or eating patterns.  Feeling an overall lack of motivation or enjoyment of activities that you previously enjoyed.  Frequent crying spells. Talk with your health care provider if you think that you are experiencing depression. What should I know about immunizations? It is important that you get and maintain your immunizations. These include:  Tetanus, diphtheria, and pertussis (Tdap) booster vaccine.  Influenza every year before the flu season begins.  Pneumonia vaccine.  Shingles vaccine. Your health care provider may also recommend other immunizations. This information is not intended to replace advice given to you by your health care provider. Make sure you discuss any questions you have with your health care provider. Document Released: 07/12/2005 Document Revised: 12/08/2015 Document Reviewed: 02/21/2015 Elsevier Interactive Patient   Education  2017 [employer]  

## 2016-08-22 ENCOUNTER — Other Ambulatory Visit: Payer: Self-pay | Admitting: Gynecology

## 2016-08-22 DIAGNOSIS — Z13820 Encounter for screening for osteoporosis: Secondary | ICD-10-CM

## 2016-08-28 ENCOUNTER — Ambulatory Visit (HOSPITAL_COMMUNITY): Payer: BLUE CROSS/BLUE SHIELD | Admitting: Psychology

## 2016-08-29 DIAGNOSIS — H52202 Unspecified astigmatism, left eye: Secondary | ICD-10-CM | POA: Diagnosis not present

## 2016-08-29 DIAGNOSIS — H2513 Age-related nuclear cataract, bilateral: Secondary | ICD-10-CM | POA: Diagnosis not present

## 2016-08-29 DIAGNOSIS — E119 Type 2 diabetes mellitus without complications: Secondary | ICD-10-CM | POA: Diagnosis not present

## 2016-08-29 LAB — HM DIABETES EYE EXAM

## 2016-09-01 DIAGNOSIS — M8580 Other specified disorders of bone density and structure, unspecified site: Secondary | ICD-10-CM

## 2016-09-01 HISTORY — DX: Other specified disorders of bone density and structure, unspecified site: M85.80

## 2016-09-05 ENCOUNTER — Encounter: Payer: Self-pay | Admitting: Gynecology

## 2016-09-05 ENCOUNTER — Ambulatory Visit (INDEPENDENT_AMBULATORY_CARE_PROVIDER_SITE_OTHER): Payer: Medicare Other

## 2016-09-05 ENCOUNTER — Other Ambulatory Visit: Payer: Self-pay | Admitting: Gynecology

## 2016-09-05 DIAGNOSIS — M8588 Other specified disorders of bone density and structure, other site: Secondary | ICD-10-CM

## 2016-09-05 DIAGNOSIS — Z780 Asymptomatic menopausal state: Secondary | ICD-10-CM

## 2016-09-05 DIAGNOSIS — Z13820 Encounter for screening for osteoporosis: Secondary | ICD-10-CM

## 2016-09-11 ENCOUNTER — Ambulatory Visit (HOSPITAL_COMMUNITY): Payer: BLUE CROSS/BLUE SHIELD | Admitting: Psychology

## 2016-09-12 ENCOUNTER — Encounter: Payer: Self-pay | Admitting: Family Medicine

## 2016-09-19 ENCOUNTER — Telehealth: Payer: Self-pay | Admitting: Family Medicine

## 2016-09-19 NOTE — Telephone Encounter (Signed)
Resched appt on May 28th

## 2016-10-01 ENCOUNTER — Ambulatory Visit (INDEPENDENT_AMBULATORY_CARE_PROVIDER_SITE_OTHER): Payer: Medicare Other | Admitting: Psychology

## 2016-10-01 ENCOUNTER — Encounter (HOSPITAL_COMMUNITY): Payer: Self-pay | Admitting: Psychology

## 2016-10-01 ENCOUNTER — Encounter (INDEPENDENT_AMBULATORY_CARE_PROVIDER_SITE_OTHER): Payer: Self-pay

## 2016-10-01 DIAGNOSIS — F4310 Post-traumatic stress disorder, unspecified: Secondary | ICD-10-CM

## 2016-10-01 DIAGNOSIS — F331 Major depressive disorder, recurrent, moderate: Secondary | ICD-10-CM | POA: Diagnosis not present
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
## 2016-10-02 ENCOUNTER — Other Ambulatory Visit: Payer: Self-pay | Admitting: Family Medicine

## 2016-10-02 DIAGNOSIS — E785 Hyperlipidemia, unspecified: Principal | ICD-10-CM

## 2016-10-02 DIAGNOSIS — E1169 Type 2 diabetes mellitus with other specified complication: Secondary | ICD-10-CM

## 2016-10-03 NOTE — Telephone Encounter (Signed)
Patient tried to call and check on status of refill, can you advise?

## 2016-10-28 ENCOUNTER — Ambulatory Visit: Payer: BLUE CROSS/BLUE SHIELD | Admitting: Family Medicine

## 2016-10-30 ENCOUNTER — Ambulatory Visit (INDEPENDENT_AMBULATORY_CARE_PROVIDER_SITE_OTHER): Payer: Medicare Other | Admitting: Family Medicine

## 2016-10-30 ENCOUNTER — Encounter: Payer: Self-pay | Admitting: Family Medicine

## 2016-10-30 VITALS — BP 146/80 | HR 67 | Temp 98.1°F | Wt 212.4 lb

## 2016-10-30 DIAGNOSIS — Z604 Social exclusion and rejection: Secondary | ICD-10-CM

## 2016-10-30 DIAGNOSIS — F5104 Psychophysiologic insomnia: Secondary | ICD-10-CM | POA: Diagnosis not present

## 2016-10-30 DIAGNOSIS — F32A Depression, unspecified: Secondary | ICD-10-CM

## 2016-10-30 DIAGNOSIS — E785 Hyperlipidemia, unspecified: Secondary | ICD-10-CM | POA: Diagnosis not present

## 2016-10-30 DIAGNOSIS — I10 Essential (primary) hypertension: Secondary | ICD-10-CM | POA: Diagnosis not present

## 2016-10-30 DIAGNOSIS — F329 Major depressive disorder, single episode, unspecified: Secondary | ICD-10-CM

## 2016-10-30 DIAGNOSIS — G8929 Other chronic pain: Secondary | ICD-10-CM

## 2016-10-30 DIAGNOSIS — Z598 Other problems related to housing and economic circumstances: Secondary | ICD-10-CM

## 2016-10-30 DIAGNOSIS — E1169 Type 2 diabetes mellitus with other specified complication: Secondary | ICD-10-CM

## 2016-10-30 DIAGNOSIS — Z5987 Material hardship: Secondary | ICD-10-CM

## 2016-10-30 DIAGNOSIS — E034 Atrophy of thyroid (acquired): Secondary | ICD-10-CM

## 2016-10-30 DIAGNOSIS — M25512 Pain in left shoulder: Secondary | ICD-10-CM

## 2016-10-30 DIAGNOSIS — F419 Anxiety disorder, unspecified: Secondary | ICD-10-CM

## 2016-10-30 LAB — CBC
HCT: 38.7 % (ref 36.0–46.0)
HEMOGLOBIN: 12.8 g/dL (ref 12.0–15.0)
MCHC: 33 g/dL (ref 30.0–36.0)
MCV: 88.1 fl (ref 78.0–100.0)
PLATELETS: 360 10*3/uL (ref 150.0–400.0)
RBC: 4.4 Mil/uL (ref 3.87–5.11)
RDW: 14.9 % (ref 11.5–15.5)
WBC: 9.1 10*3/uL (ref 4.0–10.5)

## 2016-10-30 LAB — COMPREHENSIVE METABOLIC PANEL
ALBUMIN: 4.3 g/dL (ref 3.5–5.2)
ALT: 12 U/L (ref 0–35)
AST: 14 U/L (ref 0–37)
Alkaline Phosphatase: 111 U/L (ref 39–117)
BILIRUBIN TOTAL: 0.2 mg/dL (ref 0.2–1.2)
BUN: 9 mg/dL (ref 6–23)
CALCIUM: 9.3 mg/dL (ref 8.4–10.5)
CHLORIDE: 104 meq/L (ref 96–112)
CO2: 30 mEq/L (ref 19–32)
CREATININE: 0.95 mg/dL (ref 0.40–1.20)
GFR: 63.73 mL/min (ref >60.00)
Glucose, Bld: 107 mg/dL — ABNORMAL HIGH (ref 70–99)
Potassium: 3.7 mEq/L (ref 3.5–5.1)
Sodium: 139 mEq/L (ref 135–145)
Total Protein: 7.1 g/dL (ref 6.0–8.3)

## 2016-10-30 LAB — HEMOGLOBIN A1C: HEMOGLOBIN A1C: 6.9 % — AB (ref 4.6–6.5)

## 2016-10-30 MED ORDER — MELOXICAM 7.5 MG PO TABS: 7.5000 mg | ORAL_TABLET | Freq: Every day | ORAL | 3 refills | Status: DC

## 2016-10-30 MED ORDER — METFORMIN HCL 500 MG PO TABS: 500.0000 mg | ORAL_TABLET | Freq: Two times a day (BID) | ORAL | 5 refills | Status: DC

## 2016-10-30 MED ORDER — SERTRALINE HCL 100 MG PO TABS: 100.0000 mg | ORAL_TABLET | Freq: Every day | ORAL | 5 refills | Status: DC

## 2016-10-30 MED ORDER — ALPRAZOLAM 1 MG PO TABS: 1.0000 mg | ORAL_TABLET | Freq: Every evening | ORAL | 5 refills | Status: DC | PRN

## 2016-10-30 MED ORDER — LEVOTHYROXINE SODIUM 200 MCG PO TABS: ORAL_TABLET | ORAL | 5 refills | Status: DC

## 2016-10-30 MED ORDER — PRAVASTATIN SODIUM 40 MG PO TABS: 40.0000 mg | ORAL_TABLET | Freq: Every day | ORAL | 5 refills | Status: DC

## 2016-10-30 MED ORDER — METHOCARBAMOL 500 MG PO TABS: 500.0000 mg | ORAL_TABLET | Freq: Three times a day (TID) | ORAL | 5 refills | Status: DC | PRN

## 2016-10-30 MED ORDER — HYDROCHLOROTHIAZIDE 12.5 MG PO TABS: 12.5000 mg | ORAL_TABLET | Freq: Every day | ORAL | 3 refills | Status: DC

## 2016-10-30 MED ORDER — METOPROLOL TARTRATE 100 MG PO TABS: 100.0000 mg | ORAL_TABLET | Freq: Two times a day (BID) | ORAL | 5 refills | Status: DC
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
## 2016-10-30 NOTE — Patient Instructions (Signed)
It was so good to see you today!  [facility] outpatient pharmacy phone number- [phone]  [place]. [phone]  Please schedule your Welcome To Medicare Visit

## 2016-10-31 ENCOUNTER — Ambulatory Visit (INDEPENDENT_AMBULATORY_CARE_PROVIDER_SITE_OTHER): Payer: Medicare Other | Admitting: Psychology

## 2016-10-31 ENCOUNTER — Encounter: Payer: Self-pay | Admitting: Family Medicine

## 2016-10-31 DIAGNOSIS — F331 Major depressive disorder, recurrent, moderate: Secondary | ICD-10-CM

## 2016-10-31 NOTE — Progress Notes (Signed)
Called and left voicemail for pt to return call to office. In reference to resources available for medication assistance.

## 2016-10-31 NOTE — Progress Notes (Signed)
Spoke with patient she states that [facility] had all her prescriptions for 4 dollars and the Xanax is 11 and she is able to afford them at that price. Nothing further needed at this time

## 2016-10-31 NOTE — Progress Notes (Signed)
   THERAPIST PROGRESS NOTE  Session Time: 2.30pm-3.15pm  Participation Level: Active  Behavioral Response: Well GroomedAlertDepressed  Type of Therapy: Individual Therapy  Treatment Goals addressed: Diagnosis: MDd and goal 1  Interventions: CBT and Supportive  Summary: Virginia Romero is a 61 y.o. female who presents with affect congruent w/ report of depressed.  Pt reported she did see her PCP yesterday for f/u.  Pt reported that she did refer to community resources to assist w/ cheaper medications.  Pt reported that she continues to have depressed mood, tearfulness and feeling lonely. Pt reported that she has felt heart about not hearing back from her daughter after attempting to reach her 7 times over the holiday weekend.  Pt discussed how at times internalizes and makes her wonder "what I did wrong".  Pt is able to challenge and reframe and acknowledge can't make her call back and daughter is busy w/ other things.  Pt discussed nice visit from her son on mother's day and how continues to feel close to him and accept him.  Pt discussed how family and other's question his sexual identity and at times she has wondered but accepts either way. Her concern is for him to be happy. Pt discusses nonjudging attitude that is part of her.  Suicidal/Homicidal: Nowithout intent/plan  Therapist Response: Assessed pt current functioning per pt report.  Explored w/ pt interactions w/ family.  Discussed pt hurt by lack of contact w/ daughter and coping w/out internalizing.  Explored positive interactions and valuing son w/out concern of identity.    Plan: Return again in 2 weeks.  Diagnosis: MDD, recurrent, moderate    YATES,LEANNE, LPC 10/31/2016

## 2016-10-31 NOTE — Progress Notes (Unsigned)
-----   Message ----- From: Roger Shelter, NT Sent: 10/31/2016 10:02 AM To: Precious Gilding, RN Subject: RE: Referral from MD               Good Morning,   Yes, try needymeds.com and also google patients medication to see if the company has a pharmacy assist program. I hope this helps.  Have a good day,  Tamika Gravely   ----- Message ----- From: Precious Gilding, RN Sent: 10/31/2016  8:16 AM To: Roger Shelter, NT, Elby Beck, FNP Subject: FW: Referral from MD               Tamika,   Happy Thursday. I know [facility] is active in community resources and being a link for all patients. Can you provide a few resources we may provide to the patient to assist in a financial program to purchase medications?  Thank you for making [place] a priority.   Best, Lucina Mellow, RN     ----- Message ----- From: Elby Beck, FNP Sent: 10/30/2016  4:03 PM To: Precious Gilding, RN Subject: FW: Referral from MD               Any suggestions for resources to help her get her meds?  Thanks,  Debbie  ----- Message ----- From: Roger Shelter, NT Sent: 10/30/2016  3:39 PM To: Elby Beck, FNP Subject: Referral from MD                 Good Afternoon,  Thank you for this referral.  Unfortunately our Care Management Team will not be able to assist you with this patient.  The patient is not on the current member enrollment roster for any of the [facility] risk contracted plans. The payer plans send [place] this roster regularly and we have checked the list for this member.   Please call us if you need further clarification at [phone].   Thank you,  [facility] Care Management Assistant

## 2016-12-18 ENCOUNTER — Ambulatory Visit (INDEPENDENT_AMBULATORY_CARE_PROVIDER_SITE_OTHER): Payer: Medicare Other | Admitting: Psychology

## 2016-12-18 DIAGNOSIS — F331 Major depressive disorder, recurrent, moderate: Secondary | ICD-10-CM | POA: Diagnosis not present
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
## 2016-12-19 ENCOUNTER — Telehealth: Payer: Self-pay | Admitting: Family Medicine

## 2016-12-19 NOTE — Telephone Encounter (Signed)
Please advise  Called and spoke with patient she states that she has 2 pills left and would like to know if you can increase her daily intake to 2 a day.

## 2016-12-19 NOTE — Telephone Encounter (Signed)
Patient called to advise that she is supposed to take ALPRAZolam (XANAX) 1 MG tablet 1 time a day at bedtime however, has been taking 2 a day due to her BP going up to 180. Patient is requesting an increase in dosage to 2x a day.   **Remind patient they can make refill requests via MyChart**  Medication refill request (Name & Dosage):  ALPRAZolam (XANAX) 1 MG tablet 2x a day  Preferred pharmacy (Name & Address):  [facility] [place], [address]   Other comments (if applicable):  Call patient to advise if necessary

## 2016-12-20 ENCOUNTER — Other Ambulatory Visit: Payer: Self-pay | Admitting: Family Medicine

## 2016-12-20 DIAGNOSIS — F329 Major depressive disorder, single episode, unspecified: Secondary | ICD-10-CM

## 2016-12-20 DIAGNOSIS — F5104 Psychophysiologic insomnia: Secondary | ICD-10-CM

## 2016-12-20 DIAGNOSIS — F419 Anxiety disorder, unspecified: Principal | ICD-10-CM

## 2016-12-20 DIAGNOSIS — F32A Depression, unspecified: Secondary | ICD-10-CM

## 2016-12-20 MED ORDER — ALPRAZOLAM 1 MG PO TABS: 1.0000 mg | ORAL_TABLET | Freq: Two times a day (BID) | ORAL | 0 refills | Status: DC | PRN

## 2016-12-20 NOTE — Telephone Encounter (Signed)
Pt returned call and was advised rx was called in and to come in for OV within the next 1-2 weeks if BP is running high. She is scheduled for 01/10/17 but will contact the office if BP is running high between now and then.

## 2016-12-20 NOTE — Telephone Encounter (Signed)
Please call patient and tell her that I have approved a one month prescription for twice a day. If her blood pressure is running high, she needs to make an appointment in the next 1-2 weeks.

## 2016-12-20 NOTE — Telephone Encounter (Signed)
Rx called into [facility]. Called pt and left VM to call the office.

## 2017-01-06 ENCOUNTER — Encounter: Payer: Self-pay | Admitting: Family Medicine

## 2017-01-10 ENCOUNTER — Encounter: Payer: Medicare Other | Admitting: Family Medicine

## 2017-01-20 ENCOUNTER — Ambulatory Visit (INDEPENDENT_AMBULATORY_CARE_PROVIDER_SITE_OTHER): Payer: Medicare Other | Admitting: Family Medicine

## 2017-01-20 ENCOUNTER — Encounter: Payer: Self-pay | Admitting: Family Medicine

## 2017-01-20 VITALS — BP 164/98 | HR 67 | Temp 98.3°F | Ht 61.5 in | Wt 210.8 lb

## 2017-01-20 DIAGNOSIS — E034 Atrophy of thyroid (acquired): Secondary | ICD-10-CM

## 2017-01-20 DIAGNOSIS — Z1159 Encounter for screening for other viral diseases: Secondary | ICD-10-CM | POA: Diagnosis not present

## 2017-01-20 DIAGNOSIS — E785 Hyperlipidemia, unspecified: Secondary | ICD-10-CM | POA: Diagnosis not present

## 2017-01-20 DIAGNOSIS — Z0000 Encounter for general adult medical examination without abnormal findings: Secondary | ICD-10-CM

## 2017-01-20 DIAGNOSIS — F5104 Psychophysiologic insomnia: Secondary | ICD-10-CM | POA: Diagnosis not present

## 2017-01-20 DIAGNOSIS — E1169 Type 2 diabetes mellitus with other specified complication: Secondary | ICD-10-CM

## 2017-01-20 DIAGNOSIS — Z114 Encounter for screening for human immunodeficiency virus [HIV]: Secondary | ICD-10-CM

## 2017-01-20 DIAGNOSIS — Z1211 Encounter for screening for malignant neoplasm of colon: Secondary | ICD-10-CM

## 2017-01-20 DIAGNOSIS — F419 Anxiety disorder, unspecified: Secondary | ICD-10-CM

## 2017-01-20 DIAGNOSIS — E559 Vitamin D deficiency, unspecified: Secondary | ICD-10-CM | POA: Diagnosis not present

## 2017-01-20 DIAGNOSIS — F329 Major depressive disorder, single episode, unspecified: Secondary | ICD-10-CM | POA: Diagnosis not present

## 2017-01-20 DIAGNOSIS — F32A Depression, unspecified: Secondary | ICD-10-CM

## 2017-01-20 DIAGNOSIS — I10 Essential (primary) hypertension: Secondary | ICD-10-CM | POA: Diagnosis not present

## 2017-01-20 MED ORDER — ALPRAZOLAM 1 MG PO TABS: 1.0000 mg | ORAL_TABLET | Freq: Two times a day (BID) | ORAL | 2 refills | Status: DC | PRN

## 2017-01-20 MED ORDER — BENAZEPRIL HCL 5 MG PO TABS: 5.0000 mg | ORAL_TABLET | Freq: Every day | ORAL | 5 refills | Status: DC

## 2017-01-20 NOTE — Progress Notes (Signed)
Subjective:   Virginia Romero is a 61 y.o. female who presents for an Initial Medicare Annual Wellness Visit. She reports she is doing well. Her dogs continue to be a source of pleasure for her. She has been going to counselor to deal with some of her "issues." She has been going to church more.   Review of Systems     Review of Systems  Constitutional: Negative for weight loss.  Eyes: Negative.   Respiratory: Positive for cough (occasional, dry).   Cardiovascular: Negative.   Gastrointestinal: Negative.   Musculoskeletal:       Left shoulder pain occasionally, has noticed slow improved ROM. Bilateral knee pain worse with damp weather.   Skin: Negative.   Neurological: Negative for weakness.  Psychiatric/Behavioral: Positive for depression. Negative for suicidal ideas. The patient is nervous/anxious.           Objective:    Today's Vitals   01/20/17 1326  BP: (!) 178/98  Pulse: 67  Temp: 98.3 F (36.8 C)  TempSrc: Oral  SpO2: 96%  Weight: 210 lb 12.8 oz (95.6 kg)  Height: 5' 1.5" (1.562 m)  Repeat BP- 164/98 Body mass index is 39.19 kg/m.  Wt Readings from Last 3 Encounters:  01/20/17 210 lb 12.8 oz (95.6 kg)  10/30/16 212 lb 6.4 oz (96.3 kg)  05/01/16 212 lb 12 oz (96.5 kg)    Current Medications (verified) Current Outpatient Prescriptions on File Prior to Visit  Medication Sig Dispense Refill  . hydrochlorothiazide (HYDRODIURIL) 12.5 MG tablet Take 1 tablet (12.5 mg total) by mouth daily. 90 tablet 3  . levothyroxine (SYNTHROID, LEVOTHROID) 200 MCG tablet TAKE 1 TABLET BY MOUTH ONCE DAILY BEFORE BREAKFAST 30 tablet 5  . metFORMIN (GLUCOPHAGE) 500 MG tablet Take 1 tablet (500 mg total) by mouth 2 (two) times daily with a meal. 60 tablet 5  . metoprolol tartrate (LOPRESSOR) 100 MG tablet Take 1 tablet (100 mg total) by mouth 2 (two) times daily. 60 tablet 5  . sertraline (ZOLOFT) 100 MG tablet Take 1 tablet (100 mg total) by mouth daily. 30 tablet 5  .  pravastatin (PRAVACHOL) 40 MG tablet Take 1 tablet (40 mg total) by mouth daily. (Patient not taking: Reported on 01/20/2017) 30 tablet 5  . [DISCONTINUED] Metoprolol Succinate (TOPROL XL PO) Take by mouth.     No current facility-administered medications on file prior to visit.      Allergies (verified) Patient has no known allergies.   History: Past Medical History:  Diagnosis Date  . Anxiety   . Arthritis    KNEES AND LOWER BACK   . Cancer (HCC)    THYROID  . 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 Depression   . Diabetes mellitus    Type II  . Endometrial polyp   . GERD (gastroesophageal reflux disease)   . History of ovarian cyst   . Hyperlipemia   . Hypertension   . Osteopenia 09/2016   T score -1.5  . PTSD (post-traumatic stress disorder)   . Shortness of breath dyspnea    with anxiety  . Thyroid disease    Past Surgical History:  Procedure Laterality Date  . CESAREAN SECTION  '79 AND '86   X2  . COLONOSCOPY     with polypectomy  . DIAGNOSTIC LAPAROSCOPY  07/17/2006   WITH LYSIS OF PERITUBULAR AND PERIOVARIAN ADHESIONS, LYSIS OF OMENTAL ADHESIONS, LSO, RIGHT  OVARIAN CYSTECTOMY , HYSTEROSCOPY  WITH  EXCISION OF ENDOMETRIAL POLYP...  . ORIF HUMERUS FRACTURE Left 03/01/2015   Procedure: OPEN REDUCTION INTERNAL FIXATION (ORIF) PROXIMAL HUMERUS FRACTURE;  Surgeon: Netta Cedars, MD;  Location: [facility];  Service: Orthopedics;  Laterality: Left;  . THYROIDECTOMY  2000  . TUBAL LIGATION     Family History  Problem Relation Age of Onset  . Hypertension Mother   . Heart disease Mother   . Diabetes Brother   . Hypertension Maternal Grandmother   . Heart disease Maternal Grandmother   . Diabetes Maternal Grandfather   . Depression Daughter   . Diabetes Paternal Grandfather    Social History   Occupational History  . Not on file.   Social History Main Topics  . Smoking status: Current Every Day Smoker    Packs/day: 0.50    Years:  30.00    Types: Cigarettes  . Smokeless tobacco: Never Used  . Alcohol use No  . Drug use: No  . Sexual activity: No     Comment: 1 partner in last year    Tobacco Counseling Ready to quit: Not Answered Counseling given: Not Answered   Activities of Daily Living No flowsheet data found.  Immunizations and Health Maintenance Immunization History  Administered Date(s) Administered  . Pneumococcal Conjugate-13 02/16/2014  . Tdap 06/03/2006   Health Maintenance Due  Topic Date Due  . Hepatitis C Screening  1955-12-04  . PNEUMOCOCCAL POLYSACCHARIDE VACCINE (1) 08/11/1958  . HIV Screening  08/11/1971  . COLONOSCOPY  08/11/2006  . PAP SMEAR  07/08/2015  . MAMMOGRAM  08/03/2015  . TETANUS/TDAP  06/03/2016  . URINE MICROALBUMIN  11/27/2016  . INFLUENZA VACCINE  01/01/2017    Patient Care Team: Elby Beck, FNP as PCP - General (Nurse Practitioner) Huel Cote, NP as Nurse Practitioner (Obstetrics and Gynecology)  Indicate any recent Medical Services you may have received from other than [facility] providers in the past year (date may be approximate). None    Assessment:   This is a routine wellness examination for Virginia Romero.   Hearing/Vision screen Hearing- gross hearing intact Vision- right 20/20, left 20/20, both 20/20  Dietary issues and exercise activities discussed:  She has been decreasing unhealthy food choices and trying to increase fruits/vegetables/lean proteins  Goals    Decreased processed food, increase vegetables, fruits, lean proteins     Depression Screen PHQ 2/9 Scores 01/20/2017 01/06/2016 11/28/2015 06/27/2015 04/26/2015 02/23/2015 01/24/2015  PHQ - 2 Score 2 4 6 6 2 5 6   PHQ- 9 Score 10 17 12 23  - 15 22      Cognitive Function:  Denies any recent changes in memory, ability to drive/navigate      Screening Tests Health Maintenance  Topic Date Due  . Hepatitis C Screening  Sep 04, 1955  . PNEUMOCOCCAL POLYSACCHARIDE VACCINE (1) 08/11/1958  .  HIV Screening  08/11/1971  . COLONOSCOPY  08/11/2006  . PAP SMEAR  07/08/2015  . MAMMOGRAM  08/03/2015  . TETANUS/TDAP  06/03/2016  . FOOT EXAM  07/25/2016  . URINE MICROALBUMIN  11/27/2016  . INFLUENZA VACCINE  01/01/2017  . HEMOGLOBIN A1C  05/02/2017  . OPHTHALMOLOGY EXAM  08/29/2017      Plan:    I have personally reviewed and noted the following in the patient's chart:   . Medical and social history . Use of alcohol, tobacco or illicit drugs  . Current medications and supplements . Functional ability and status . Nutritional status . Physical activity . Advanced directives . List of other physicians .  Hospitalizations, surgeries, and ER visits in previous 12 months . Vitals . Screenings to include cognitive, depression, and falls . Referrals and appointments  In addition, I have reviewed and discussed with patient certain preventive protocols, quality metrics, and best practice recommendations. A written personalized care plan for preventive services as well as general preventive health recommendations were provided to patient.     Elby Beck, FNP   01/20/2017     1. Medicare annual wellness visit, initial  2. Essential hypertension - likely component of anxiety to elevated BP, patient has not taken alprazolam in several days and has anxiety related to diving - benazepril (LOTENSIN) 5 MG tablet; Take 1 tablet (5 mg total) by mouth daily.  Dispense: 30 tablet; Refill: 5 - CBC - Basic metabolic panel  3. DM type 2 with diabetic dyslipidemia (HCC) - CBC - Basic metabolic panel - VITAMIN D 25 Hydroxy (Vit-D Deficiency, Fractures) - Microalbumin / creatinine urine ratio  4. Anxiety and depression - doing better, continue therapy - ALPRAZolam (XANAX) 1 MG tablet; Take 1 tablet (1 mg total) by mouth 2 (two) times daily as needed for anxiety.  Dispense: 60 tablet; Refill: 2  5. Psychophysiological insomnia - ALPRAZolam (XANAX) 1 MG tablet; Take 1 tablet (1  mg total) by mouth 2 (two) times daily as needed for anxiety.  Dispense: 60 tablet; Refill: 2  6. Anxiety - continue therapy  7. Vitamin D deficiency - not currently taking supplementation, will check level, has been very low in the past - VITAMIN D 25 Hydroxy (Vit-D Deficiency, Fractures)  8. Screening for HIV without presence of risk factors - patient agrees to once in lifetime screening - HIV antibody  9. Need for hepatitis C screening test - patient agrees to once in lifetime scrreening - Hepatitis C Antibody  10. Hypothyroidism due to acquired atrophy of thyroid - TSH  11. Screening for colon cancer - she agrees to cologuard  - follow up in 3 months   Clarene Reamer, FNP-BC  [facility]  01/20/2017 3:10 PM

## 2017-01-20 NOTE — Progress Notes (Deleted)
Subjective:   Virginia Romero is a 61 y.o. female who presents for an Initial Medicare Annual Wellness Visit.  Review of Systems    ***        Objective:    Today's Vitals   01/20/17 1326  Weight: 210 lb 12.8 oz (95.6 kg)  Height: 5' 1.5" (1.562 m)   Body mass index is 39.19 kg/m.   Current Medications (verified) Outpatient Encounter Prescriptions as of 01/20/2017  Medication Sig  . ALPRAZolam (XANAX) 1 MG tablet Take 1 tablet (1 mg total) by mouth 2 (two) times daily as needed for anxiety.  . D3-50 50000 units capsule TAKE 1 CAPSULE BY MOUTH EVERY WEEK  . hydrochlorothiazide (HYDRODIURIL) 12.5 MG tablet Take 1 tablet (12.5 mg total) by mouth daily.  Marland Kitchen levothyroxine (SYNTHROID, LEVOTHROID) 200 MCG tablet TAKE 1 TABLET BY MOUTH ONCE DAILY BEFORE BREAKFAST  . metFORMIN (GLUCOPHAGE) 500 MG tablet Take 1 tablet (500 mg total) by mouth 2 (two) times daily with a meal.  . metoprolol tartrate (LOPRESSOR) 100 MG tablet Take 1 tablet (100 mg total) by mouth 2 (two) times daily.  . sertraline (ZOLOFT) 100 MG tablet Take 1 tablet (100 mg total) by mouth daily.  . Vitamin D, Ergocalciferol, (DRISDOL) 50000 units CAPS capsule Take 1 capsule (50,000 Units total) by mouth once a week.  . pravastatin (PRAVACHOL) 40 MG tablet Take 1 tablet (40 mg total) by mouth daily. (Patient not taking: Reported on 01/20/2017)  . [DISCONTINUED] meloxicam (MOBIC) 7.5 MG tablet Take 1 tablet (7.5 mg total) by mouth daily.  . [DISCONTINUED] methocarbamol (ROBAXIN) 500 MG tablet Take 1 tablet (500 mg total) by mouth 3 (three) times daily as needed.   No facility-administered encounter medications on file as of 01/20/2017.     Allergies (verified) Patient has no known allergies.   History: Past Medical History:  Diagnosis Date  . Anxiety   . Arthritis    KNEES AND LOWER BACK   . Cancer (HCC)    THYROID  . 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 Depression   . Diabetes  mellitus    Type II  . Endometrial polyp   . GERD (gastroesophageal reflux disease)   . History of ovarian cyst   . Hyperlipemia   . Hypertension   . Osteopenia 09/2016   T score -1.5  . PTSD (post-traumatic stress disorder)   . Shortness of breath dyspnea    with anxiety  . Thyroid disease    Past Surgical History:  Procedure Laterality Date  . CESAREAN SECTION  '79 AND '86   X2  . COLONOSCOPY     with polypectomy  . DIAGNOSTIC LAPAROSCOPY  07/17/2006   WITH LYSIS OF PERITUBULAR AND PERIOVARIAN ADHESIONS, LYSIS OF OMENTAL ADHESIONS, LSO, RIGHT  OVARIAN CYSTECTOMY , HYSTEROSCOPY  WITH EXCISION OF ENDOMETRIAL POLYP...  . ORIF HUMERUS FRACTURE Left 03/01/2015   Procedure: OPEN REDUCTION INTERNAL FIXATION (ORIF) PROXIMAL HUMERUS FRACTURE;  Surgeon: Netta Cedars, MD;  Location: [facility];  Service: Orthopedics;  Laterality: Left;  . THYROIDECTOMY  2000  . TUBAL LIGATION     Family History  Problem Relation Age of Onset  . Hypertension Mother   . Heart disease Mother   . Diabetes Brother   . Hypertension Maternal Grandmother   . Heart disease Maternal Grandmother   . Diabetes Maternal Grandfather   . Depression Daughter   . Diabetes Paternal Grandfather    Social History  Occupational History  . Not on file.   Social History Main Topics  . Smoking status: Current Every Day Smoker    Packs/day: 0.50    Years: 30.00    Types: Cigarettes  . Smokeless tobacco: Never Used  . Alcohol use No  . Drug use: No  . Sexual activity: No     Comment: 1 partner in last year    Tobacco Counseling Ready to quit: Not Answered Counseling given: Not Answered   Activities of Daily Living No flowsheet data found.  Immunizations and Health Maintenance Immunization History  Administered Date(s) Administered  . Pneumococcal Conjugate-13 02/16/2014  . Tdap 06/03/2006   Health Maintenance Due  Topic Date Due  . Hepatitis C Screening  05-Jan-1956  . PNEUMOCOCCAL POLYSACCHARIDE VACCINE  (1) 08/11/1958  . HIV Screening  08/11/1971  . COLONOSCOPY  08/11/2006  . PAP SMEAR  07/08/2015  . MAMMOGRAM  08/03/2015  . TETANUS/TDAP  06/03/2016  . FOOT EXAM  07/25/2016  . URINE MICROALBUMIN  11/27/2016  . INFLUENZA VACCINE  01/01/2017    Patient Care Team: Elby Beck, FNP as PCP - General (Nurse Practitioner) Huel Cote, NP as Nurse Practitioner (Obstetrics and Gynecology)  Indicate any recent Medical Services you may have received from other than [facility] providers in the past year (date may be approximate).     Assessment:   This is a routine wellness examination for Virginia Romero. ***  Hearing/Vision screen No exam data present  Dietary issues and exercise activities discussed:    Goals    None     Depression Screen PHQ 2/9 Scores 01/20/2017 01/06/2016 11/28/2015 06/27/2015 04/26/2015 02/23/2015 01/24/2015  PHQ - 2 Score 2 4 6 6 2 5 6   PHQ- 9 Score 10 17 12 23  - 15 22    Fall Risk Fall Risk  11/28/2015 07/26/2015 06/27/2015 04/26/2015 01/24/2015  Falls in the past year? Yes Yes Yes Yes No  Number falls in past yr: 1 2 or more 1 1 -  Injury with Fall? - Yes Yes Yes -  Comment - - - left shoulder fracture -    Cognitive Function:        Screening Tests Health Maintenance  Topic Date Due  . Hepatitis C Screening  1955/12/14  . PNEUMOCOCCAL POLYSACCHARIDE VACCINE (1) 08/11/1958  . HIV Screening  08/11/1971  . COLONOSCOPY  08/11/2006  . PAP SMEAR  07/08/2015  . MAMMOGRAM  08/03/2015  . TETANUS/TDAP  06/03/2016  . FOOT EXAM  07/25/2016  . URINE MICROALBUMIN  11/27/2016  . INFLUENZA VACCINE  01/01/2017  . HEMOGLOBIN A1C  05/02/2017  . OPHTHALMOLOGY EXAM  08/29/2017      Plan:   ***  I have personally reviewed and noted the following in the patient's chart:   . Medical and social history . Use of alcohol, tobacco or illicit drugs  . Current medications and supplements . Functional ability and status . Nutritional status . Physical  activity . Advanced directives . List of other physicians . Hospitalizations, surgeries, and ER visits in previous 12 months . Vitals . Screenings to include cognitive, depression, and falls . Referrals and appointments  In addition, I have reviewed and discussed with patient certain preventive protocols, quality metrics, and best practice recommendations. A written personalized care plan for preventive services as well as general preventive health recommendations were provided to patient.     [place], [place]   01/20/2017

## 2017-01-20 NOTE — Patient Instructions (Addendum)
Authors I have enjoyed- Lacy Duverney, Jan Karon, [place]  Please follow up with me at my new location- [facility]- [phone] in 4-6 months   Dear Virginia Romero , Thank you for taking time to come for your Medicare Wellness Visit. I appreciate your ongoing commitment to your health goals. Please review the following plan we discussed and let me know if I can assist you in the future.   These are the goals we discussed: Goals    Continue to make healthy food choices and increase exercise      This is a list of the screening recommended for you and due dates:  Health Maintenance  Topic Date Due  . Pneumococcal vaccine (1) 08/11/1958  . Colon Cancer Screening  08/11/2006  . Pap Smear  07/08/2015  . Mammogram  08/03/2015  . Tetanus Vaccine  06/03/2016  . Urine Protein Check  11/27/2016  . Flu Shot  01/01/2017  . Hemoglobin A1C  05/02/2017  . Eye exam for diabetics  08/29/2017

## 2017-01-21 ENCOUNTER — Encounter: Payer: Self-pay | Admitting: Family Medicine

## 2017-01-21 LAB — TSH: TSH: 7.58 u[IU]/mL — ABNORMAL HIGH (ref 0.35–4.50)

## 2017-01-21 LAB — CBC
HEMATOCRIT: 39.2 % (ref 36.0–46.0)
Hemoglobin: 13 g/dL (ref 12.0–15.0)
MCHC: 33.1 g/dL (ref 30.0–36.0)
MCV: 87.2 fl (ref 78.0–100.0)
Platelets: 381 10*3/uL (ref 150.0–400.0)
RBC: 4.5 Mil/uL (ref 3.87–5.11)
RDW: 15.4 % (ref 11.5–15.5)
WBC: 11 10*3/uL — ABNORMAL HIGH (ref 4.0–10.5)

## 2017-01-21 LAB — BASIC METABOLIC PANEL
BUN: 14 mg/dL (ref 6–23)
CALCIUM: 9.2 mg/dL (ref 8.4–10.5)
CO2: 25 meq/L (ref 19–32)
Chloride: 103 mEq/L (ref 96–112)
Creatinine, Ser: 0.91 mg/dL (ref 0.40–1.20)
GFR: 66.92 mL/min (ref >60.00)
Glucose, Bld: 98 mg/dL (ref 70–99)
POTASSIUM: 4 meq/L (ref 3.5–5.1)
SODIUM: 139 meq/L (ref 135–145)

## 2017-01-21 LAB — VITAMIN D 25 HYDROXY (VIT D DEFICIENCY, FRACTURES): VITD: 30 ng/mL (ref 30.00–100.00)

## 2017-01-21 LAB — MICROALBUMIN / CREATININE URINE RATIO
CREATININE, U: 127.1 mg/dL
MICROALB UR: 2 mg/dL — AB (ref 0.0–1.9)
MICROALB/CREAT RATIO: 1.6 mg/g (ref 0.0–30.0)

## 2017-01-21 LAB — HEPATITIS C ANTIBODY: HCV Ab: NONREACTIVE

## 2017-01-21 LAB — HIV ANTIBODY (ROUTINE TESTING W REFLEX): HIV 1&2 Ab, 4th Generation: NONREACTIVE

## 2017-01-22 ENCOUNTER — Other Ambulatory Visit (INDEPENDENT_AMBULATORY_CARE_PROVIDER_SITE_OTHER): Payer: Medicare Other

## 2017-01-22 DIAGNOSIS — E1169 Type 2 diabetes mellitus with other specified complication: Secondary | ICD-10-CM

## 2017-01-22 DIAGNOSIS — E785 Hyperlipidemia, unspecified: Principal | ICD-10-CM

## 2017-01-22 LAB — HEMOGLOBIN A1C: HEMOGLOBIN A1C: 6.5 % (ref 4.6–6.5)

## 2017-01-22 NOTE — Addendum Note (Signed)
Addended by: Clarene Reamer B on: 01/22/2017 09:30 AM   Modules accepted: Orders

## 2017-01-30 ENCOUNTER — Ambulatory Visit (INDEPENDENT_AMBULATORY_CARE_PROVIDER_SITE_OTHER): Payer: Medicare Other | Admitting: Psychology

## 2017-01-30 DIAGNOSIS — F331 Major depressive disorder, recurrent, moderate: Secondary | ICD-10-CM | POA: Diagnosis not present

## 2017-01-30 NOTE — Progress Notes (Signed)
   THERAPIST PROGRESS NOTE  Session Time: 9.48am-10.42am  Participation Level: Active  Behavioral Response: Well GroomedAlertDepressed  Type of Therapy: Individual Therapy  Treatment Goals addressed: Diagnosis: MDD and goal 1.  Interventions: CBT  Summary: Virginia Romero is a 61 y.o. female who presents with affect wnl.  Pt reported that she is still struggling w/ loneliness and self blame.  Pt discussed that she was able to set boundaries w/ female who makes her uncomfortable.  Pt increased awareness that she will still have to set these boundaries.  Pt reported that she did make contact w/ senior services and with mental health association and will is getting paperwork to start with them.  Pt discussed self blame for granddaughter being dx bipolar and others questioning her son being gay and feeling blame as well.  Pt was able to challenge this self blame and to identify ways of thinking that continue to challenge and assert herself to others if feels blaming.   Suicidal/Homicidal: Nowithout intent/plan  Therapist Response: Assessed pt current functioning per pt report. Processed w/pt her feelings of loneliness, actions she is taking to make health connections.  Assisted pt in challenging self blame distortions and ways of reframing and asserting self.   Plan: Return again in 2-3 weeks.  Diagnosis: MDD    Jan Fireman, [facility] 01/30/2017

## 2017-02-06 DIAGNOSIS — Z1211 Encounter for screening for malignant neoplasm of colon: Secondary | ICD-10-CM | POA: Diagnosis not present

## 2017-02-11 ENCOUNTER — Ambulatory Visit (INDEPENDENT_AMBULATORY_CARE_PROVIDER_SITE_OTHER): Payer: Medicare Other | Admitting: Psychology

## 2017-02-11 DIAGNOSIS — F331 Major depressive disorder, recurrent, moderate: Secondary | ICD-10-CM

## 2017-02-11 NOTE — Progress Notes (Signed)
   THERAPIST PROGRESS NOTE  Session Time: 8:03am-8.48am  Participation Level: Active  Behavioral Response: Well GroomedAlertDepressed  Type of Therapy: Individual Therapy  Treatment Goals addressed: Diagnosis: MDD and goal 1.  Interventions: CBT and Supportive  Summary: Virginia Romero is a 61 y.o. female who presents with affect congruent w/ report of depressed mood.  Pt reported that she had been feeling depressed in past week.  Pt reported that informed by front office staff that due to credential issues counselor wouldn't be able to continue w/ pt.  Pt expressed disappointment and loss.  Pt discussed also loss experienced this week when tree workers removed trees in neighboring property but destroyed her garden.  Pt was able to express that although difficult w/ loss and change and worry of not connecting w/ another therapist- that has connected w/ multiple therapist in past and probable again.   Suicidal/Homicidal: Nowithout intent/plan  Therapist Response: Assessed pt current functioning per pt report.  Processed w/pt coping w/ depression, loss and loss of therapy relationship.  Discussed transition to a new therapist and assisted in challenging distortions that won't be good.  Validated pt concerns and her feelings of loss.   Plan: due to credentialing issues pt is being referred to LCSW to continue her tx.   Diagnosis: MDD    Jan Fireman, [facility] 02/11/2017

## 2017-02-13 LAB — COLOGUARD: Cologuard: NEGATIVE

## 2017-02-17 ENCOUNTER — Encounter: Payer: Self-pay | Admitting: Family Medicine

## 2017-03-13 ENCOUNTER — Encounter: Payer: Self-pay | Admitting: Family Medicine

## 2017-03-21 DIAGNOSIS — Z1231 Encounter for screening mammogram for malignant neoplasm of breast: Secondary | ICD-10-CM | POA: Diagnosis not present

## 2017-03-25 ENCOUNTER — Ambulatory Visit: Payer: Self-pay

## 2017-03-31 ENCOUNTER — Encounter (HOSPITAL_COMMUNITY): Payer: Self-pay | Admitting: Licensed Clinical Social Worker

## 2017-03-31 ENCOUNTER — Ambulatory Visit (INDEPENDENT_AMBULATORY_CARE_PROVIDER_SITE_OTHER): Payer: Medicare Other | Admitting: Licensed Clinical Social Worker

## 2017-03-31 DIAGNOSIS — F331 Major depressive disorder, recurrent, moderate: Secondary | ICD-10-CM | POA: Diagnosis not present

## 2017-03-31 NOTE — Progress Notes (Signed)
   THERAPIST PROGRESS NOTE  Session Time: 9:00am-10:00am  Participation Level: Active  Behavioral Response: Well GroomedAlertEuthymic  Type of Therapy: Individual Therapy  Treatment Goals addressed: Improve Psychiatric Symptoms, elevate mood (increased self-esteem, increased self-compassion, increased interaction), improve unhelpful thought patterns, controlled behavior, moderate mood, deliberate speech and thought process(improved social functioning, healthy adjustment to living situation), Learn about diagnosis, healthy coping skills  Interventions: Motivational Interviewing, CBT, Grounding & Mindfulness Techniques, psychoeducation  Summary: Else Ruland is a 61 y.o. female who presents with Major Depressive Disorder, recurrent episode, moderate.  Suicidal/Homicidal: No - without intent/plan  Therapist Response: Zareen met with clinician for an individual session.  Whittany discussed her psychiatric symptoms, her current life events and her homework. Moet shared that she has been feeling more worried and depressed lately, especially due to the news. She reports she cannot help but turn on the news daily and will often cry at what she sees. Clinician discussed finding a balance between being informed and maintaining her own mood stability. Porschia reports ongoing positive support from her children and friends. She reports concern about her granddaughter, who has recently been diagnosed with Bipolar Disorder. Clinician provided resources and encouraged her to be hopeful and to speak hopeful language to her daughter and granddaughter about medication management and counseling. Kemari reports high levels of loneliness. Clinician provided referral information for the [facility] and [facility] for groups.   Plan: Return again in 4 weeks  Diagnosis:     Axis I: Major Depressive Disorder, recurrent episode, moderate     Jessica R Schlosberg, LCSW 03/31/2017  

## 2017-04-01 ENCOUNTER — Ambulatory Visit (INDEPENDENT_AMBULATORY_CARE_PROVIDER_SITE_OTHER): Payer: Medicare Other

## 2017-04-01 ENCOUNTER — Encounter: Payer: Self-pay | Admitting: Family Medicine

## 2017-04-01 DIAGNOSIS — Z23 Encounter for immunization: Secondary | ICD-10-CM

## 2017-04-03 ENCOUNTER — Encounter: Payer: Self-pay | Admitting: Family Medicine

## 2017-04-07 ENCOUNTER — Telehealth (HOSPITAL_COMMUNITY): Payer: Self-pay | Admitting: Psychology

## 2017-04-07 NOTE — Telephone Encounter (Signed)
04/07/17  Estill Bamberg from [facility] [phone] called to verify patient's last visit and next visit with therapist - claim #[id number].Marland KitchenMariana Kaufman

## 2017-04-09 DIAGNOSIS — H43812 Vitreous degeneration, left eye: Secondary | ICD-10-CM | POA: Diagnosis not present

## 2017-04-10 NOTE — Telephone Encounter (Signed)
Done

## 2017-05-03 ENCOUNTER — Other Ambulatory Visit: Payer: Self-pay | Admitting: Family Medicine

## 2017-05-05 ENCOUNTER — Ambulatory Visit (HOSPITAL_COMMUNITY): Payer: Self-pay | Admitting: Licensed Clinical Social Worker

## 2017-05-14 ENCOUNTER — Other Ambulatory Visit: Payer: Self-pay | Admitting: Family Medicine

## 2017-05-14 DIAGNOSIS — F5104 Psychophysiologic insomnia: Secondary | ICD-10-CM

## 2017-05-14 DIAGNOSIS — F419 Anxiety disorder, unspecified: Principal | ICD-10-CM

## 2017-05-14 DIAGNOSIS — F329 Major depressive disorder, single episode, unspecified: Secondary | ICD-10-CM

## 2017-05-14 DIAGNOSIS — F32A Depression, unspecified: Secondary | ICD-10-CM

## 2017-05-15 IMAGING — RF DG C-ARM 61-120 MIN
1 series · 4 of 4 positions shown · non-contrast
Comparison: None.

CLINICAL DATA: Left humeral neck fracture.  Subsequent encounter.

EXAM:
DG C-ARM 61-120 MIN; LEFT SHOULDER - 2+ VIEW

[Series 1: run · 4 of 4 slices shown]
[im 1/4]
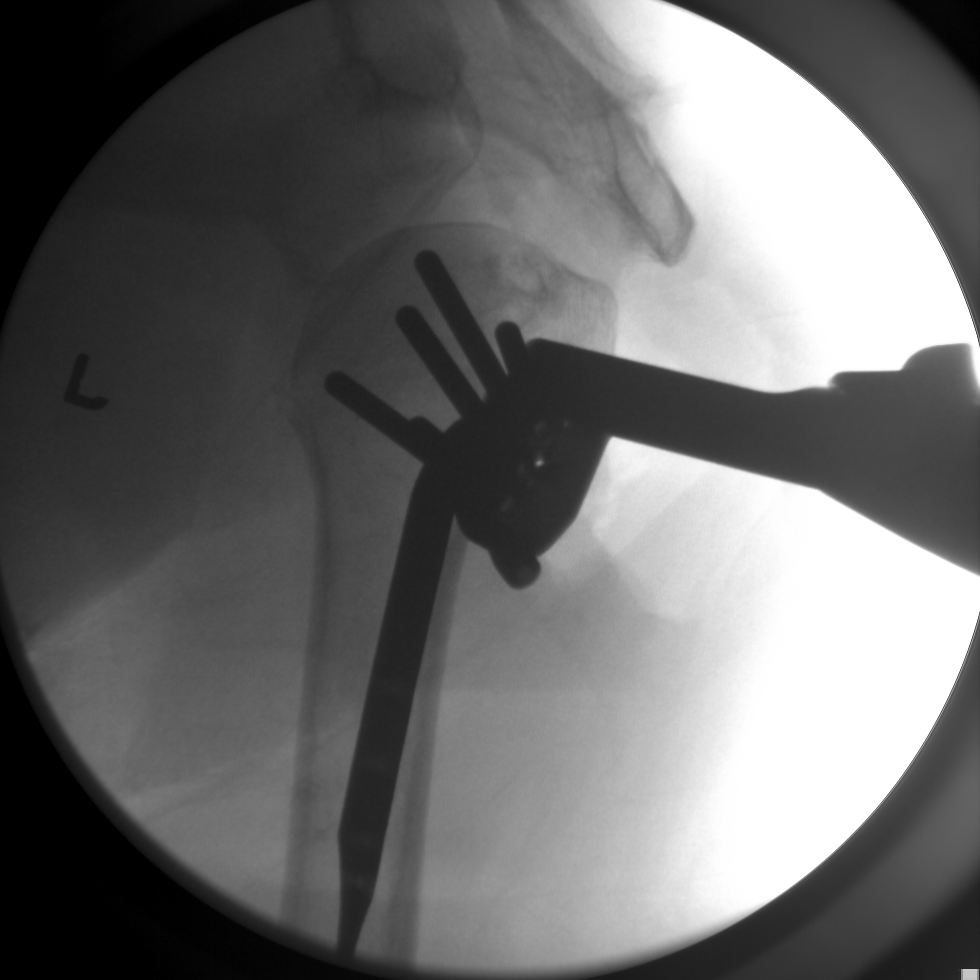
[im 2/4]
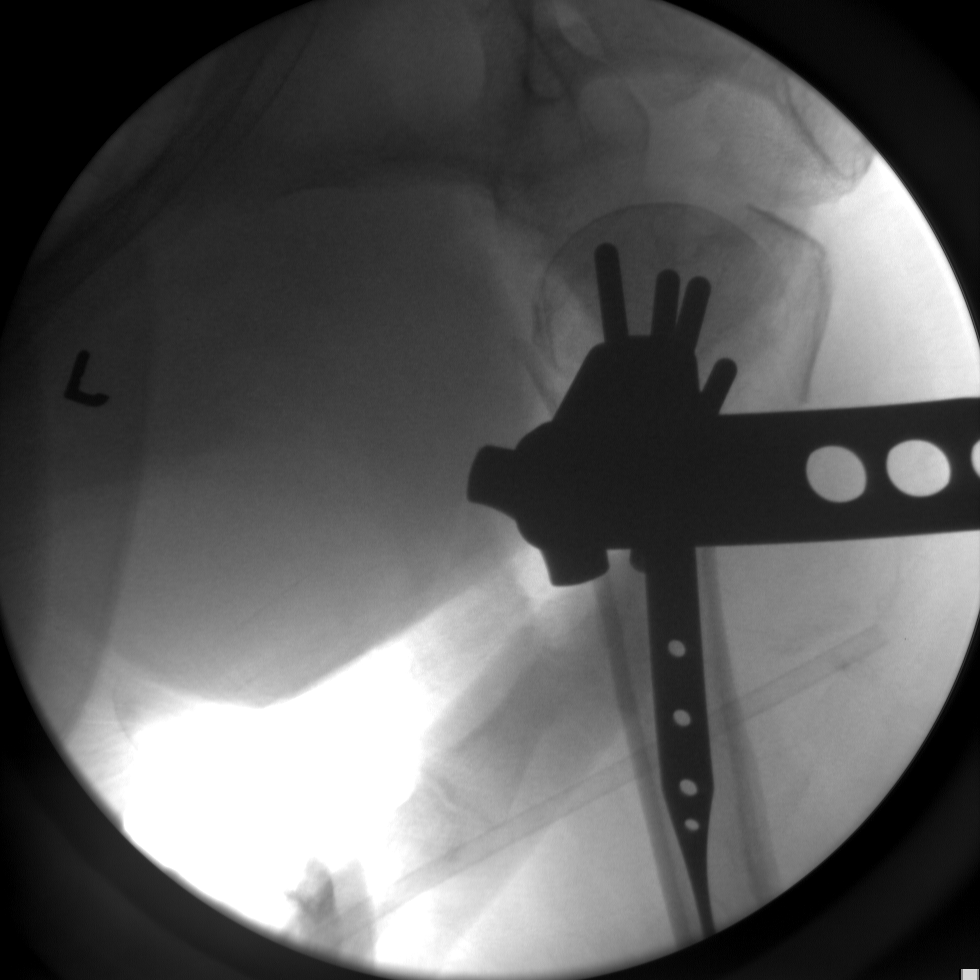
[im 3/4]
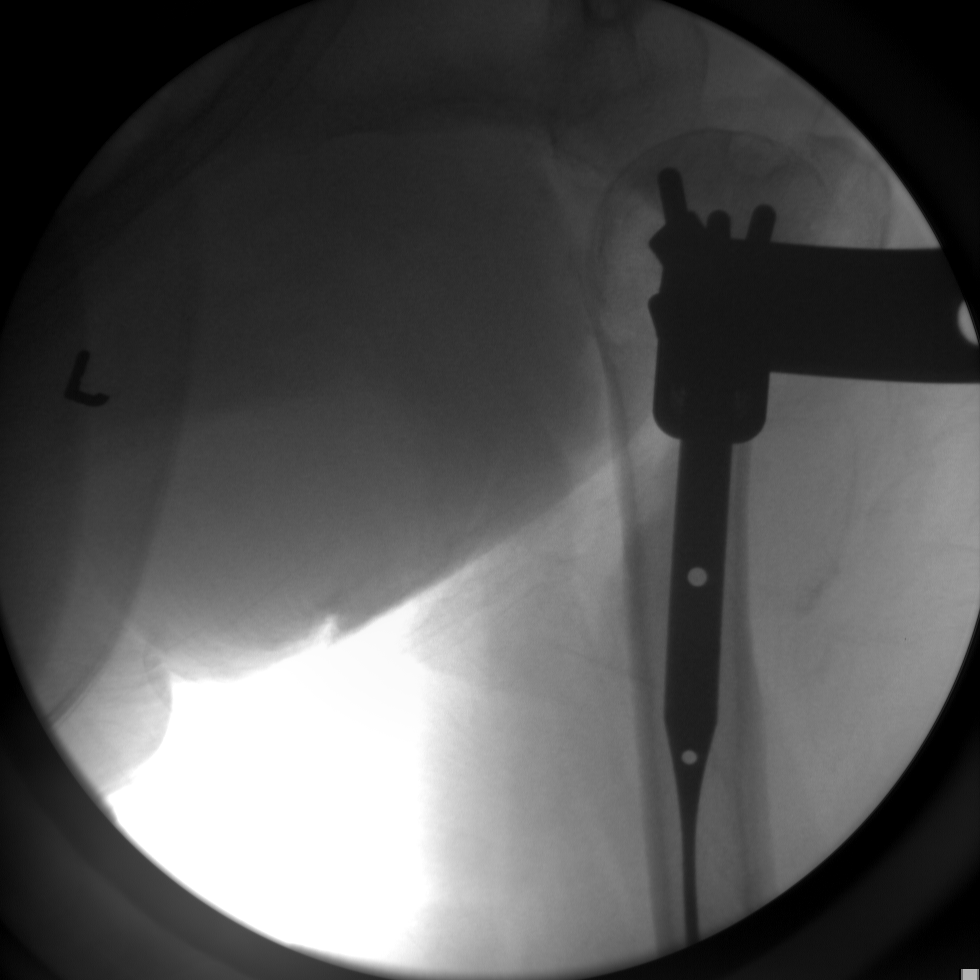
[im 4/4]
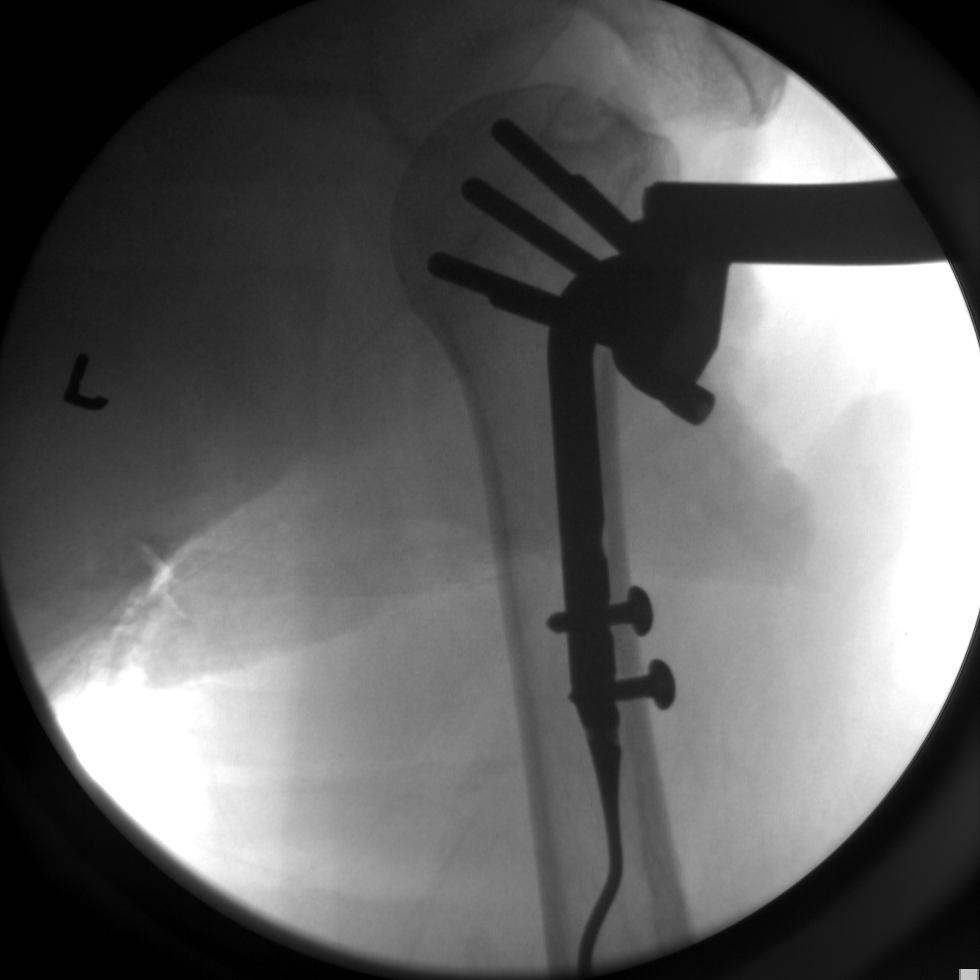

[4 of 4 positions shown; findings below may reference images not displayed]

FINDINGS: A series of 4 spot images obtained intraoperatively show placement
of fixation plate and screws transfixing a proximal humeral fracture
in near anatomic alignment.
IMPRESSION: Internal fixation of proximal humeral fracture in near anatomic
alignment.

## 2017-05-15 IMAGING — DX DG SHOULDER 1V*L*
1 series · 1 of 1 positions shown · non-contrast
Comparison: Preoperative exam 02/23/2015

CLINICAL DATA: Left shoulder fracture, closed, postop. Initial
encounter.

EXAM:
LEFT SHOULDER - 1 VIEW

[shoulder ap]
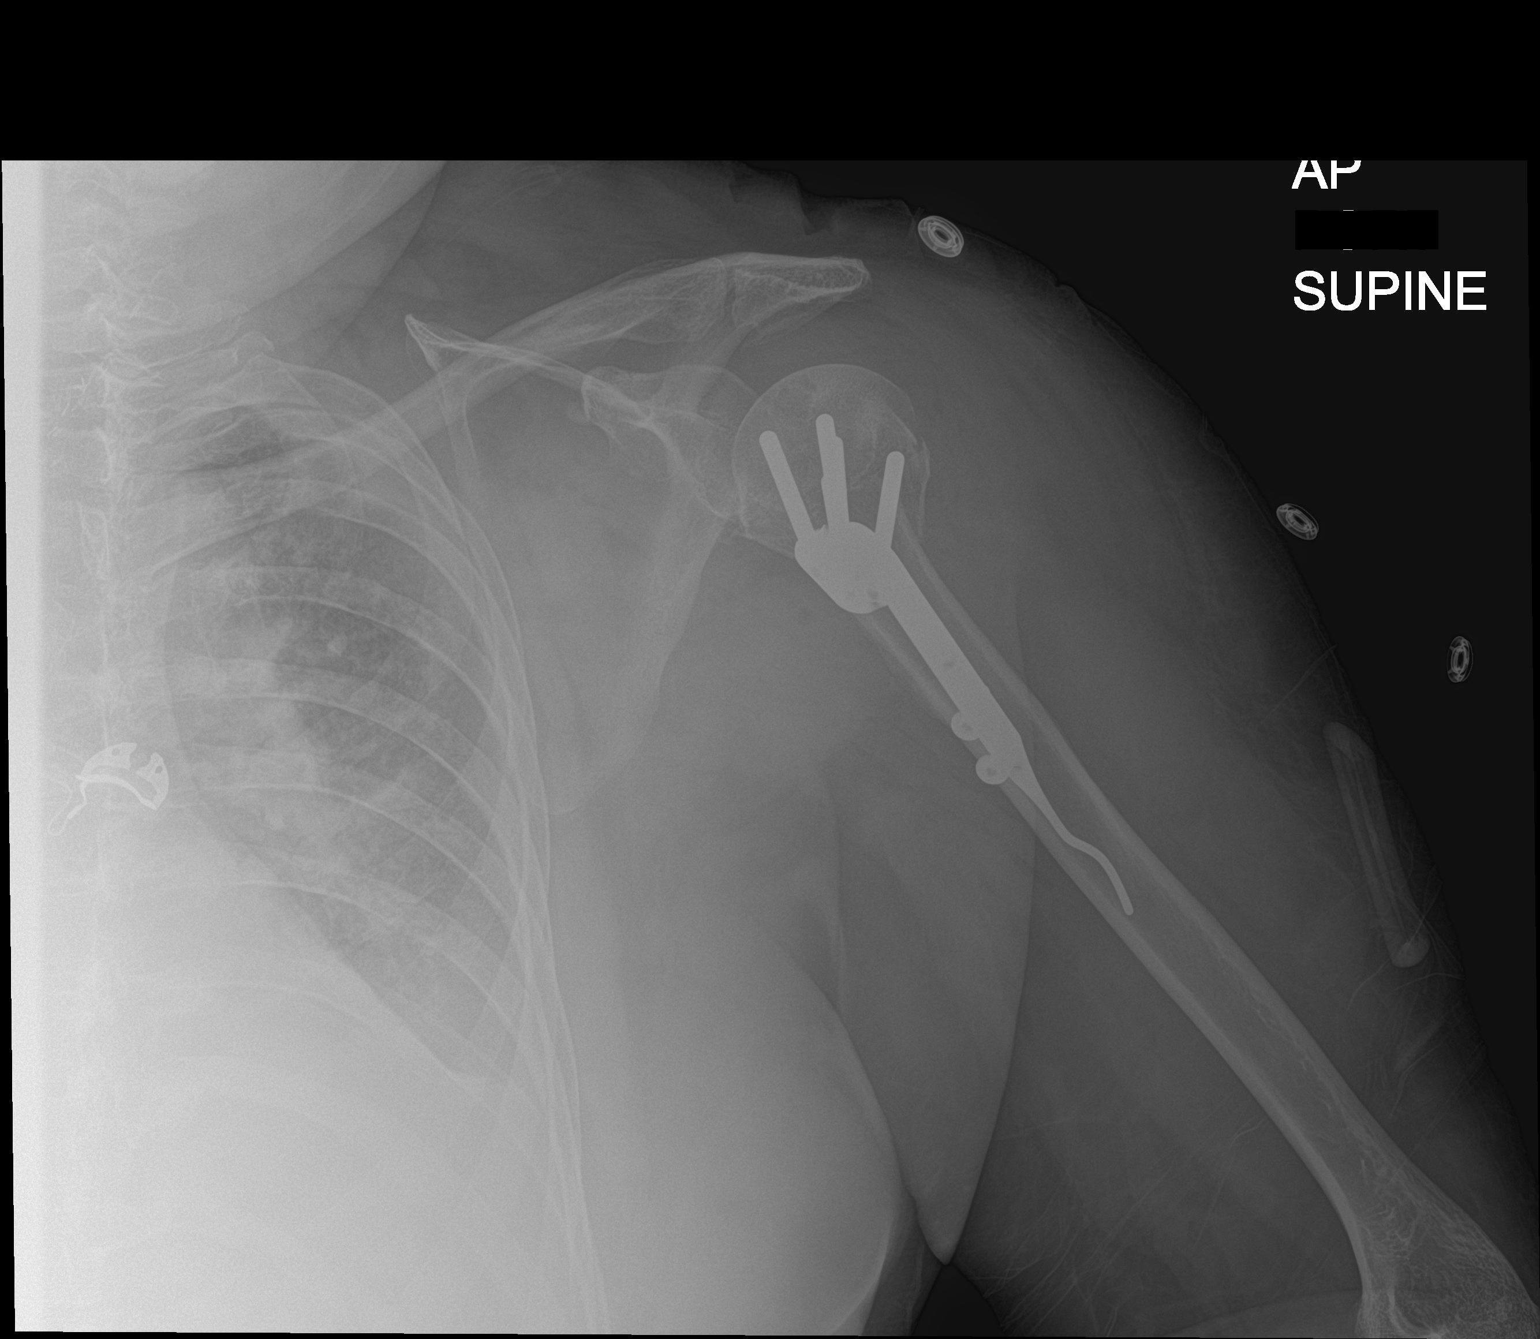

[1 of 1 positions shown; findings below may reference images not displayed]

FINDINGS: Single portable AP view of the left shoulder demonstrates plate and
multi screw fixation of displaced proximal humerus fracture. There
is improved fracture alignment with decreased displacement compared
to preoperative exam. The acromioclavicular joint remains congruent.
IMPRESSION: Improved proximal humerus fracture alignment post ORIF.

## 2017-05-16 ENCOUNTER — Telehealth: Payer: Self-pay | Admitting: Family Medicine

## 2017-05-16 NOTE — Telephone Encounter (Signed)
Please call in to patient's pharmacy.

## 2017-05-16 NOTE — Telephone Encounter (Signed)
[facility] at [address] ([address]) called for xanax refill. Refill called in as instructed to [address].

## 2017-05-16 NOTE — Telephone Encounter (Signed)
Copied from [place]. Topic: General - Other >> May 16, 2017  3:11 PM Neva Seat wrote:  [facility] [phone] Stated pt's Alprazolam could not be refilled.  Pharmacy is saying the refill was not approved for refill.  Pt says Dr. Carlean Purl has filled the Rx in the past. Pt wants to know why it couldn't be refilled. Please call pt to let her know what she will need to do.

## 2017-05-19 NOTE — Telephone Encounter (Signed)
Pt. Received Alprazolam and scheduled an appointment for follow up.

## 2017-06-05 ENCOUNTER — Ambulatory Visit (INDEPENDENT_AMBULATORY_CARE_PROVIDER_SITE_OTHER): Payer: Medicare Other | Admitting: Licensed Clinical Social Worker

## 2017-06-05 ENCOUNTER — Encounter (HOSPITAL_COMMUNITY): Payer: Self-pay | Admitting: Licensed Clinical Social Worker

## 2017-06-05 DIAGNOSIS — F332 Major depressive disorder, recurrent severe without psychotic features: Secondary | ICD-10-CM | POA: Diagnosis not present

## 2017-06-05 NOTE — Progress Notes (Signed)
   THERAPIST PROGRESS NOTE  Session Time: 9:00am-9:50am  Participation Level: Active  Behavioral Response: NeatAlertDepressed  Type of Therapy: Individual Therapy  Treatment Goals addressed: Improve psychiatric symptoms, elevate mood and functioning, Improve unhelpful thought patterns, Stress management  Interventions: CBT and Motivational Interviewing, grounding and mindfulness techniques, psychoeducation  Summary: Virginia Romero is a 62 y.o. female who presents with Major Depressive Disorder, recurrent, severe   Suicidal/Homicidal: No - without intent/plan  Therapist Response: Manuela Schwartz met with clinician for an individual session. Virginia Romero discussed her psychiatric symptoms, her current life events, and her homework. Virginia Romero shared that she has been extremely down and depressed over the past few weeks. She reports not much has made her happy and she feels like her life is meaningless. She denied active suicidal thoughts, but also reported that she felt she had nothing to offer. Virginia Romero discussed her interactions with family and friends over the holidays and reported sadness that she did not get much time with her children or grandchildren. Virginia Romero also reported feeling terrible that she cannot provide what her ex-husband can for the children (cruises, etc).  Clinician identified increased depression with isolation and explored ways to get Virginia Romero out of the house and social again. Virginia Romero reports this is true and she has been asked to come back and volunteer/work with [employer]. She was uncertain whether she wanted to really commit to anything at this time.    Plan: Return again in 2-3 weeks.  Diagnosis:     Axis I: Major Depressive Disorder, recurrent, severe    Mindi Curling, LCSW 06/05/2017

## 2017-06-09 ENCOUNTER — Telehealth: Payer: Self-pay | Admitting: Family Medicine

## 2017-06-09 ENCOUNTER — Ambulatory Visit
Admission: RE | Admit: 2017-06-09 | Discharge: 2017-06-09 | Disposition: A | Discharge status: Home / Self Care | Payer: Medicare Other | Source: Ambulatory Visit | Attending: Family Medicine | Admitting: Family Medicine

## 2017-06-09 ENCOUNTER — Ambulatory Visit (INDEPENDENT_AMBULATORY_CARE_PROVIDER_SITE_OTHER): Payer: Medicare Other | Admitting: Family Medicine

## 2017-06-09 ENCOUNTER — Encounter: Payer: Self-pay | Admitting: Family Medicine

## 2017-06-09 VITALS — BP 164/88 | HR 66 | Temp 98.0°F | Wt 206.0 lb

## 2017-06-09 DIAGNOSIS — E1169 Type 2 diabetes mellitus with other specified complication: Secondary | ICD-10-CM

## 2017-06-09 DIAGNOSIS — E785 Hyperlipidemia, unspecified: Secondary | ICD-10-CM | POA: Diagnosis not present

## 2017-06-09 DIAGNOSIS — F329 Major depressive disorder, single episode, unspecified: Secondary | ICD-10-CM | POA: Diagnosis not present

## 2017-06-09 DIAGNOSIS — F419 Anxiety disorder, unspecified: Secondary | ICD-10-CM

## 2017-06-09 DIAGNOSIS — M79604 Pain in right leg: Secondary | ICD-10-CM

## 2017-06-09 DIAGNOSIS — I10 Essential (primary) hypertension: Secondary | ICD-10-CM | POA: Diagnosis not present

## 2017-06-09 DIAGNOSIS — E034 Atrophy of thyroid (acquired): Secondary | ICD-10-CM | POA: Diagnosis not present

## 2017-06-09 DIAGNOSIS — R600 Localized edema: Secondary | ICD-10-CM | POA: Diagnosis not present

## 2017-06-09 DIAGNOSIS — F32A Depression, unspecified: Secondary | ICD-10-CM

## 2017-06-09 NOTE — Patient Instructions (Signed)
You can take 3 ibuprofen (200 mg each) every 8 hours and 2 tylenol every hours- can alternate  I will notify you of results  I will reach out to [facility] [place] [facility] about medication and I will call you.

## 2017-06-09 NOTE — Progress Notes (Signed)
Subjective:    Patient ID: Virginia Romero, female    DOB: Oct 05, 1955, 62 y.o.   MRN: [id number]  HPI This is a 62 yo female who presents today for follow up of depression, diabetes and for new right leg pain.  Right leg pain and swelling- new pain and swelling over the last 2 weeks.  Pain at back of leg above and below knee.  Some radiation up toward the buttock.  Legs seems swollen to her.  No redness.  No recent falls or injury.  Increased pain with walking and pain at rest.  She has never had pain like this before.  She has tried acetaminophen and ibuprofen with little relief.  Depression- Has had worsening depression x 1 month. She is currently seeing therapist Carlus Pavlov at [facility]. She had to stop seeing Jan Fireman due to insurance issues. She feels that it is hard for someone new to get to know her. She denies suicidal plan but states that she wonders what the point is of going on.  She feels like she is "old," and there is no point of continuing.  She refers to a suicide attempt many years ago and states that "it would never be worth it to try that again." She feels like a lot of things have come to a head recently with being alone, her financial situation and not seeing her children frequently.  She feels that the severity of her depression is worse than it should be.  She has been off her sertraline for quite a while now.  She does not think it really helped her. Has been on many different psych medications in the past including seroquel, lithium, and multiple others. Information not available in Epic. She does not like taking medications but feels that she needs to be on something for depression at this point.  She reports that she takes her blood pressure medicine and thyroid medicine and Xanax regularly.  Hypertension -she has been taking her metoprolol and HCTZ daily.  Stopped benazepril because it caused her to cough all the time.  She denies chest pain, shortness of breath, does  have some lower extremity edema by end of day.  Past Medical History:  Diagnosis Date  . Anxiety   . Arthritis    KNEES AND LOWER BACK   . Cancer (HCC)    THYROID  . 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 Depression   . Diabetes mellitus    Type II  . Endometrial polyp   . GERD (gastroesophageal reflux disease)   . History of ovarian cyst   . Hyperlipemia   . Hypertension   . Osteopenia 09/2016   T score -1.5  . PTSD (post-traumatic stress disorder)   . Shortness of breath dyspnea    with anxiety  . Thyroid disease    Past Surgical History:  Procedure Laterality Date  . CESAREAN SECTION  '79 AND '86   X2  . COLONOSCOPY     with polypectomy  . DIAGNOSTIC LAPAROSCOPY  07/17/2006   WITH LYSIS OF PERITUBULAR AND PERIOVARIAN ADHESIONS, LYSIS OF OMENTAL ADHESIONS, LSO, RIGHT  OVARIAN CYSTECTOMY , HYSTEROSCOPY  WITH EXCISION OF ENDOMETRIAL POLYP...  . ORIF HUMERUS FRACTURE Left 03/01/2015   Procedure: OPEN REDUCTION INTERNAL FIXATION (ORIF) PROXIMAL HUMERUS FRACTURE;  Surgeon: Netta Cedars, MD;  Location: [facility];  Service: Orthopedics;  Laterality: Left;  . THYROIDECTOMY  2000  . TUBAL LIGATION  Family History  Problem Relation Age of Onset  . Hypertension Mother   . Heart disease Mother   . Diabetes Brother   . Hypertension Maternal Grandmother   . Heart disease Maternal Grandmother   . Diabetes Maternal Grandfather   . Depression Daughter   . Diabetes Paternal Grandfather    Social History   Tobacco Use  . Smoking status: Current Every Day Smoker    Packs/day: 0.50    Years: 30.00    Pack years: 15.00    Types: Cigarettes  . Smokeless tobacco: Never Used  Substance Use Topics  . Alcohol use: No  . Drug use: No      Review of Systems Per HPI    Objective:   Physical Exam  Constitutional: She is oriented to person, place, and time. She appears well-developed and well-nourished. No distress.  HENT:  Head: Normocephalic  and atraumatic.  Cardiovascular: Normal rate, regular rhythm and normal heart sounds.  Pulmonary/Chest: Effort normal and breath sounds normal.  Musculoskeletal:  Global swelling around right knee, no erythema, no ecchymosis, she is very tender along the posterior leg above and below popliteal fossa.   Neurological: She is alert and oriented to person, place, and time.  Skin: Skin is warm and dry. She is not diaphoretic.  Psychiatric: Her behavior is normal. Judgment and thought content normal.  Affect flat today.   Vitals reviewed.     BP (!) 198/100 (BP Location: Right Arm, Patient Position: Sitting, Cuff Size: Normal)   Pulse 66   Temp 98 F (36.7 C) (Oral)   Wt 206 lb (93.4 kg)   SpO2 96%   BMI 38.29 kg/m  Wt Readings from Last 3 Encounters:  06/09/17 206 lb (93.4 kg)  01/20/17 210 lb 12.8 oz (95.6 kg)  10/30/16 212 lb 6.4 oz (96.3 kg)   BP Readings from Last 3 Encounters:  06/09/17 (!) 164/88  01/20/17 (!) 164/98  10/30/16 (!) 146/80   Depression screen PHQ 2/9 06/09/2017 01/20/2017 01/06/2016 11/28/2015 06/27/2015  Decreased Interest 3 1 2 3 3   Down, Depressed, Hopeless 3 1 2 3 3   PHQ - 2 Score 6 2 4 6 6   Altered sleeping 3 1 2 1 3   Tired, decreased energy 3 1 2 3 3   Change in appetite 2 3 2  - 3  Feeling bad or failure about yourself  3 3 2 1 3   Trouble concentrating 3 0 2 1 3   Moving slowly or fidgety/restless 3 - 2 0 2  Suicidal thoughts 2 0 1 0 0  PHQ-9 Score 25 10 17 12 23   Difficult doing work/chores - Not difficult at all - - Extremely dIfficult  Some recent data might be hidden       Assessment & Plan:  1. Right leg pain - US Venous Img Lower Unilateral Right; Future  2. DM type 2 with diabetic dyslipidemia ([place]) - Lipid panel; Future - Hemoglobin A1c; Future - TSH; Future  3. Essential hypertension - consistently elevated, did not tolerate benazepril, will add losartan - Lipid panel; Future - Hemoglobin A1c; Future - TSH; Future - losartan  (COZAAR) 100 MG tablet; Take 0.5 tablets (50 mg total) by mouth daily.  Dispense: 30 tablet; Refill: 1  4. Hypothyroidism due to acquired atrophy of thyroid - TSH; Future  5. Anxiety and depression - will try SNRI and touch base with her therapist, if no improvement, will have her see psychiatry - DULoxetine (CYMBALTA) 20 MG capsule; Take 1 capsule (20 mg  total) by mouth daily.  Dispense: 30 capsule; Refill: 3  - follow up in 1 month  Clarene Reamer, FNP-BC  [facility], [place]  06/11/2017 4:43 PM

## 2017-06-09 NOTE — Telephone Encounter (Signed)
Copied from [place] [id number]. Topic: Quick Communication - Other Results >> Jun 09, 2017  5:19 PM Cecelia Byars, NT wrote: Reason for CRM: Patient called about her results from her sonogram today please call [phone] she did not know she was supposed to have blood work, please accept her apology

## 2017-06-10 ENCOUNTER — Encounter: Payer: Self-pay | Admitting: Family Medicine

## 2017-06-10 NOTE — Telephone Encounter (Signed)
Please Advise. Awaiting note from NP [facility]. Call report yesterday informed me of no DVT. Okay to call and inform patient?

## 2017-06-10 NOTE — Telephone Encounter (Signed)
I spoke with her 06/09/17.

## 2017-06-11 ENCOUNTER — Encounter: Payer: Self-pay | Admitting: Family Medicine

## 2017-06-11 MED ORDER — LOSARTAN POTASSIUM 100 MG PO TABS
50.0000 mg | ORAL_TABLET | Freq: Every day | ORAL | 1 refills | Status: DC
Start: 2017-06-11 — End: 2017-08-19

## 2017-06-11 MED ORDER — DULOXETINE HCL 20 MG PO CPEP: 20.0000 mg | ORAL_CAPSULE | Freq: Every day | ORAL | 3 refills | Status: DC

## 2017-06-20 ENCOUNTER — Other Ambulatory Visit: Payer: Self-pay | Admitting: Family Medicine

## 2017-06-20 MED ORDER — MELOXICAM 15 MG PO TABS: 15.0000 mg | ORAL_TABLET | Freq: Every day | ORAL | 1 refills | Status: DC | PRN

## 2017-06-25 ENCOUNTER — Telehealth: Payer: Self-pay | Admitting: Family Medicine

## 2017-06-25 ENCOUNTER — Other Ambulatory Visit: Payer: Self-pay

## 2017-06-25 DIAGNOSIS — E034 Atrophy of thyroid (acquired): Secondary | ICD-10-CM

## 2017-06-25 MED ORDER — LEVOTHYROXINE SODIUM 200 MCG PO TABS: ORAL_TABLET | ORAL | 5 refills | Status: DC

## 2017-06-25 NOTE — Telephone Encounter (Signed)
Copied from [place]. Topic: Quick Communication - See Telephone Encounter >> Jun 25, 2017 12:17 PM Ivar Drape wrote: CRM for notification. See Telephone encounter for:  06/25/17. Patient would like a refill on her levothyroxine (SYNTHROID, LEVOTHROID) 200 MCG tablet medication.  Please send it to her preferred pharmacist [facility] [phone].  She is completely out and haven't had any for a few days.

## 2017-06-26 NOTE — Telephone Encounter (Addendum)
Pt following up on this request for levothyroxine (SYNTHROID, LEVOTHROID) 200 MCG tablet  Pt has been out for 3 days.  [facility] states [facility] has been fax[facility] for 5 days, but I do not see anything except CRM. Thank you!!  Isabella, Courtland Jennings [phone] (Phone) [phone] (Fax)   (may have been sent to wrong pharmacy)

## 2017-06-26 NOTE — Telephone Encounter (Signed)
[place] called to ask if [facility] could be called to retrieve the levothyroxine 200 mcg prescription sent on 06/25/17, the patient requests medication to be filled at [facility], I gave a verbal prescription refill to [facility] as instructed by pharmacy associate-Levothyroxin 200 mcg daily 30 tabs/5 refills by Clarene Reamer, FNP.

## 2017-07-02 ENCOUNTER — Ambulatory Visit: Payer: Self-pay | Admitting: Family Medicine

## 2017-07-13 ENCOUNTER — Other Ambulatory Visit: Payer: Self-pay | Admitting: Family Medicine

## 2017-07-13 DIAGNOSIS — E1169 Type 2 diabetes mellitus with other specified complication: Secondary | ICD-10-CM

## 2017-07-13 DIAGNOSIS — E785 Hyperlipidemia, unspecified: Principal | ICD-10-CM

## 2017-07-16 ENCOUNTER — Encounter: Payer: Self-pay | Admitting: Family Medicine

## 2017-07-23 ENCOUNTER — Ambulatory Visit (INDEPENDENT_AMBULATORY_CARE_PROVIDER_SITE_OTHER): Payer: Medicare Other | Admitting: Licensed Clinical Social Worker

## 2017-07-23 ENCOUNTER — Encounter (HOSPITAL_COMMUNITY): Payer: Self-pay | Admitting: Licensed Clinical Social Worker

## 2017-07-23 DIAGNOSIS — F332 Major depressive disorder, recurrent severe without psychotic features: Secondary | ICD-10-CM

## 2017-07-23 NOTE — Progress Notes (Signed)
   THERAPIST PROGRESS NOTE  Session Time: 10:00am-11:00am  Participation Level: Active  Behavioral Response: Well GroomedAlertDepressed   Type of Therapy: Individual Therapy  Treatment Goals addressed: Improve psychiatric symptoms, elevate mood and functioning, Improve unhelpful thought patterns, Stress management  Interventions: CBT and Motivational Interviewing, grounding and mindfulness techniques, psychoeducation  Summary: Virginia Romero is a 62 y.o. female who presents with Major Depressive Disorder, recurrent, severe   Suicidal/Homicidal: No - without intent/plan  Therapist Response: Manuela Schwartz met with clinician for an individual session. Virginia Romero discussed her psychiatric symptoms, her current life events, and her homework. Virginia Romero shared that she has started taking medication for depression, but she does not feel better yet. Clinician provided reality testing about her expectations of medications, as well as explored her behavioral skills that will assist in medication working. Virginia Romero reports she continues to maintain her home, care for her animals, and engage in some social interactions. However, she reports she often turns down invitations and she feels lonely. Virginia Romero repeated several times that she feels lonely and she does not feel like her friends who are married have time for her. Clinician explored thoughts and feelings about getting out, making friends,or volunteering back at [employer]. Virginia Romero considered volunteering due to not having a set schedule or a requirement for attendance. Clinician noted that Virginia Romero is a warm and social person that can make a difference to others because she accepts others for who they are.   Plan: Return again in 3-4 weeks.  Diagnosis:     Axis I: Major Depressive Disorder, recurrent, severe   Mindi Curling, LCSW 07/23/2017

## 2017-08-02 ENCOUNTER — Other Ambulatory Visit: Payer: Self-pay | Admitting: Family Medicine

## 2017-08-02 DIAGNOSIS — F329 Major depressive disorder, single episode, unspecified: Secondary | ICD-10-CM

## 2017-08-02 DIAGNOSIS — F419 Anxiety disorder, unspecified: Principal | ICD-10-CM

## 2017-08-02 DIAGNOSIS — F5104 Psychophysiologic insomnia: Secondary | ICD-10-CM

## 2017-08-02 DIAGNOSIS — F32A Depression, unspecified: Secondary | ICD-10-CM

## 2017-08-04 NOTE — Telephone Encounter (Signed)
Last Rx 05/16/17 #60 2r.  Last OV 06/2017

## 2017-08-08 ENCOUNTER — Other Ambulatory Visit (INDEPENDENT_AMBULATORY_CARE_PROVIDER_SITE_OTHER): Payer: Medicare Other

## 2017-08-08 DIAGNOSIS — E785 Hyperlipidemia, unspecified: Secondary | ICD-10-CM | POA: Diagnosis not present

## 2017-08-08 DIAGNOSIS — E1169 Type 2 diabetes mellitus with other specified complication: Secondary | ICD-10-CM

## 2017-08-08 DIAGNOSIS — I10 Essential (primary) hypertension: Secondary | ICD-10-CM

## 2017-08-08 DIAGNOSIS — E034 Atrophy of thyroid (acquired): Secondary | ICD-10-CM | POA: Diagnosis not present

## 2017-08-08 LAB — LIPID PANEL
Cholesterol: 225 mg/dL — ABNORMAL HIGH (ref 0–200)
HDL: 30 mg/dL — AB (ref >39.00)
NonHDL: 194.94
Total CHOL/HDL Ratio: 7
Triglycerides: 300 mg/dL — ABNORMAL HIGH (ref 0.0–149.0)
VLDL: 60 mg/dL — ABNORMAL HIGH (ref 0.0–40.0)

## 2017-08-08 LAB — TSH: TSH: 2.42 u[IU]/mL (ref 0.35–4.50)

## 2017-08-08 LAB — HEMOGLOBIN A1C: HEMOGLOBIN A1C: 6.3 % (ref 4.6–6.5)

## 2017-08-08 LAB — LDL CHOLESTEROL, DIRECT: LDL DIRECT: 152 mg/dL

## 2017-08-13 ENCOUNTER — Encounter: Payer: Self-pay | Admitting: Family Medicine

## 2017-08-14 ENCOUNTER — Other Ambulatory Visit: Payer: Self-pay | Admitting: Family Medicine

## 2017-08-14 MED ORDER — LOVASTATIN 20 MG PO TABS: 20.0000 mg | ORAL_TABLET | Freq: Every day | ORAL | 3 refills | Status: DC

## 2017-08-18 ENCOUNTER — Telehealth: Payer: Self-pay | Admitting: Family Medicine

## 2017-08-18 NOTE — Telephone Encounter (Signed)
Attempted to call patient to see how many pills she has left- no answer.  Left message will forward call for alternative Rx.

## 2017-08-18 NOTE — Telephone Encounter (Signed)
Copied from [place] [id number]. Topic: Quick Communication - Rx Refill/Question >> Aug 18, 2017 11:01 AM Arletha Grippe wrote: Medication: losartan  - is on recall - got a letter from her pharm    Has the patient contacted their pharmacy? Yes.     (Agent: If no, request that the patient contact the pharmacy for the refill.)   Preferred Pharmacy (with phone number or street name): [facility] [address]    Agent: Please be advised that RX refills may take up to 3 business days. We ask that you follow-up with your pharmacy.

## 2017-08-19 ENCOUNTER — Ambulatory Visit (INDEPENDENT_AMBULATORY_CARE_PROVIDER_SITE_OTHER): Payer: Medicare Other | Admitting: Women's Health

## 2017-08-19 ENCOUNTER — Encounter: Payer: Self-pay | Admitting: Women's Health

## 2017-08-19 VITALS — BP 136/82

## 2017-08-19 DIAGNOSIS — Z01419 Encounter for gynecological examination (general) (routine) without abnormal findings: Secondary | ICD-10-CM

## 2017-08-19 NOTE — Progress Notes (Signed)
Virginia Romero January 15, 1956 [id number]    History:    Presents for annual exam.  Postmenopausal on no HRT with no bleeding.  Biggest problem is depression with PTSD.  Primary care manages hypertension, diabetes and hypothyroidism.  Currently in counseling also.  2015 benign endometrial polyp.  Normal Pap and mammogram history last mammogram in chart 2016 at [facility] but feels she has had one more recently.  2018 T score -1.5 right femoral neck FRAX 7.5% / 1%.  2016 right shoulder fracture from traumatic fall on cement.  Not sexually active in many years.  Past medical history, past surgical history, family history and social history were all reviewed and documented in the EPIC chart.  2 children daughter lives local son [place] Romero  lives in [place] who is very helpful.  Had 2 foster dogs that she adopted both doing well now.  Cared for her ex-husband before he died of AIDS.  ROS:  A ROS was performed and pertinent positives and negatives are included.  Exam:  Vitals:   08/19/17 1009  BP: 136/82   There is no height or weight on file to calculate BMI.   General appearance:  Normal Thyroid:  Symmetrical, normal in size, without palpable masses or nodularity. Respiratory  Auscultation:  Clear without wheezing or rhonchi Cardiovascular  Auscultation:  Regular rate, without rubs, murmurs or gallops  Edema/varicosities:  Not grossly evident Abdominal  Soft,nontender, without masses, guarding or rebound.  Liver/spleen:  No organomegaly noted  Hernia:  None appreciated  Skin  Inspection:  Grossly normal   Breasts: Examined lying and sitting.     Right: Without masses, retractions, discharge or axillary adenopathy.     Left: Without masses, retractions, discharge or axillary adenopathy. Gentitourinary   Inguinal/mons:  Normal without inguinal adenopathy  External genitalia:  Normal  BUS/Urethra/Skene's glands:  Normal  Vagina:  Normal  Cervix:  Normal  Uterus:   normal in size, shape and  contour.  Midline and mobile  Adnexa/parametria:     Rt: Without masses or tenderness.   Lt: Without masses or tenderness.  Anus and perineum: Normal  Digital rectal exam: Normal sphincter tone without palpated masses or tenderness  Assessment/Plan:  62 y.o. [employer] F G2 P2 for breast and pelvic exam.  Postmenopausal/no HRT/no bleeding. Depression with PTSD- therapy and medication per primary care Hypertension/diabetes/hypothyroidism-primary care manages labs and meds Osteopenia without elevated FRAX Obesity Smoker  Plan: Aware of hazards of smoking, reviewed importance of decreasing.  SBE's, will check with [facility] if overdue will schedule and have report sent to our office.  Reviewed importance of increasing regular weightbearing exercise, home safety and fall prevention discussed.  Vitamin D 2000 daily encouraged.  Recently had a negative Cologard, reviewed colonoscopy better screening test but states it is cost prohibitive.  Reviewed importance of increasing social engagements, meeting friends for lunch, leisure activities encouraged.  Pap normal 2016, the screening guidelines reviewed.   [place], 10:54 AM 08/19/2017

## 2017-08-19 NOTE — Patient Instructions (Addendum)
Shingrex  Vaccine  Shingles prevention    Health Maintenance for Postmenopausal Women Menopause is a normal process in which your reproductive ability comes to an end. This process happens gradually over a span of months to years, usually between the ages of 38 and 33. Menopause is complete when you have missed 12 consecutive menstrual periods. It is important to talk with your health care provider about some of the most common conditions that affect postmenopausal women, such as heart disease, cancer, and bone loss (osteoporosis). Adopting a healthy lifestyle and getting preventive care can help to promote your health and wellness. Those actions can also lower your chances of developing some of these common conditions. What should I know about menopause? During menopause, you may experience a number of symptoms, such as:  Moderate-to-severe hot flashes.  Night sweats.  Decrease in sex drive.  Mood swings.  Headaches.  Tiredness.  Irritability.  Memory problems.  Insomnia.  Choosing to treat or not to treat menopausal changes is an individual decision that you make with your health care provider. What should I know about hormone replacement therapy and supplements? Hormone therapy products are effective for treating symptoms that are associated with menopause, such as hot flashes and night sweats. Hormone replacement carries certain risks, especially as you become older. If you are thinking about using estrogen or estrogen with progestin treatments, discuss the benefits and risks with your health care provider. What should I know about heart disease and stroke? Heart disease, heart attack, and stroke become more likely as you age. This may be due, in part, to the hormonal changes that your body experiences during menopause. These can affect how your body processes dietary fats, triglycerides, and cholesterol. Heart attack and stroke are both medical emergencies. There are many things  that you can do to help prevent heart disease and stroke:  Have your blood pressure checked at least every 1-2 years. High blood pressure causes heart disease and increases the risk of stroke.  If you are 84-32 years old, ask your health care provider if you should take aspirin to prevent a heart attack or a stroke.  Do not use any tobacco products, including cigarettes, chewing tobacco, or electronic cigarettes. If you need help quitting, ask your health care provider.  It is important to eat a healthy diet and maintain a healthy weight. ? Be sure to include plenty of vegetables, fruits, low-fat dairy products, and lean protein. ? Avoid eating foods that are high in solid fats, added sugars, or salt (sodium).  Get regular exercise. This is one of the most important things that you can do for your health. ? Try to exercise for at least 150 minutes each week. The type of exercise that you do should increase your heart rate and make you sweat. This is known as moderate-intensity exercise. ? Try to do strengthening exercises at least twice each week. Do these in addition to the moderate-intensity exercise.  Know your numbers.Ask your health care provider to check your cholesterol and your blood glucose. Continue to have your blood tested as directed by your health care provider.  What should I know about cancer screening? There are several types of cancer. Take the following steps to reduce your risk and to catch any cancer development as early as possible. Breast Cancer  Practice breast self-awareness. ? This means understanding how your breasts normally appear and feel. ? It also means doing regular breast self-exams. Let your health care provider know about any changes,  no matter how small.  If you are 73 or older, have a clinician do a breast exam (clinical breast exam or CBE) every year. Depending on your age, family history, and medical history, it may be recommended that you also have  a yearly breast X-ray (mammogram).  If you have a family history of breast cancer, talk with your health care provider about genetic screening.  If you are at high risk for breast cancer, talk with your health care provider about having an MRI and a mammogram every year.  Breast cancer (BRCA) gene test is recommended for women who have family members with BRCA-related cancers. Results of the assessment will determine the need for genetic counseling and BRCA1 and for BRCA2 testing. BRCA-related cancers include these types: ? Breast. This occurs in males or females. ? Ovarian. ? Tubal. This may also be called fallopian tube cancer. ? Cancer of the abdominal or pelvic lining (peritoneal cancer). ? Prostate. ? Pancreatic.  Cervical, Uterine, and Ovarian Cancer Your health care provider may recommend that you be screened regularly for cancer of the pelvic organs. These include your ovaries, uterus, and vagina. This screening involves a pelvic exam, which includes checking for microscopic changes to the surface of your cervix (Pap test).  For women ages 21-65, health care providers may recommend a pelvic exam and a Pap test every three years. For women ages 81-65, they may recommend the Pap test and pelvic exam, combined with testing for human papilloma virus (HPV), every five years. Some types of HPV increase your risk of cervical cancer. Testing for HPV may also be done on women of any age who have unclear Pap test results.  Other health care providers may not recommend any screening for nonpregnant women who are considered low risk for pelvic cancer and have no symptoms. Ask your health care provider if a screening pelvic exam is right for you.  If you have had past treatment for cervical cancer or a condition that could lead to cancer, you need Pap tests and screening for cancer for at least 20 years after your treatment. If Pap tests have been discontinued for you, your risk factors (such as  having a new sexual partner) need to be reassessed to determine if you should start having screenings again. Some women have medical problems that increase the chance of getting cervical cancer. In these cases, your health care provider may recommend that you have screening and Pap tests more often.  If you have a family history of uterine cancer or ovarian cancer, talk with your health care provider about genetic screening.  If you have vaginal bleeding after reaching menopause, tell your health care provider.  There are currently no reliable tests available to screen for ovarian cancer.  Lung Cancer Lung cancer screening is recommended for adults 67-64 years old who are at high risk for lung cancer because of a history of smoking. A yearly low-dose CT scan of the lungs is recommended if you:  Currently smoke.  Have a history of at least 30 pack-years of smoking and you currently smoke or have quit within the past 15 years. A pack-year is smoking an average of one pack of cigarettes per day for one year.  Yearly screening should:  Continue until it has been 15 years since you quit.  Stop if you develop a health problem that would prevent you from having lung cancer treatment.  Colorectal Cancer  This type of cancer can be detected and can often be  prevented.  Routine colorectal cancer screening usually begins at age 108 and continues through age 85.  If you have risk factors for colon cancer, your health care provider may recommend that you be screened at an earlier age.  If you have a family history of colorectal cancer, talk with your health care provider about genetic screening.  Your health care provider may also recommend using home test kits to check for hidden blood in your stool.  A small camera at the end of a tube can be used to examine your colon directly (sigmoidoscopy or colonoscopy). This is done to check for the earliest forms of colorectal cancer.  Direct  examination of the colon should be repeated every 5-10 years until age 10. However, if early forms of precancerous polyps or small growths are found or if you have a family history or genetic risk for colorectal cancer, you may need to be screened more often.  Skin Cancer  Check your skin from head to toe regularly.  Monitor any moles. Be sure to tell your health care provider: ? About any new moles or changes in moles, especially if there is a change in a mole's shape or color. ? If you have a mole that is larger than the size of a pencil eraser.  If any of your family members has a history of skin cancer, especially at a Roisin Mones age, talk with your health care provider about genetic screening.  Always use sunscreen. Apply sunscreen liberally and repeatedly throughout the day.  Whenever you are outside, protect yourself by wearing long sleeves, pants, a wide-brimmed hat, and sunglasses.  What should I know about osteoporosis? Osteoporosis is a condition in which bone destruction happens more quickly than new bone creation. After menopause, you may be at an increased risk for osteoporosis. To help prevent osteoporosis or the bone fractures that can happen because of osteoporosis, the following is recommended:  If you are 36-50 years old, get at least 1,000 mg of calcium and at least 600 mg of vitamin D per day.  If you are older than age 7 but younger than age 29, get at least 1,200 mg of calcium and at least 600 mg of vitamin D per day.  If you are older than age 79, get at least 1,200 mg of calcium and at least 800 mg of vitamin D per day.  Smoking and excessive alcohol intake increase the risk of osteoporosis. Eat foods that are rich in calcium and vitamin D, and do weight-bearing exercises several times each week as directed by your health care provider. What should I know about how menopause affects my mental health? Depression may occur at any age, but it is more common as you become  older. Common symptoms of depression include:  Low or sad mood.  Changes in sleep patterns.  Changes in appetite or eating patterns.  Feeling an overall lack of motivation or enjoyment of activities that you previously enjoyed.  Frequent crying spells.  Talk with your health care provider if you think that you are experiencing depression. What should I know about immunizations? It is important that you get and maintain your immunizations. These include:  Tetanus, diphtheria, and pertussis (Tdap) booster vaccine.  Influenza every year before the flu season begins.  Pneumonia vaccine.  Shingles vaccine.  Your health care provider may also recommend other immunizations. This information is not intended to replace advice given to you by your health care provider. Make sure you discuss any questions you  have with your health care provider. Document Released: 07/12/2005 Document Revised: 12/08/2015 Document Reviewed: 02/21/2015 Elsevier Interactive Patient Education  2018 [employer].

## 2017-08-20 ENCOUNTER — Other Ambulatory Visit: Payer: Self-pay | Admitting: Family Medicine

## 2017-08-20 MED ORDER — IRBESARTAN 150 MG PO TABS: 150.0000 mg | ORAL_TABLET | Freq: Every day | ORAL | 5 refills | Status: DC

## 2017-08-20 NOTE — Telephone Encounter (Signed)
Spoke to pt and advised. F/u appt scheduled.

## 2017-08-20 NOTE — Telephone Encounter (Signed)
Please call patient and let her know that I have sent in a different blood pressure medicine that is related to the losartan she was on since it was recalled. Please follow up in 3 months.

## 2017-08-20 NOTE — Telephone Encounter (Signed)
Spoke to pharmacy who confirmed pt was given Rx 06/2017. Rx d/c 08/19/17

## 2017-08-20 NOTE — Telephone Encounter (Signed)
I do not see losartan on her medication list, please call pharmacy and verify if she has any recent fills of losartan and dose.

## 2017-08-21 NOTE — Addendum Note (Signed)
Addended by: Alen Blew on: 08/21/2017 10:58 AM   Modules accepted: Level of Service

## 2017-09-01 DIAGNOSIS — H5213 Myopia, bilateral: Secondary | ICD-10-CM | POA: Diagnosis not present

## 2017-09-01 DIAGNOSIS — H2513 Age-related nuclear cataract, bilateral: Secondary | ICD-10-CM | POA: Diagnosis not present

## 2017-09-01 DIAGNOSIS — H43812 Vitreous degeneration, left eye: Secondary | ICD-10-CM | POA: Diagnosis not present

## 2017-09-01 DIAGNOSIS — E113291 Type 2 diabetes mellitus with mild nonproliferative diabetic retinopathy without macular edema, right eye: Secondary | ICD-10-CM | POA: Diagnosis not present

## 2017-09-02 ENCOUNTER — Ambulatory Visit (INDEPENDENT_AMBULATORY_CARE_PROVIDER_SITE_OTHER): Payer: Medicare Other | Admitting: Licensed Clinical Social Worker

## 2017-09-02 ENCOUNTER — Encounter (HOSPITAL_COMMUNITY): Payer: Self-pay | Admitting: Licensed Clinical Social Worker

## 2017-09-02 DIAGNOSIS — F4310 Post-traumatic stress disorder, unspecified: Secondary | ICD-10-CM | POA: Diagnosis not present

## 2017-09-02 DIAGNOSIS — F331 Major depressive disorder, recurrent, moderate: Secondary | ICD-10-CM | POA: Diagnosis not present

## 2017-09-02 NOTE — Progress Notes (Signed)
THERAPIST PROGRESS NOTE  Session Time: 9:00am-10:00am  Participation Level: Active  Behavioral Response: Well GroomedAlertDepressed   Type of Therapy: Individual Therapy  Treatment Goals addressed: Improve psychiatric symptoms, elevate mood and functioning, Improve unhelpful thought patterns, Stress management  Interventions: CBT and Motivational Interviewing, grounding and mindfulness techniques, psychoeducation  Summary: Virginia Romero is a 62 y.o. female who presents with Major Depressive Disorder, recurrent, moderate   Suicidal/Homicidal: No - without intent/plan  Therapist Response: Virginia Romero met with clinician for an individual session. Virginia Romero discussed her psychiatric symptoms, her current life events, and her homework. Virginia Romero shared that she has had it rough the past few weeks. Virginia Romero identified relational problems with daughter, who has restricted contact with granddaughter. Clinician utilized MI OARS to reflect and summarize thoughts and feelings. Clinician identified appropriate sadness and concern about this, as well as identified Virginia Romero's lack of ability to influence or control her daughter. Clinician explored hx of interaction with daughter and noted problems since daughter was 8. Clinician discussed Love Languages and noted that daughter receives love in gifts, which financially is not something that Virginia Romero can provide. Clinician offered alternative that quality time, words of affirmation, and physical affection are available from Virginia Romero if her daughter chooses to accept them. Clinician explored relationship with son and noted concerns from others about son's sexuality. Virginia Romero reports she has no problem with her son being gay, although he has not officially come out to her.  Clinician validated Virginia Romero position, but confronted her on her own core belief that she is a "F up".   Plan: Return again in 2-3 weeks.  Diagnosis:     Axis I: Major Depressive Disorder, recurrent,  moderate  Jessica R Schlosberg, LCSW 09/02/2017  

## 2017-10-01 ENCOUNTER — Ambulatory Visit (HOSPITAL_COMMUNITY): Payer: Medicare Other | Admitting: Licensed Clinical Social Worker

## 2017-10-30 ENCOUNTER — Other Ambulatory Visit: Payer: Self-pay | Admitting: Primary Care

## 2017-11-04 ENCOUNTER — Other Ambulatory Visit: Payer: Self-pay | Admitting: Family Medicine

## 2017-11-04 DIAGNOSIS — E1169 Type 2 diabetes mellitus with other specified complication: Secondary | ICD-10-CM

## 2017-11-04 DIAGNOSIS — E785 Hyperlipidemia, unspecified: Principal | ICD-10-CM

## 2017-11-06 ENCOUNTER — Other Ambulatory Visit: Payer: Self-pay | Admitting: Family Medicine

## 2017-11-06 ENCOUNTER — Ambulatory Visit (HOSPITAL_COMMUNITY): Payer: Medicare Other | Admitting: Licensed Clinical Social Worker

## 2017-11-06 DIAGNOSIS — F5104 Psychophysiologic insomnia: Secondary | ICD-10-CM

## 2017-11-06 DIAGNOSIS — F32A Depression, unspecified: Secondary | ICD-10-CM

## 2017-11-06 DIAGNOSIS — F419 Anxiety disorder, unspecified: Principal | ICD-10-CM

## 2017-11-06 DIAGNOSIS — F329 Major depressive disorder, single episode, unspecified: Secondary | ICD-10-CM

## 2017-11-06 NOTE — Telephone Encounter (Signed)
Last filled 08/04/17 # 60 refills 2  Last OV 08/19/17

## 2017-11-24 ENCOUNTER — Ambulatory Visit: Payer: Medicare Other | Admitting: Family Medicine

## 2017-12-03 ENCOUNTER — Ambulatory Visit (HOSPITAL_COMMUNITY): Payer: Medicare Other | Admitting: Licensed Clinical Social Worker

## 2017-12-12 ENCOUNTER — Ambulatory Visit (INDEPENDENT_AMBULATORY_CARE_PROVIDER_SITE_OTHER): Payer: Medicare Other | Admitting: Family Medicine

## 2017-12-12 ENCOUNTER — Encounter: Payer: Self-pay | Admitting: Family Medicine

## 2017-12-12 VITALS — BP 158/86 | HR 62 | Temp 98.4°F | Ht 61.5 in | Wt 203.0 lb

## 2017-12-12 DIAGNOSIS — F329 Major depressive disorder, single episode, unspecified: Secondary | ICD-10-CM | POA: Diagnosis not present

## 2017-12-12 DIAGNOSIS — G8929 Other chronic pain: Secondary | ICD-10-CM

## 2017-12-12 DIAGNOSIS — E785 Hyperlipidemia, unspecified: Secondary | ICD-10-CM

## 2017-12-12 DIAGNOSIS — I10 Essential (primary) hypertension: Secondary | ICD-10-CM | POA: Diagnosis not present

## 2017-12-12 DIAGNOSIS — M25512 Pain in left shoulder: Secondary | ICD-10-CM | POA: Diagnosis not present

## 2017-12-12 DIAGNOSIS — E1169 Type 2 diabetes mellitus with other specified complication: Secondary | ICD-10-CM | POA: Diagnosis not present

## 2017-12-12 DIAGNOSIS — F419 Anxiety disorder, unspecified: Secondary | ICD-10-CM

## 2017-12-12 DIAGNOSIS — F32A Depression, unspecified: Secondary | ICD-10-CM

## 2017-12-12 MED ORDER — MELOXICAM 15 MG PO TABS: 15.0000 mg | ORAL_TABLET | Freq: Every day | ORAL | 1 refills | Status: DC | PRN
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
## 2017-12-15 ENCOUNTER — Encounter: Payer: Self-pay | Admitting: Family Medicine

## 2017-12-18 ENCOUNTER — Other Ambulatory Visit: Payer: Self-pay | Admitting: Family Medicine

## 2017-12-18 DIAGNOSIS — F329 Major depressive disorder, single episode, unspecified: Secondary | ICD-10-CM

## 2017-12-18 DIAGNOSIS — F419 Anxiety disorder, unspecified: Principal | ICD-10-CM

## 2017-12-18 DIAGNOSIS — F32A Depression, unspecified: Secondary | ICD-10-CM

## 2017-12-22 ENCOUNTER — Telehealth: Payer: Self-pay | Admitting: Family Medicine

## 2017-12-22 ENCOUNTER — Other Ambulatory Visit: Payer: Self-pay | Admitting: Family Medicine

## 2017-12-22 ENCOUNTER — Encounter: Payer: Self-pay | Admitting: Family Medicine

## 2017-12-22 DIAGNOSIS — F32A Depression, unspecified: Secondary | ICD-10-CM

## 2017-12-22 DIAGNOSIS — F329 Major depressive disorder, single episode, unspecified: Secondary | ICD-10-CM

## 2017-12-22 DIAGNOSIS — F419 Anxiety disorder, unspecified: Principal | ICD-10-CM

## 2017-12-22 NOTE — Telephone Encounter (Signed)
Please call pharmacy and see if she has had her irbesartan filled recently. If not, ask if her prescription is still active. If not please send in irbesartan 150 po qd. If it doesn't look like she has been taking it, please call her I want her to start the additional blood pressure medicine. Also, see if she is doing ok after falling on Sunday.

## 2017-12-25 ENCOUNTER — Ambulatory Visit (INDEPENDENT_AMBULATORY_CARE_PROVIDER_SITE_OTHER): Payer: Medicare Other | Admitting: Licensed Clinical Social Worker

## 2017-12-25 ENCOUNTER — Encounter (HOSPITAL_COMMUNITY): Payer: Self-pay | Admitting: Licensed Clinical Social Worker

## 2017-12-25 DIAGNOSIS — F331 Major depressive disorder, recurrent, moderate: Secondary | ICD-10-CM | POA: Diagnosis not present
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
## 2017-12-26 ENCOUNTER — Telehealth: Payer: Self-pay

## 2017-12-26 ENCOUNTER — Other Ambulatory Visit: Payer: Self-pay | Admitting: Emergency Medicine

## 2017-12-26 ENCOUNTER — Other Ambulatory Visit: Payer: Self-pay | Admitting: Family Medicine

## 2017-12-26 DIAGNOSIS — I10 Essential (primary) hypertension: Secondary | ICD-10-CM

## 2017-12-26 MED ORDER — LOSARTAN POTASSIUM 50 MG PO TABS: 50.0000 mg | ORAL_TABLET | Freq: Every day | ORAL | 3 refills | Status: DC

## 2017-12-26 MED ORDER — IRBESARTAN 150 MG PO TABS: 150.0000 mg | ORAL_TABLET | Freq: Every day | ORAL | 5 refills | Status: DC

## 2017-12-26 NOTE — Telephone Encounter (Signed)
I spoke with pt;pt is going to call ins now to see what med is approved by ins co to substitute for irbesartan 150 mg. Pt will cb with info.

## 2017-12-26 NOTE — Telephone Encounter (Signed)
Patient called and states that she called Medicare about Irbesartan and they don't know the generic for this. She states that Medicare has to re access her so she can get her medication for free based on her income. She is wondering if a cheaper rx or a different RX can be sent in until they re access her income. She is requesting a 30- 60 day supply . Please call patient CB# [phone]

## 2017-12-26 NOTE — Telephone Encounter (Signed)
Called and spoke to Pharmacy patient has not picked up prescription. Pharmacy states that it will be 82 dollars. Sent in new prescription called and informed patient that you like for her to restart medication.

## 2017-12-26 NOTE — Telephone Encounter (Signed)
See other thread for resolution.

## 2017-12-26 NOTE — Telephone Encounter (Signed)
Copied from [place] [id number]. Topic: General - Other >> Dec 26, 2017  1:35 PM Keene Breath wrote: Reason for CRM: Patient called to request a different BP medicine that is less expensive.  The pharmacy informed the patient that the irbesartan (AVAPRO) 150 MG tablet medicine would cost $82 for a 30 day supply.  Patient stated she cannot afford this . Please advise.  CB# [phone].

## 2017-12-26 NOTE — Telephone Encounter (Signed)
New prescription for losartan sent to patient's pharmacy.

## 2017-12-26 NOTE — Telephone Encounter (Signed)
Called and informed patient that Jackelyn Poling will be sending in a medication that is on the 4 Dollar list at [facility]. Understanding verbalized nothing further needed at this time.

## 2017-12-28 ENCOUNTER — Other Ambulatory Visit: Payer: Self-pay | Admitting: Family Medicine

## 2017-12-28 DIAGNOSIS — F32A Depression, unspecified: Secondary | ICD-10-CM

## 2017-12-28 DIAGNOSIS — F419 Anxiety disorder, unspecified: Principal | ICD-10-CM

## 2017-12-28 DIAGNOSIS — F5104 Psychophysiologic insomnia: Secondary | ICD-10-CM

## 2017-12-28 DIAGNOSIS — F329 Major depressive disorder, single episode, unspecified: Secondary | ICD-10-CM

## 2017-12-28 MED ORDER — ALPRAZOLAM 1 MG PO TABS: 1.0000 mg | ORAL_TABLET | Freq: Three times a day (TID) | ORAL | 0 refills | Status: DC | PRN

## 2017-12-29 MED ORDER — DULOXETINE HCL 30 MG PO CPEP: 30.0000 mg | ORAL_CAPSULE | Freq: Every day | ORAL | 0 refills | Status: DC

## 2018-01-19 ENCOUNTER — Other Ambulatory Visit: Payer: Self-pay | Admitting: Family Medicine

## 2018-01-19 ENCOUNTER — Ambulatory Visit (HOSPITAL_COMMUNITY): Payer: Self-pay | Admitting: Licensed Clinical Social Worker

## 2018-01-22 ENCOUNTER — Encounter: Payer: Self-pay | Admitting: Women's Health

## 2018-01-23 ENCOUNTER — Other Ambulatory Visit: Payer: Self-pay | Admitting: Family Medicine

## 2018-01-23 DIAGNOSIS — F5104 Psychophysiologic insomnia: Secondary | ICD-10-CM

## 2018-01-23 DIAGNOSIS — F419 Anxiety disorder, unspecified: Principal | ICD-10-CM

## 2018-01-23 DIAGNOSIS — F32A Depression, unspecified: Secondary | ICD-10-CM

## 2018-01-23 DIAGNOSIS — F329 Major depressive disorder, single episode, unspecified: Secondary | ICD-10-CM

## 2018-01-26 ENCOUNTER — Other Ambulatory Visit: Payer: Self-pay | Admitting: Family Medicine

## 2018-01-26 DIAGNOSIS — E034 Atrophy of thyroid (acquired): Secondary | ICD-10-CM

## 2018-01-26 MED ORDER — LEVOTHYROXINE SODIUM 200 MCG PO TABS: ORAL_TABLET | ORAL | 5 refills | Status: DC

## 2018-01-26 NOTE — Telephone Encounter (Signed)
Please advise 

## 2018-01-26 NOTE — Telephone Encounter (Signed)
Pt wants to make sure that the following medications are refilled:  ALPRAZolam (XANAX) 1 MG tablet levothyroxine (SYNTHROID, LEVOTHROID) 200 MCG tablet hydrochlorothiazide (HYDRODIURIL) 12.5 MG tablet metoprolol tartrate (LOPRESSOR) 100 MG tablet

## 2018-01-27 ENCOUNTER — Other Ambulatory Visit: Payer: Self-pay | Admitting: Family Medicine

## 2018-01-28 ENCOUNTER — Other Ambulatory Visit: Payer: Self-pay | Admitting: Emergency Medicine

## 2018-01-28 ENCOUNTER — Ambulatory Visit (HOSPITAL_COMMUNITY): Payer: Self-pay | Admitting: Licensed Clinical Social Worker

## 2018-01-28 ENCOUNTER — Telehealth: Payer: Self-pay | Admitting: Emergency Medicine

## 2018-01-28 DIAGNOSIS — E034 Atrophy of thyroid (acquired): Secondary | ICD-10-CM

## 2018-01-28 MED ORDER — LEVOTHYROXINE SODIUM 200 MCG PO TABS: ORAL_TABLET | ORAL | 5 refills | Status: DC

## 2018-01-28 NOTE — Telephone Encounter (Signed)
Resent synthroid  Please advise on the xanax    Copied from [place] [phone]. Topic: Quick Communication - See Telephone Encounter >> Jan 28, 2018  2:34 PM Antonieta Iba C wrote: CRM for notification. See Telephone encounter for: 01/28/18.  Pt called in to check the status of her refill request for 2 medications. Advised that it appears as though it was sent in on 01/26/18, pt says that pharmacy is stating that they have not received. Please advise.   1. levothyroxine (SYNTHROID, LEVOTHROID) 200 MCG tablet   2. ALPRAZolam (XANAX) 1 MG tablet

## 2018-01-28 NOTE — Telephone Encounter (Signed)
Please check with pharmacy. Alprazolam RX says received by pharmacy.

## 2018-01-30 NOTE — Telephone Encounter (Signed)
Called and spoke with pharmacy Alprazolam was received and I resent and gave verbal for the synthroid. Nothing  further needed.

## 2018-02-16 ENCOUNTER — Ambulatory Visit (INDEPENDENT_AMBULATORY_CARE_PROVIDER_SITE_OTHER): Payer: Medicare Other | Admitting: Licensed Clinical Social Worker

## 2018-02-16 ENCOUNTER — Encounter (HOSPITAL_COMMUNITY): Payer: Self-pay | Admitting: Licensed Clinical Social Worker

## 2018-02-16 DIAGNOSIS — F331 Major depressive disorder, recurrent, moderate: Secondary | ICD-10-CM

## 2018-02-16 NOTE — Progress Notes (Signed)
   THERAPIST PROGRESS NOTE  Session Time: 8:00am-8:55am  Participation Level: Active  Behavioral Response: Well GroomedAlertDepressed  Type of Therapy: Individual Therapy  Treatment Goals addressed: Improve psychiatric symptoms, elevate mood and functioning, Improve unhelpful thought patterns, Stress management  Interventions: CBT and Motivational Interviewing, grounding and mindfulness techniques, psychoeducation  Summary: Chelcey Caputo is a 62 y.o. female who presents with Major Depressive Disorder, recurrent, moderate   Suicidal/Homicidal: No - without intent/plan  Therapist Response: Manuela Schwartz met with clinician for an individual session. Carmelita discussed her psychiatric symptoms, her current life events, and her homework. Vannessa shared ongoing depression and lack of motivation to get up over the weekend. Kyara processed health concerns for her best friend of 32 years, who has colon cancer and blockages in carotid arteries. Clinician processed these concerns and provided psychoeducation about what is happening to her friend. Saliyah also identified a health scare for herself where her blood pressure got up to 267/180 and she coughed up a lot of blood. Clinician explored completing a Living Will. Clinician provided the [facility] Will document from the website and encouraged Rocky to look over and complete, for future reference and to ensure that her wishes would be carried out. Clinician also encouraged Margrett to have the realistic and uncomfortable conversation with her children about her wishes.   Plan: Return again in 2-3 weeks.  Diagnosis:     Axis I: Major Depressive Disorder, recurrent, moderate  Mindi Curling, LCSW 02/16/2018

## 2018-02-23 ENCOUNTER — Other Ambulatory Visit: Payer: Self-pay | Admitting: Family Medicine

## 2018-02-23 DIAGNOSIS — F32A Depression, unspecified: Secondary | ICD-10-CM

## 2018-02-23 DIAGNOSIS — E1169 Type 2 diabetes mellitus with other specified complication: Secondary | ICD-10-CM

## 2018-02-23 DIAGNOSIS — F5104 Psychophysiologic insomnia: Secondary | ICD-10-CM

## 2018-02-23 DIAGNOSIS — F329 Major depressive disorder, single episode, unspecified: Secondary | ICD-10-CM

## 2018-02-23 DIAGNOSIS — E785 Hyperlipidemia, unspecified: Principal | ICD-10-CM

## 2018-02-23 DIAGNOSIS — F419 Anxiety disorder, unspecified: Principal | ICD-10-CM

## 2018-02-23 NOTE — Telephone Encounter (Signed)
Last filled 01/26/18 #90 refills 0  Last OV 02/16/18

## 2018-03-23 ENCOUNTER — Other Ambulatory Visit: Payer: Self-pay | Admitting: Family Medicine

## 2018-03-23 DIAGNOSIS — F32A Depression, unspecified: Secondary | ICD-10-CM

## 2018-03-23 DIAGNOSIS — F419 Anxiety disorder, unspecified: Principal | ICD-10-CM

## 2018-03-23 DIAGNOSIS — F5104 Psychophysiologic insomnia: Secondary | ICD-10-CM

## 2018-03-23 DIAGNOSIS — F329 Major depressive disorder, single episode, unspecified: Secondary | ICD-10-CM

## 2018-03-23 NOTE — Telephone Encounter (Signed)
Last filled 02/24/18 #90 refills 0 Last OV 12/28/17

## 2018-03-24 ENCOUNTER — Ambulatory Visit (INDEPENDENT_AMBULATORY_CARE_PROVIDER_SITE_OTHER): Payer: Medicare Other | Admitting: Licensed Clinical Social Worker

## 2018-03-24 ENCOUNTER — Encounter (HOSPITAL_COMMUNITY): Payer: Self-pay | Admitting: Licensed Clinical Social Worker

## 2018-03-24 DIAGNOSIS — F332 Major depressive disorder, recurrent severe without psychotic features: Secondary | ICD-10-CM | POA: Diagnosis not present
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
## 2018-04-07 ENCOUNTER — Other Ambulatory Visit: Payer: Self-pay | Admitting: Primary Care

## 2018-04-07 DIAGNOSIS — F329 Major depressive disorder, single episode, unspecified: Secondary | ICD-10-CM

## 2018-04-07 DIAGNOSIS — F419 Anxiety disorder, unspecified: Principal | ICD-10-CM

## 2018-04-07 DIAGNOSIS — F32A Depression, unspecified: Secondary | ICD-10-CM

## 2018-04-07 NOTE — Telephone Encounter (Signed)
This was first prescribed by Allie Bossier.  Last office visit on 12/12/2017

## 2018-04-14 ENCOUNTER — Encounter: Payer: Self-pay | Admitting: Women's Health

## 2018-04-14 DIAGNOSIS — Z1231 Encounter for screening mammogram for malignant neoplasm of breast: Secondary | ICD-10-CM | POA: Diagnosis not present

## 2018-04-20 ENCOUNTER — Encounter (HOSPITAL_COMMUNITY): Payer: Self-pay | Admitting: Licensed Clinical Social Worker

## 2018-04-20 ENCOUNTER — Other Ambulatory Visit: Payer: Self-pay | Admitting: Family Medicine

## 2018-04-20 ENCOUNTER — Ambulatory Visit (INDEPENDENT_AMBULATORY_CARE_PROVIDER_SITE_OTHER): Payer: Medicare Other | Admitting: Licensed Clinical Social Worker

## 2018-04-20 DIAGNOSIS — F419 Anxiety disorder, unspecified: Principal | ICD-10-CM

## 2018-04-20 DIAGNOSIS — F329 Major depressive disorder, single episode, unspecified: Secondary | ICD-10-CM

## 2018-04-20 DIAGNOSIS — F331 Major depressive disorder, recurrent, moderate: Secondary | ICD-10-CM | POA: Diagnosis not present

## 2018-04-20 DIAGNOSIS — F32A Depression, unspecified: Secondary | ICD-10-CM

## 2018-04-20 DIAGNOSIS — F5104 Psychophysiologic insomnia: Secondary | ICD-10-CM

## 2018-04-20 NOTE — Progress Notes (Signed)
THERAPIST PROGRESS NOTE  Session Time: 8:00am-8:45am  Participation Level: Active  Behavioral Response: CasualAlertDepressed  Type of Therapy: Individual Therapy  Treatment Goals addressed: Improve psychiatric symptoms, elevate mood and functioning, Improve unhelpful thought patterns, Stress management  Interventions: CBT and Motivational Interviewing, grounding and mindfulness techniques, psychoeducation  Summary: Virginia Romero is a 62 y.o. female who presents with Major Depressive Disorder, recurrent, moderate   Suicidal/Homicidal: No - without intent/plan  Therapist Response: Myra met with clinician for an individual session. Yazleen discussed her psychiatric symptoms, her current life events, and her homework. Shannon shared that she has been using the method of "wrapping herself in saran wrap" when listening to the problems of others. She reports this has been useful because "all I have to do is listen, I don't have to feel everything". Clinician explored mood and health concerns. Eleena reports having significant episodes of sweating, especially when she feels anxious. Clinician explored other body sensations and noted some racing heartbeats and a little tightness in her chest. Clinician discussed the importance of controlled breathing during these episodes and encouraged her to practice before, during and after an event where she may feel anxious.  Mio continues to mourn the recent loss of her brother and notes feeling very lonely. She reports she only has a few close friends and her children as supports. Now she feels a lot of concern for her sister-in-law, who has moved in with her niece for assistance.  Clinician explored any positive events that have occurred. Corinn identified that her ex-sister-in-law came to visit and had brought her a hand-knitted full sized quilt that was made for her. Clinician encouraged Jakira to focus her attention on the feeling of literally being wrapped in  a quilt made of love.   Plan: Return again in 2-3 weeks.  Diagnosis:     Axis I: Major Depressive Disorder, recurrent, moderate   Jessica R Schlosberg, LCSW 04/20/2018  

## 2018-04-20 NOTE — Telephone Encounter (Signed)
Last filled 03/24/18 #90 refills 0  Last OV 12/12/17  Future appointment scheduled for 06/15/18

## 2018-05-18 ENCOUNTER — Other Ambulatory Visit: Payer: Self-pay | Admitting: Family Medicine

## 2018-05-18 DIAGNOSIS — F32A Depression, unspecified: Secondary | ICD-10-CM

## 2018-05-18 DIAGNOSIS — F419 Anxiety disorder, unspecified: Principal | ICD-10-CM

## 2018-05-18 DIAGNOSIS — F5104 Psychophysiologic insomnia: Secondary | ICD-10-CM

## 2018-05-18 DIAGNOSIS — F329 Major depressive disorder, single episode, unspecified: Secondary | ICD-10-CM

## 2018-05-18 NOTE — Telephone Encounter (Signed)
Last filled 04/20/18 #90 refills 0  Last OV 12/12/17

## 2018-05-20 ENCOUNTER — Telehealth: Payer: Self-pay

## 2018-05-20 NOTE — Telephone Encounter (Signed)
Pt called to see if she would be OK to wait  Until her appt in Jan to get flu shot. I advised pt I could not say she would not get the flu prior to Jan appt; I have no way of knowing but offered pt an appt for flu shot clinic at [facility] on 05/21/18. Pt said no she cannot come that far. Pt said she has a Product/process development scientist 3 mins from her house could she get flu shot there. I advised pt that was her choice and she said that she was going to [place].

## 2018-05-21 ENCOUNTER — Ambulatory Visit (HOSPITAL_COMMUNITY): Payer: Medicare Other | Admitting: Licensed Clinical Social Worker

## 2018-05-21 DIAGNOSIS — Z23 Encounter for immunization: Secondary | ICD-10-CM | POA: Diagnosis not present

## 2018-05-21 NOTE — Telephone Encounter (Signed)
Please Advise

## 2018-05-21 NOTE — Telephone Encounter (Signed)
Pt contacted office regarding status of refill. Advised of refill policy which allows [id number].

## 2018-06-10 ENCOUNTER — Other Ambulatory Visit: Payer: Self-pay | Admitting: Family Medicine

## 2018-06-15 ENCOUNTER — Ambulatory Visit (INDEPENDENT_AMBULATORY_CARE_PROVIDER_SITE_OTHER): Payer: Medicare Other | Admitting: Family Medicine

## 2018-06-15 ENCOUNTER — Encounter: Payer: Self-pay | Admitting: Family Medicine

## 2018-06-15 VITALS — BP 190/80 | HR 60 | Temp 98.6°F | Ht 61.5 in | Wt 214.0 lb

## 2018-06-15 DIAGNOSIS — F419 Anxiety disorder, unspecified: Secondary | ICD-10-CM

## 2018-06-15 DIAGNOSIS — E785 Hyperlipidemia, unspecified: Secondary | ICD-10-CM

## 2018-06-15 DIAGNOSIS — F329 Major depressive disorder, single episode, unspecified: Secondary | ICD-10-CM | POA: Diagnosis not present

## 2018-06-15 DIAGNOSIS — E1169 Type 2 diabetes mellitus with other specified complication: Secondary | ICD-10-CM

## 2018-06-15 DIAGNOSIS — I10 Essential (primary) hypertension: Secondary | ICD-10-CM

## 2018-06-15 DIAGNOSIS — F32A Depression, unspecified: Secondary | ICD-10-CM

## 2018-06-15 DIAGNOSIS — E034 Atrophy of thyroid (acquired): Secondary | ICD-10-CM

## 2018-06-15 LAB — COMPREHENSIVE METABOLIC PANEL
ALBUMIN: 4 g/dL (ref 3.5–5.2)
ALT: 12 U/L (ref 0–35)
AST: 13 U/L (ref 0–37)
Alkaline Phosphatase: 117 U/L (ref 39–117)
BUN: 8 mg/dL (ref 6–23)
CO2: 29 mEq/L (ref 19–32)
Calcium: 9.3 mg/dL (ref 8.4–10.5)
Chloride: 100 mEq/L (ref 96–112)
Creatinine, Ser: 0.86 mg/dL (ref 0.40–1.20)
GFR: 71.1 mL/min (ref >60.00)
Glucose, Bld: 85 mg/dL (ref 70–99)
Potassium: 4.1 mEq/L (ref 3.5–5.1)
Sodium: 139 mEq/L (ref 135–145)
Total Bilirubin: 0.2 mg/dL (ref 0.2–1.2)
Total Protein: 7.1 g/dL (ref 6.0–8.3)

## 2018-06-15 LAB — MICROALBUMIN / CREATININE URINE RATIO
Creatinine,U: 142.6 mg/dL
Microalb Creat Ratio: 1.4 mg/g (ref 0.0–30.0)
Microalb, Ur: 2 mg/dL — ABNORMAL HIGH (ref 0.0–1.9)

## 2018-06-15 LAB — LIPID PANEL
CHOLESTEROL: 176 mg/dL (ref 0–200)
HDL: 32.9 mg/dL — ABNORMAL LOW (ref >39.00)
NonHDL: 143.43
Total CHOL/HDL Ratio: 5
Triglycerides: 314 mg/dL — ABNORMAL HIGH (ref 0.0–149.0)
VLDL: 62.8 mg/dL — ABNORMAL HIGH (ref 0.0–40.0)

## 2018-06-15 LAB — TSH: TSH: 0.19 u[IU]/mL — AB (ref 0.35–4.50)

## 2018-06-15 LAB — HEMOGLOBIN A1C: Hgb A1c MFr Bld: 6.8 % — ABNORMAL HIGH (ref 4.6–6.5)

## 2018-06-15 LAB — LDL CHOLESTEROL, DIRECT: Direct LDL: 116 mg/dL

## 2018-06-15 MED ORDER — LOSARTAN POTASSIUM 100 MG PO TABS: 100.0000 mg | ORAL_TABLET | Freq: Every day | ORAL | 3 refills | Status: DC

## 2018-06-15 MED ORDER — DULOXETINE HCL 60 MG PO CPEP: 60.0000 mg | ORAL_CAPSULE | Freq: Every day | ORAL | 3 refills | Status: DC

## 2018-06-15 NOTE — Patient Instructions (Signed)
So good to see you today  Please schedule a follow up for 4-6 weeks to check your blood pressure and your mood  Increase your Cymbalta to 60 mg a day (2 tablets) until you run out, I have sent in a new prescription for the 60 mg dose  Increase your losartan to 2 tablets (100 mg) daily, I have sent in a new prescription for the 100 mg dose  We have called your pharmacy and asked that your alprazolam be filled. If you have any problems with this, notify me immediately
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
## 2018-06-16 ENCOUNTER — Other Ambulatory Visit: Payer: Self-pay | Admitting: Family Medicine

## 2018-06-16 ENCOUNTER — Encounter: Payer: Self-pay | Admitting: Family Medicine

## 2018-06-16 DIAGNOSIS — E785 Hyperlipidemia, unspecified: Principal | ICD-10-CM

## 2018-06-16 DIAGNOSIS — E1169 Type 2 diabetes mellitus with other specified complication: Secondary | ICD-10-CM

## 2018-06-18 MED ORDER — LEVOTHYROXINE SODIUM 175 MCG PO TABS: ORAL_TABLET | ORAL | 3 refills | Status: DC

## 2018-06-18 NOTE — Addendum Note (Signed)
Addended by: Clarene Reamer B on: 06/18/2018 10:20 PM   Modules accepted: Orders

## 2018-06-22 ENCOUNTER — Encounter (HOSPITAL_COMMUNITY): Payer: Self-pay | Admitting: Licensed Clinical Social Worker

## 2018-06-22 ENCOUNTER — Ambulatory Visit (INDEPENDENT_AMBULATORY_CARE_PROVIDER_SITE_OTHER): Payer: Medicare Other | Admitting: Licensed Clinical Social Worker

## 2018-06-22 DIAGNOSIS — F332 Major depressive disorder, recurrent severe without psychotic features: Secondary | ICD-10-CM
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
## 2018-07-13 ENCOUNTER — Other Ambulatory Visit: Payer: Self-pay | Admitting: Family Medicine

## 2018-07-13 DIAGNOSIS — F32A Depression, unspecified: Secondary | ICD-10-CM

## 2018-07-13 DIAGNOSIS — F5104 Psychophysiologic insomnia: Secondary | ICD-10-CM

## 2018-07-13 DIAGNOSIS — F329 Major depressive disorder, single episode, unspecified: Secondary | ICD-10-CM

## 2018-07-13 DIAGNOSIS — F419 Anxiety disorder, unspecified: Principal | ICD-10-CM

## 2018-07-13 NOTE — Telephone Encounter (Signed)
Please advise on refill request for Alprazolam. Last filled 05/22/18 qty#90 rf#1

## 2018-07-15 ENCOUNTER — Encounter: Payer: Self-pay | Admitting: Family Medicine

## 2018-07-15 ENCOUNTER — Other Ambulatory Visit: Payer: Self-pay

## 2018-07-15 ENCOUNTER — Ambulatory Visit (INDEPENDENT_AMBULATORY_CARE_PROVIDER_SITE_OTHER): Payer: Medicare Other | Admitting: Family Medicine

## 2018-07-15 VITALS — BP 152/74 | HR 62 | Temp 97.7°F | Resp 18 | Ht 61.5 in | Wt 210.1 lb

## 2018-07-15 DIAGNOSIS — I10 Essential (primary) hypertension: Secondary | ICD-10-CM | POA: Diagnosis not present

## 2018-07-15 DIAGNOSIS — F329 Major depressive disorder, single episode, unspecified: Secondary | ICD-10-CM | POA: Diagnosis not present

## 2018-07-15 DIAGNOSIS — F419 Anxiety disorder, unspecified: Secondary | ICD-10-CM | POA: Diagnosis not present

## 2018-07-15 DIAGNOSIS — F32A Depression, unspecified: Secondary | ICD-10-CM

## 2018-07-15 NOTE — Patient Instructions (Signed)
Good to see you today  Please follow up in 6 months

## 2018-07-15 NOTE — Progress Notes (Signed)
Subjective:    Patient ID: Virginia Romero, female    DOB: 11/08/1955, 63 y.o.   MRN: [id number]  HPI This is a 63 yo female who presents today for follow up of elevated blood pressure and worsening depression. Her brother died 2 months ago unexpectedly and she had significant worsening of her depression. She is seeing a counselor twice a month (up from once a month) but this is a financial strain for her. Her mood is better after we increased her duloxetine to 60 mg.   HTN- blood pressure very elevated at last visit. Losartan was increased from 50 mg to 100 mg. Patient reports decreased feelings of heart pounding in her chest and decreased anxiety.   Past Medical History:  Diagnosis Date  . Anxiety   . Arthritis    KNEES AND LOWER BACK   . Cancer (HCC)    THYROID  . 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 Depression   . Diabetes mellitus    Type II  . Endometrial polyp   . GERD (gastroesophageal reflux disease)   . History of ovarian cyst   . Hyperlipemia   . Hypertension   . Osteopenia 09/2016   T score -1.5  . PTSD (post-traumatic stress disorder)   . Shortness of breath dyspnea    with anxiety  . Thyroid disease    Past Surgical History:  Procedure Laterality Date  . CESAREAN SECTION  '79 AND '86   X2  . COLONOSCOPY     with polypectomy  . DIAGNOSTIC LAPAROSCOPY  07/17/2006   WITH LYSIS OF PERITUBULAR AND PERIOVARIAN ADHESIONS, LYSIS OF OMENTAL ADHESIONS, LSO, RIGHT  OVARIAN CYSTECTOMY , HYSTEROSCOPY  WITH EXCISION OF ENDOMETRIAL POLYP...  . ORIF HUMERUS FRACTURE Left 03/01/2015   Procedure: OPEN REDUCTION INTERNAL FIXATION (ORIF) PROXIMAL HUMERUS FRACTURE;  Surgeon: Netta Cedars, MD;  Location: [facility];  Service: Orthopedics;  Laterality: Left;  . THYROIDECTOMY  2000  . TUBAL LIGATION     Family History  Problem Relation Age of Onset  . Hypertension Mother   . Heart disease Mother   . Diabetes Brother   . Hypertension Maternal  Grandmother   . Heart disease Maternal Grandmother   . Diabetes Maternal Grandfather   . Depression Daughter   . Diabetes Paternal Grandfather    Social History   Tobacco Use  . Smoking status: Current Every Day Smoker    Packs/day: 0.50    Years: 30.00    Pack years: 15.00    Types: Cigarettes  . Smokeless tobacco: Never Used  Substance Use Topics  . Alcohol use: No  . Drug use: No      Review of Systems Per HPI    Objective:   Physical Exam   BP (!) 152/74 (BP Location: Right Arm, Patient Position: Sitting, Cuff Size: Large)   Pulse 62   Temp 97.7 F (36.5 C) (Oral)   Resp 18   Ht 5' 1.5" (1.562 m)   Wt 210 lb 1.6 oz (95.3 kg)   SpO2 94%   BMI 39.06 kg/m  BP Readings from Last 3 Encounters:  07/15/18 (!) 152/74  06/15/18 (!) 190/80  12/12/17 (!) 158/86   Wt Readings from Last 3 Encounters:  07/15/18 210 lb 1.6 oz (95.3 kg)  06/15/18 214 lb (97.1 kg)  12/12/17 203 lb (92.1 kg)    Depression screen [facility] 2/9 07/15/2018 06/15/2018 12/12/2017 06/09/2017 01/20/2017  Decreased Interest 2 - 3 3  1  Down, Depressed, Hopeless 2 2 3 3 1   PHQ - 2 Score 4 2 6 6 2   Altered sleeping 2 2 3 3 1   Tired, decreased energy 1 2 3 3 1   Change in appetite 2 2 3 2 3   Feeling bad or failure about yourself  2 2 3 3 3   Trouble concentrating 1 2 3 3  0  Moving slowly or fidgety/restless 1 2 2 3  -  Suicidal thoughts 0 2 2 2  0  PHQ-9 Score 13 16 25 25 10   Difficult doing work/chores Somewhat difficult Somewhat difficult Somewhat difficult - Not difficult at all  Some recent data might be hidden       Assessment & Plan:  1. Essential hypertension - BP significantly improved today - continue current meds - follow up in 6 months (patient with difficulty getting transportation to office visits), she knows to contact me if any question/concerns arise prior to next visit  2. Anxiety and depression - improved, continue current meds and counseling   Clarene Reamer, FNP-BC  [place]  [facility]  07/15/2018 9:28 AM

## 2018-07-30 ENCOUNTER — Ambulatory Visit (HOSPITAL_COMMUNITY): Payer: Medicare Other | Admitting: Licensed Clinical Social Worker

## 2018-08-10 ENCOUNTER — Other Ambulatory Visit: Payer: Self-pay | Admitting: Family Medicine

## 2018-08-10 DIAGNOSIS — F329 Major depressive disorder, single episode, unspecified: Secondary | ICD-10-CM

## 2018-08-10 DIAGNOSIS — F5104 Psychophysiologic insomnia: Secondary | ICD-10-CM

## 2018-08-10 DIAGNOSIS — F32A Depression, unspecified: Secondary | ICD-10-CM

## 2018-08-10 DIAGNOSIS — F419 Anxiety disorder, unspecified: Principal | ICD-10-CM

## 2018-08-10 NOTE — Telephone Encounter (Signed)
Refill sent.

## 2018-08-10 NOTE — Telephone Encounter (Signed)
Please advise on below  

## 2018-08-17 ENCOUNTER — Other Ambulatory Visit: Payer: Self-pay | Admitting: Family Medicine

## 2018-09-04 ENCOUNTER — Other Ambulatory Visit: Payer: Self-pay | Admitting: Family Medicine

## 2018-09-09 ENCOUNTER — Encounter: Payer: Medicare Other | Admitting: Women's Health

## 2018-10-05 ENCOUNTER — Other Ambulatory Visit: Payer: Self-pay | Admitting: Family Medicine

## 2018-10-05 DIAGNOSIS — F32A Depression, unspecified: Secondary | ICD-10-CM

## 2018-10-05 DIAGNOSIS — F329 Major depressive disorder, single episode, unspecified: Secondary | ICD-10-CM

## 2018-10-05 DIAGNOSIS — F5104 Psychophysiologic insomnia: Secondary | ICD-10-CM

## 2018-10-05 DIAGNOSIS — F419 Anxiety disorder, unspecified: Principal | ICD-10-CM

## 2018-10-05 NOTE — Telephone Encounter (Signed)
Name of Medication: Alprazolam  Last Fill or Written Date and Quantity: 08/10/2018 for 60 tablets with 1 refill Last Office Visit and Type: 07/15/2018 follow up Next Office Visit and Type: 01/13/2019 follow up  Please review

## 2018-10-29 ENCOUNTER — Other Ambulatory Visit: Payer: Self-pay

## 2018-10-29 DIAGNOSIS — F419 Anxiety disorder, unspecified: Secondary | ICD-10-CM

## 2018-10-29 DIAGNOSIS — F329 Major depressive disorder, single episode, unspecified: Secondary | ICD-10-CM

## 2018-10-29 DIAGNOSIS — F32A Depression, unspecified: Secondary | ICD-10-CM

## 2018-10-29 DIAGNOSIS — F5104 Psychophysiologic insomnia: Secondary | ICD-10-CM

## 2018-10-29 NOTE — Telephone Encounter (Signed)
Name of Medication: Alprazolam  Last Fill or Written Date and Quantity: 10/05/2018 for 60 tablets with 0 refills Last Office Visit and Type: 07/15/2018 follow up Next Office Visit and Type: 01/13/2019 follow up  Please review

## 2018-10-30 ENCOUNTER — Other Ambulatory Visit: Payer: Self-pay | Admitting: *Deleted

## 2018-10-30 DIAGNOSIS — F32A Depression, unspecified: Secondary | ICD-10-CM

## 2018-10-30 DIAGNOSIS — F5104 Psychophysiologic insomnia: Secondary | ICD-10-CM

## 2018-10-30 DIAGNOSIS — F329 Major depressive disorder, single episode, unspecified: Secondary | ICD-10-CM

## 2018-10-30 DIAGNOSIS — F419 Anxiety disorder, unspecified: Secondary | ICD-10-CM

## 2018-10-30 MED ORDER — ALPRAZOLAM 1 MG PO TABS: ORAL_TABLET | ORAL | 1 refills | Status: DC

## 2018-10-30 NOTE — Telephone Encounter (Signed)
Please call patient and let her know that I have sent in refill to pharmacy. I wold like her to take no more than 2 a day. Please call patient's pharmacy and ok early refill.

## 2018-10-30 NOTE — Telephone Encounter (Addendum)
Spoke with patient and she agrees with the no more than 2 per day but states that she has not been able to see her therapist and things have escalated with her anxiety through this COVID situation. Would like for me to make Debbie aware.    Also, did call [facility] to make them aware of verbal approval to fill early.

## 2018-10-30 NOTE — Telephone Encounter (Signed)
Patient left a voicemail stating that she needs a refill early on her Alprazolam. Patient stated that she knows that she is not due a refill until 11/06/18. Patient stated that she was switched from 3 daily to 2 daily, but has been having to take more because of her nervousness and not being able to sleep. Patients wants to know if you will send her a new script in early and will re-adjust her medication again when she comes to her  visit in August?

## 2018-11-03 ENCOUNTER — Other Ambulatory Visit: Payer: Self-pay

## 2018-11-04 ENCOUNTER — Ambulatory Visit (INDEPENDENT_AMBULATORY_CARE_PROVIDER_SITE_OTHER): Payer: Medicare Other | Admitting: Women's Health

## 2018-11-04 ENCOUNTER — Encounter: Payer: Self-pay | Admitting: Women's Health

## 2018-11-04 VITALS — BP 148/80 | Ht 61.0 in

## 2018-11-04 DIAGNOSIS — Z01419 Encounter for gynecological examination (general) (routine) without abnormal findings: Secondary | ICD-10-CM | POA: Diagnosis not present

## 2018-11-04 DIAGNOSIS — E2839 Other primary ovarian failure: Secondary | ICD-10-CM

## 2018-11-04 DIAGNOSIS — Z13820 Encounter for screening for osteoporosis: Secondary | ICD-10-CM

## 2018-11-04 DIAGNOSIS — M8588 Other specified disorders of bone density and structure, other site: Secondary | ICD-10-CM

## 2018-11-04 NOTE — Addendum Note (Signed)
Addended by: Lorine Bears on: 11/04/2018 11:16 AM   Modules accepted: Orders

## 2018-11-04 NOTE — Progress Notes (Signed)
Virginia Romero 1955/09/07 [id number]    History:    Presents for breast and pelvic exam with no GYN complaints.  Postmenopausal on no HRT with no bleeding, not sexually active in greater than 15 years.  Normal Pap and mammogram history.  2018 T score -1.5 FRAX 7.5% / 1%.  2016 right shoulder fracture with traumatic fall down cement stairs.  History of benign colon polyp and negative Cologuard.  Primary care manages diabetes, hypertension, anxiety/depression, arthritis and hypothyroidism. . Past medical history, past surgical history, family history and social history were all reviewed and documented in the EPIC chart.  On disability anxiety/depression and PTSD.  Has 2 children.  2015 benign endometrial polyp no bleeding since.  ROS:  A ROS was performed and pertinent positives and negatives are included.  Exam:  Vitals:   11/04/18 1023  BP: (!) 148/80  Height: 5\' 1"  (1.549 m)   Body mass index is 39.7 kg/m.   General appearance:  Normal Thyroid:  Symmetrical, normal in size, without palpable masses or nodularity. Respiratory  Auscultation:  Clear without wheezing or rhonchi Cardiovascular  Auscultation:  Regular rate, without rubs, murmurs or gallops  Edema/varicosities:  Not grossly evident Abdominal  Soft,nontender, without masses, guarding or rebound.  Liver/spleen:  No organomegaly noted  Hernia:  None appreciated  Skin  Inspection:  Grossly normal   Breasts: Examined lying and sitting.     Right: Without masses, retractions, discharge or axillary adenopathy.     Left: Without masses, retractions, discharge or axillary adenopathy. Gentitourinary   Inguinal/mons:  Normal without inguinal adenopathy  External genitalia:  Normal  BUS/Urethra/Skene's glands:  Normal  Vagina:  Normal  Cervix:  Normal  Uterus:  normal in size, shape and contour.  Midline and mobile  Adnexa/parametria:     Rt: Without masses or tenderness.   Lt: Without masses or tenderness.  Anus and  perineum: Normal  Digital rectal exam: Normal sphincter tone without palpated masses or tenderness  Assessment/Plan:  63 y.o. W WF G2 P2 for breast and pelvic exam with no GYN complaints.  Postmenopausal/no HRT/no bleeding/not sexually active Obesity Diabetes, hypertension, hypothyroidism, anxiety/depression-primary care manages labs and meds Osteopenia without elevated FRAX  Plan: Repeat DEXA, reviewed importance of weightbearing and balance type exercise.  Home safety, fall prevention discussed.  SBEs, continue annual screening mammogram, calcium rich foods, vitamin D 2000 daily encouraged.  Reviewed importance of increasing exercise and decreasing calorie/carbs.  Pap.  New screening guidelines reviewed.    [place], 10:59 AM 11/04/2018

## 2018-11-04 NOTE — Patient Instructions (Signed)
h Health Maintenance for Postmenopausal Women Menopause is a normal process in which your reproductive ability comes to an end. This process happens gradually over a span of months to years, usually between the ages of 78 and 79. Menopause is complete when you have missed 12 consecutive menstrual periods. It is important to talk with your health care provider about some of the most common conditions that affect postmenopausal women, such as heart disease, cancer, and bone loss (osteoporosis). Adopting a healthy lifestyle and getting preventive care can help to promote your health and wellness. Those actions can also lower your chances of developing some of these common conditions. What should I know about menopause? During menopause, you may experience a number of symptoms, such as:  Moderate-to-severe hot flashes.  Night sweats.  Decrease in sex drive.  Mood swings.  Headaches.  Tiredness.  Irritability.  Memory problems.  Insomnia. Choosing to treat or not to treat menopausal changes is an individual decision that you make with your health care provider. What should I know about hormone replacement therapy and supplements? Hormone therapy products are effective for treating symptoms that are associated with menopause, such as hot flashes and night sweats. Hormone replacement carries certain risks, especially as you become older. If you are thinking about using estrogen or estrogen with progestin treatments, discuss the benefits and risks with your health care provider. What should I know about heart disease and stroke? Heart disease, heart attack, and stroke become more likely as you age. This may be due, in part, to the hormonal changes that your body experiences during menopause. These can affect how your body processes dietary fats, triglycerides, and cholesterol. Heart attack and stroke are both medical emergencies. There are many things that you can do to help prevent heart  disease and stroke:  Have your blood pressure checked at least every 1-2 years. High blood pressure causes heart disease and increases the risk of stroke.  If you are 3-14 years old, ask your health care provider if you should take aspirin to prevent a heart attack or a stroke.  Do not use any tobacco products, including cigarettes, chewing tobacco, or electronic cigarettes. If you need help quitting, ask your health care provider.  It is important to eat a healthy diet and maintain a healthy weight. ? Be sure to include plenty of vegetables, fruits, low-fat dairy products, and lean protein. ? Avoid eating foods that are high in solid fats, added sugars, or salt (sodium).  Get regular exercise. This is one of the most important things that you can do for your health. ? Try to exercise for at least 150 minutes each week. The type of exercise that you do should increase your heart rate and make you sweat. This is known as moderate-intensity exercise. ? Try to do strengthening exercises at least twice each week. Do these in addition to the moderate-intensity exercise.  Know your numbers.Ask your health care provider to check your cholesterol and your blood glucose. Continue to have your blood tested as directed by your health care provider.  What should I know about cancer screening? There are several types of cancer. Take the following steps to reduce your risk and to catch any cancer development as early as possible. Breast Cancer  Practice breast self-awareness. ? This means understanding how your breasts normally appear and feel. ? It also means doing regular breast self-exams. Let your health care provider know about any changes, no matter how small.  If you are 40  or older, have a clinician do a breast exam (clinical breast exam or CBE) every year. Depending on your age, family history, and medical history, it may be recommended that you also have a yearly breast X-ray (mammogram).   If you have a family history of breast cancer, talk with your health care provider about genetic screening.  If you are at high risk for breast cancer, talk with your health care provider about having an MRI and a mammogram every year.  Breast cancer (BRCA) gene test is recommended for women who have family members with BRCA-related cancers. Results of the assessment will determine the need for genetic counseling and BRCA1 and for BRCA2 testing. BRCA-related cancers include these types: ? Breast. This occurs in males or females. ? Ovarian. ? Tubal. This may also be called fallopian tube cancer. ? Cancer of the abdominal or pelvic lining (peritoneal cancer). ? Prostate. ? Pancreatic. Cervical, Uterine, and Ovarian Cancer Your health care provider may recommend that you be screened regularly for cancer of the pelvic organs. These include your ovaries, uterus, and vagina. This screening involves a pelvic exam, which includes checking for microscopic changes to the surface of your cervix (Pap test).  For women ages 21-65, health care providers may recommend a pelvic exam and a Pap test every three years. For women ages 55-65, they may recommend the Pap test and pelvic exam, combined with testing for human papilloma virus (HPV), every five years. Some types of HPV increase your risk of cervical cancer. Testing for HPV may also be done on women of any age who have unclear Pap test results.  Other health care providers may not recommend any screening for nonpregnant women who are considered low risk for pelvic cancer and have no symptoms. Ask your health care provider if a screening pelvic exam is right for you.  If you have had past treatment for cervical cancer or a condition that could lead to cancer, you need Pap tests and screening for cancer for at least 20 years after your treatment. If Pap tests have been discontinued for you, your risk factors (such as having a new sexual partner) need to be  reassessed to determine if you should start having screenings again. Some women have medical problems that increase the chance of getting cervical cancer. In these cases, your health care provider may recommend that you have screening and Pap tests more often.  If you have a family history of uterine cancer or ovarian cancer, talk with your health care provider about genetic screening.  If you have vaginal bleeding after reaching menopause, tell your health care provider.  There are currently no reliable tests available to screen for ovarian cancer. Lung Cancer Lung cancer screening is recommended for adults 95-76 years old who are at high risk for lung cancer because of a history of smoking. A yearly low-dose CT scan of the lungs is recommended if you:  Currently smoke.  Have a history of at least 30 pack-years of smoking and you currently smoke or have quit within the past 15 years. A pack-year is smoking an average of one pack of cigarettes per day for one year. Yearly screening should:  Continue until it has been 15 years since you quit.  Stop if you develop a health problem that would prevent you from having lung cancer treatment. Colorectal Cancer  This type of cancer can be detected and can often be prevented.  Routine colorectal cancer screening usually begins at age 72 and continues  through age 60.  If you have risk factors for colon cancer, your health care provider may recommend that you be screened at an earlier age.  If you have a family history of colorectal cancer, talk with your health care provider about genetic screening.  Your health care provider may also recommend using home test kits to check for hidden blood in your stool.  A small camera at the end of a tube can be used to examine your colon directly (sigmoidoscopy or colonoscopy). This is done to check for the earliest forms of colorectal cancer.  Direct examination of the colon should be repeated every 5-10  years until age 24. However, if early forms of precancerous polyps or small growths are found or if you have a family history or genetic risk for colorectal cancer, you may need to be screened more often. Skin Cancer  Check your skin from head to toe regularly.  Monitor any moles. Be sure to tell your health care provider: ? About any new moles or changes in moles, especially if there is a change in a mole's shape or color. ? If you have a mole that is larger than the size of a pencil eraser.  If any of your family members has a history of skin cancer, especially at a young age, talk with your health care provider about genetic screening.  Always use sunscreen. Apply sunscreen liberally and repeatedly throughout the day.  Whenever you are outside, protect yourself by wearing long sleeves, pants, a wide-brimmed hat, and sunglasses. What should I know about osteoporosis? Osteoporosis is a condition in which bone destruction happens more quickly than new bone creation. After menopause, you may be at an increased risk for osteoporosis. To help prevent osteoporosis or the bone fractures that can happen because of osteoporosis, the following is recommended:  If you are 73-42 years old, get at least 1,000 mg of calcium and at least 600 mg of vitamin D per day.  If you are older than age 12 but younger than age 30, get at least 1,200 mg of calcium and at least 600 mg of vitamin D per day.  If you are older than age 58, get at least 1,200 mg of calcium and at least 800 mg of vitamin D per day. Smoking and excessive alcohol intake increase the risk of osteoporosis. Eat foods that are rich in calcium and vitamin D, and do weight-bearing exercises several times each week as directed by your health care provider. What should I know about how menopause affects my mental health? Depression may occur at any age, but it is more common as you become older. Common symptoms of depression include:  Low or sad  mood.  Changes in sleep patterns.  Changes in appetite or eating patterns.  Feeling an overall lack of motivation or enjoyment of activities that you previously enjoyed.  Frequent crying spells. Talk with your health care provider if you think that you are experiencing depression. What should I know about immunizations? It is important that you get and maintain your immunizations. These include:  Tetanus, diphtheria, and pertussis (Tdap) booster vaccine.  Influenza every year before the flu season begins.  Pneumonia vaccine.  Shingles vaccine. Your health care provider may also recommend other immunizations. This information is not intended to replace advice given to you by your health care provider. Make sure you discuss any questions you have with your health care provider. Document Released: 07/12/2005 Document Revised: 12/08/2015 Document Reviewed: 02/21/2015 Elsevier Interactive Patient  Education  2019 [place] for Diabetes Mellitus, Adult  Carbohydrate counting is a method of keeping track of how many carbohydrates you eat. Eating carbohydrates naturally increases the amount of sugar (glucose) in the blood. Counting how many carbohydrates you eat helps keep your blood glucose within normal limits, which helps you manage your diabetes (diabetes mellitus). It is important to know how many carbohydrates you can safely have in each meal. This is different for every person. A diet and nutrition specialist (registered dietitian) can help you make a meal plan and calculate how many carbohydrates you should have at each meal and snack. Carbohydrates are found in the following foods:  Grains, such as breads and cereals.  Dried beans and soy products.  Starchy vegetables, such as potatoes, peas, and corn.  Fruit and fruit juices.  Milk and yogurt.  Sweets and snack foods, such as cake, cookies, candy, chips, and soft drinks. How do I count  carbohydrates? There are two ways to count carbohydrates in food. You can use either of the methods or a combination of both. Reading "Nutrition Facts" on packaged food The "Nutrition Facts" list is included on the labels of almost all packaged foods and beverages in the [place] It includes:  The serving size.  Information about nutrients in each serving, including the grams (g) of carbohydrate per serving. To use the "Nutrition Facts":  Decide how many servings you will have.  Multiply the number of servings by the number of carbohydrates per serving.  The resulting number is the total amount of carbohydrates that you will be having. Learning standard serving sizes of other foods When you eat carbohydrate foods that are not packaged or do not include "Nutrition Facts" on the label, you need to measure the servings in order to count the amount of carbohydrates:  Measure the foods that you will eat with a food scale or measuring cup, if needed.  Decide how many standard-size servings you will eat.  Multiply the number of servings by 15. Most carbohydrate-rich foods have about 15 g of carbohydrates per serving. ? For example, if you eat 8 oz (170 g) of strawberries, you will have eaten 2 servings and 30 g of carbohydrates (2 servings x 15 g = 30 g).  For foods that have more than one food mixed, such as soups and casseroles, you must count the carbohydrates in each food that is included. The following list contains standard serving sizes of common carbohydrate-rich foods. Each of these servings has about 15 g of carbohydrates:   hamburger bun or  English muffin.   oz (15 mL) syrup.   oz (14 g) jelly.  1 slice of bread.  1 six-inch tortilla.  3 oz (85 g) cooked rice or pasta.  4 oz (113 g) cooked dried beans.  4 oz (113 g) starchy vegetable, such as peas, corn, or potatoes.  4 oz (113 g) hot cereal.  4 oz (113 g) mashed potatoes or  of a large baked potato.  4 oz (113  g) canned or frozen fruit.  4 oz (120 mL) fruit juice.  4-6 crackers.  6 chicken nuggets.  6 oz (170 g) unsweetened dry cereal.  6 oz (170 g) plain fat-free yogurt or yogurt sweetened with artificial sweeteners.  8 oz (240 mL) milk.  8 oz (170 g) fresh fruit or one small piece of fruit.  24 oz (680 g) popped popcorn. Example of carbohydrate counting Sample meal  3 oz (85 g) chicken  breast.  6 oz (170 g) brown rice.  4 oz (113 g) corn.  8 oz (240 mL) milk.  8 oz (170 g) strawberries with sugar-free whipped topping. Carbohydrate calculation 1. Identify the foods that contain carbohydrates: ? Rice. ? Corn. ? Milk. ? Strawberries. 2. Calculate how many servings you have of each food: ? 2 servings rice. ? 1 serving corn. ? 1 serving milk. ? 1 serving strawberries. 3. Multiply each number of servings by 15 g: ? 2 servings rice x 15 g = 30 g. ? 1 serving corn x 15 g = 15 g. ? 1 serving milk x 15 g = 15 g. ? 1 serving strawberries x 15 g = 15 g. 4. Add together all of the amounts to find the total grams of carbohydrates eaten: ? 30 g + 15 g + 15 g + 15 g = 75 g of carbohydrates total. Summary  Carbohydrate counting is a method of keeping track of how many carbohydrates you eat.  Eating carbohydrates naturally increases the amount of sugar (glucose) in the blood.  Counting how many carbohydrates you eat helps keep your blood glucose within normal limits, which helps you manage your diabetes.  A diet and nutrition specialist (registered dietitian) can help you make a meal plan and calculate how many carbohydrates you should have at each meal and snack. This information is not intended to replace advice given to you by your health care provider. Make sure you discuss any questions you have with your health care provider. Document Released: 05/20/2005 Document Revised: 11/27/2016 Document Reviewed: 11/01/2015 Elsevier Interactive Patient Education  2019 [employer].

## 2018-11-06 LAB — PAP IG W/ RFLX HPV ASCU

## 2018-11-29 ENCOUNTER — Other Ambulatory Visit: Payer: Self-pay | Admitting: Family Medicine

## 2018-11-29 DIAGNOSIS — E785 Hyperlipidemia, unspecified: Secondary | ICD-10-CM

## 2018-11-29 DIAGNOSIS — E1169 Type 2 diabetes mellitus with other specified complication: Secondary | ICD-10-CM

## 2019-01-13 ENCOUNTER — Encounter: Payer: Self-pay | Admitting: Family Medicine

## 2019-01-13 ENCOUNTER — Other Ambulatory Visit: Payer: Self-pay

## 2019-01-13 ENCOUNTER — Ambulatory Visit (INDEPENDENT_AMBULATORY_CARE_PROVIDER_SITE_OTHER): Payer: Medicare Other | Admitting: Family Medicine

## 2019-01-13 VITALS — BP 110/60 | HR 75 | Temp 96.5°F | Ht 61.0 in | Wt 209.0 lb

## 2019-01-13 DIAGNOSIS — Z720 Tobacco use: Secondary | ICD-10-CM | POA: Diagnosis not present

## 2019-01-13 DIAGNOSIS — E785 Hyperlipidemia, unspecified: Secondary | ICD-10-CM

## 2019-01-13 DIAGNOSIS — I10 Essential (primary) hypertension: Secondary | ICD-10-CM

## 2019-01-13 DIAGNOSIS — E034 Atrophy of thyroid (acquired): Secondary | ICD-10-CM | POA: Diagnosis not present

## 2019-01-13 DIAGNOSIS — F5104 Psychophysiologic insomnia: Secondary | ICD-10-CM | POA: Insufficient documentation

## 2019-01-13 DIAGNOSIS — E1169 Type 2 diabetes mellitus with other specified complication: Secondary | ICD-10-CM | POA: Diagnosis not present

## 2019-01-13 DIAGNOSIS — F329 Major depressive disorder, single episode, unspecified: Secondary | ICD-10-CM

## 2019-01-13 DIAGNOSIS — F419 Anxiety disorder, unspecified: Secondary | ICD-10-CM | POA: Diagnosis not present

## 2019-01-13 DIAGNOSIS — F32A Depression, unspecified: Secondary | ICD-10-CM

## 2019-01-13 DIAGNOSIS — R944 Abnormal results of kidney function studies: Secondary | ICD-10-CM | POA: Diagnosis not present

## 2019-01-13 LAB — COMPREHENSIVE METABOLIC PANEL
ALT: 20 U/L (ref 0–35)
AST: 20 U/L (ref 0–37)
Albumin: 4.4 g/dL (ref 3.5–5.2)
Alkaline Phosphatase: 120 U/L — ABNORMAL HIGH (ref 39–117)
BUN: 16 mg/dL (ref 6–23)
CO2: 28 mEq/L (ref 19–32)
Calcium: 9.8 mg/dL (ref 8.4–10.5)
Chloride: 102 mEq/L (ref 96–112)
Creatinine, Ser: 1.13 mg/dL (ref 0.40–1.20)
GFR: 48.72 mL/min — ABNORMAL LOW (ref >60.00)
Glucose, Bld: 112 mg/dL — ABNORMAL HIGH (ref 70–99)
Potassium: 4.3 mEq/L (ref 3.5–5.1)
Sodium: 140 mEq/L (ref 135–145)
Total Bilirubin: 0.4 mg/dL (ref 0.2–1.2)
Total Protein: 7.4 g/dL (ref 6.0–8.3)

## 2019-01-13 LAB — TSH: TSH: 0.26 u[IU]/mL — ABNORMAL LOW (ref 0.35–4.50)

## 2019-01-13 LAB — HEMOGLOBIN A1C: Hgb A1c MFr Bld: 7.1 % — ABNORMAL HIGH (ref 4.6–6.5)

## 2019-01-13 MED ORDER — ALPRAZOLAM 1 MG PO TABS: 1.0000 mg | ORAL_TABLET | Freq: Three times a day (TID) | ORAL | 5 refills | Status: DC | PRN

## 2019-01-13 NOTE — Patient Instructions (Addendum)
Look at [employer]- series I think on Netflix  Follow up in 6 months for your annual wellness visit  So good to see you!  Hang in there!

## 2019-01-13 NOTE — Progress Notes (Signed)
Subjective:    Patient ID: Virginia Romero, female    DOB: 1955-12-07, 63 y.o.   MRN: [id number]  HPI This is a 63 yo female who presents today for follow up of chronic medical conditions.  She reports that she has had quite a bit of difficulty with increased anxiety during pandemic.  We had cut her alprazolam down to twice a day several months ago and she reports that this is been very difficult for her and she is struggling with anxiety on a daily basis.  She reports that it keeps her awake at night worrying about having enough of her alprazolam.  She has been afraid to leave her house or get together with her friends and family and feels very isolated.  She is unable to afford to go to therapy as frequently as it has been suggested.  She reports that she felt much better when she was able to take alprazolam 3 times daily as needed and she tries not to take it that many times every day but that it really helps manage her anxiety.  She is also on duloxetine 60 mg daily for blood pressure we had added metoprolol in the past to see if that would also help with anxiety and she does report having fewer sensations of palpitations. She has been watching what she eats and trying to stay as active as possible with yard work and maintaining her home and taking care of her dogs and cat. She continues to have occasional left shoulder and right knee pain.  For the most part these have been well managed.  ROS- she denies chest pain, SOB, increased cough, LE edema, abdominal pain, diarrhea/constipation  Past Medical History:  Diagnosis Date  . Anxiety   . Arthritis    KNEES AND LOWER BACK   . Cancer (HCC)    THYROID  . 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 Depression   . Diabetes mellitus    Type II  . Endometrial polyp   . GERD (gastroesophageal reflux disease)   . History of ovarian cyst   . Hyperlipemia   . Hypertension   . Osteopenia 09/2016   T score -1.5  .  PTSD (post-traumatic stress disorder)   . Shortness of breath dyspnea    with anxiety  . Thyroid disease    Past Surgical History:  Procedure Laterality Date  . CESAREAN SECTION  '79 AND '86   X2  . COLONOSCOPY     with polypectomy  . DIAGNOSTIC LAPAROSCOPY  07/17/2006   WITH LYSIS OF PERITUBULAR AND PERIOVARIAN ADHESIONS, LYSIS OF OMENTAL ADHESIONS, LSO, RIGHT  OVARIAN CYSTECTOMY , HYSTEROSCOPY  WITH EXCISION OF ENDOMETRIAL POLYP...  . ORIF HUMERUS FRACTURE Left 03/01/2015   Procedure: OPEN REDUCTION INTERNAL FIXATION (ORIF) PROXIMAL HUMERUS FRACTURE;  Surgeon: Netta Cedars, MD;  Location: [facility];  Service: Orthopedics;  Laterality: Left;  . THYROIDECTOMY  2000  . TUBAL LIGATION     Family History  Problem Relation Age of Onset  . Hypertension Mother   . Heart disease Mother   . Diabetes Brother   . Hypertension Maternal Grandmother   . Heart disease Maternal Grandmother   . Diabetes Maternal Grandfather   . Depression Daughter   . Diabetes Paternal Grandfather    Social History   Tobacco Use  . Smoking status: Current Every Day Smoker    Packs/day: 0.50    Years: 30.00    Pack years: 15.00  Types: Cigarettes  . Smokeless tobacco: Never Used  Substance Use Topics  . Alcohol use: No  . Drug use: No      Review of Systems Per HPI    Objective:   Physical Exam Vitals signs reviewed.  Constitutional:      General: She is not in acute distress.    Appearance: Normal appearance. She is obese. She is not ill-appearing, toxic-appearing or diaphoretic.  HENT:     Head: Normocephalic and atraumatic.  Cardiovascular:     Rate and Rhythm: Normal rate.  Pulmonary:     Effort: Pulmonary effort is normal.  Skin:    General: Skin is warm and dry.  Neurological:     Mental Status: She is alert and oriented to person, place, and time.  Psychiatric:        Mood and Affect: Mood normal.        Behavior: Behavior normal.        Thought Content: Thought content normal.         Judgment: Judgment normal.      BP 110/60 (BP Location: Left Arm, Patient Position: Sitting, Cuff Size: Normal)   Pulse 75   Temp (!) 96.5 F (35.8 C) (Temporal)   Ht 5\' 1"  (1.549 m)   Wt 209 lb (94.8 kg)   SpO2 95%   BMI 39.49 kg/m  Wt Readings from Last 3 Encounters:  01/13/19 209 lb (94.8 kg)  07/15/18 210 lb 1.6 oz (95.3 kg)  06/15/18 214 lb (97.1 kg)        Assessment & Plan:  1. Anxiety and depression - discussed potential side effects from benzo use, will increase and see how she does, she is to report any worsening symptoms - encouraged increased socialization in a safe manner - ALPRAZolam (XANAX) 1 MG tablet; Take 1 tablet (1 mg total) by mouth 3 (three) times daily as needed for anxiety. TAKE 1 TABLET BY MOUTH TWICE DAILY AS NEEDED.  Dispense: 90 tablet; Refill: 5  2. Psychophysiological insomnia - ALPRAZolam (XANAX) 1 MG tablet; Take 1 tablet (1 mg total) by mouth 3 (three) times daily as needed for anxiety. TAKE 1 TABLET BY MOUTH TWICE DAILY AS NEEDED.  Dispense: 90 tablet; Refill: 5  3. Essential hypertension - well controlled with current meds - Comprehensive metabolic panel  4. Hypothyroidism due to acquired atrophy of thyroid - TSH  5. DM type 2 with diabetic dyslipidemia (HCC) - Comprehensive metabolic panel - Hemoglobin A1c  6. Tobacco abuse - not currently interested in quitting  - follow up in 6 months for AWV Clarene Reamer, FNP-BC  [facility], [place]  01/13/2019 2:01 PM

## 2019-01-20 ENCOUNTER — Encounter: Payer: Self-pay | Admitting: Family Medicine

## 2019-01-20 DIAGNOSIS — Z23 Encounter for immunization: Secondary | ICD-10-CM | POA: Diagnosis not present

## 2019-01-20 MED ORDER — LEVOTHYROXINE SODIUM 150 MCG PO TABS: ORAL_TABLET | ORAL | 3 refills | Status: DC

## 2019-01-20 NOTE — Addendum Note (Signed)
Addended by: Clarene Reamer B on: 01/20/2019 05:44 PM   Modules accepted: Orders

## 2019-01-29 DIAGNOSIS — H2513 Age-related nuclear cataract, bilateral: Secondary | ICD-10-CM | POA: Diagnosis not present

## 2019-01-29 DIAGNOSIS — H524 Presbyopia: Secondary | ICD-10-CM | POA: Diagnosis not present

## 2019-01-29 DIAGNOSIS — E119 Type 2 diabetes mellitus without complications: Secondary | ICD-10-CM | POA: Diagnosis not present

## 2019-01-29 LAB — HM DIABETES EYE EXAM

## 2019-03-02 ENCOUNTER — Encounter: Payer: Self-pay | Admitting: Gynecology

## 2019-03-16 ENCOUNTER — Other Ambulatory Visit: Payer: Self-pay | Admitting: Family Medicine

## 2019-03-16 DIAGNOSIS — E1169 Type 2 diabetes mellitus with other specified complication: Secondary | ICD-10-CM

## 2019-04-05 ENCOUNTER — Other Ambulatory Visit: Payer: Self-pay | Admitting: Family Medicine

## 2019-04-09 ENCOUNTER — Other Ambulatory Visit: Payer: Self-pay | Admitting: Family Medicine

## 2019-04-22 ENCOUNTER — Other Ambulatory Visit (INDEPENDENT_AMBULATORY_CARE_PROVIDER_SITE_OTHER): Payer: Medicare Other

## 2019-04-22 DIAGNOSIS — E034 Atrophy of thyroid (acquired): Secondary | ICD-10-CM

## 2019-04-22 DIAGNOSIS — R944 Abnormal results of kidney function studies: Secondary | ICD-10-CM | POA: Diagnosis not present

## 2019-04-22 LAB — TSH: TSH: 4.02 u[IU]/mL (ref 0.35–4.50)

## 2019-04-22 LAB — BASIC METABOLIC PANEL
BUN: 12 mg/dL (ref 6–23)
CO2: 29 mEq/L (ref 19–32)
Calcium: 8.9 mg/dL (ref 8.4–10.5)
Chloride: 104 mEq/L (ref 96–112)
Creatinine, Ser: 0.89 mg/dL (ref 0.40–1.20)
GFR: 64.12 mL/min (ref >60.00)
Glucose, Bld: 131 mg/dL — ABNORMAL HIGH (ref 70–99)
Potassium: 4.4 mEq/L (ref 3.5–5.1)
Sodium: 142 mEq/L (ref 135–145)

## 2019-04-26 ENCOUNTER — Telehealth: Payer: Self-pay | Admitting: Family Medicine

## 2019-04-26 DIAGNOSIS — I10 Essential (primary) hypertension: Secondary | ICD-10-CM

## 2019-04-26 MED ORDER — LOSARTAN POTASSIUM 100 MG PO TABS: 100.0000 mg | ORAL_TABLET | Freq: Every day | ORAL | 1 refills | Status: DC

## 2019-04-26 NOTE — Telephone Encounter (Signed)
Left detailed message making aware that Rx has been refilled to [facility].  Nothing further needed.

## 2019-04-26 NOTE — Telephone Encounter (Signed)
Patient called requesting a refill She stated she was advised by the pharmacy to contact us that they have been reaching out to [place] 4 or 5 times and have not received a response   She is needing her LOSARTAN 100mg   [place]

## 2019-04-28 ENCOUNTER — Telehealth: Payer: Self-pay | Admitting: Family Medicine

## 2019-04-28 NOTE — Telephone Encounter (Signed)
See result note.  

## 2019-04-28 NOTE — Telephone Encounter (Signed)
Pt returned your call.  

## 2019-05-05 ENCOUNTER — Encounter: Payer: Self-pay | Admitting: Family Medicine

## 2019-05-05 ENCOUNTER — Telehealth: Payer: Self-pay

## 2019-05-05 ENCOUNTER — Ambulatory Visit (INDEPENDENT_AMBULATORY_CARE_PROVIDER_SITE_OTHER): Payer: Medicare Other | Admitting: Family Medicine

## 2019-05-05 VITALS — Ht 61.0 in | Wt 200.0 lb

## 2019-05-05 DIAGNOSIS — J309 Allergic rhinitis, unspecified: Secondary | ICD-10-CM

## 2019-05-05 DIAGNOSIS — R21 Rash and other nonspecific skin eruption: Secondary | ICD-10-CM | POA: Diagnosis not present

## 2019-05-05 NOTE — Progress Notes (Signed)
Virtual Visit via Video Note  I connected with Virginia Romero on 05/05/19 at  2:00 PM EST by a video enabled telemedicine application and verified that I am speaking with the correct person using two identifiers.  Location: Patient: At her home Provider: [facility]   I discussed the limitations of evaluation and management by telemedicine and the availability of in person appointments. The patient expressed understanding and agreed to proceed.  History of Present Illness: Chief Complaint  Patient presents with  . Nasal Congestion    for a coupleof  weeks  . Rash    ? bites on both arms   This is a 63 yo female who presents today for virtual visit for the above cc.   Nasal congestion-she has had several weeks of off and on nasal congestion with clear watery drainage and sneezing.  Occasional mild headache that she has not taken anything for.  No fever.  Rare dry cough.  No chest congestion.  No shortness of breath.  Has not taken anything for symptoms.  Wonders if she has hayfever?  Rash-she has about 10-15 raised red bumps on her arms.  They have been there for a couple of weeks and are not increasing in number.  They are occasionally itchy.  When they appeared, she was doing quite a bit of yard work.  She wonders if she has bug bites.  She has not put anything on them.  She is concerned about symptoms for Covid and has plans to visit her son upcoming but is concerned about catching or transmitting Covid.  She has basically been at home and wears a mask anytime she leaves her house.  She is very lonely and isolated and misses seeing her son terribly.  Past Medical History:  Diagnosis Date  . Anxiety   . Arthritis    KNEES AND LOWER BACK   . Cancer (HCC)    THYROID  . 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 Depression   . Diabetes mellitus    Type II  . Endometrial polyp   . GERD (gastroesophageal reflux disease)   .  History of ovarian cyst   . Hyperlipemia   . Hypertension   . Osteopenia 09/2016   T score -1.5  . PTSD (post-traumatic stress disorder)   . Shortness of breath dyspnea    with anxiety  . Thyroid disease    Past Surgical History:  Procedure Laterality Date  . CESAREAN SECTION  '79 AND '86   X2  . COLONOSCOPY     with polypectomy  . DIAGNOSTIC LAPAROSCOPY  07/17/2006   WITH LYSIS OF PERITUBULAR AND PERIOVARIAN ADHESIONS, LYSIS OF OMENTAL ADHESIONS, LSO, RIGHT  OVARIAN CYSTECTOMY , HYSTEROSCOPY  WITH EXCISION OF ENDOMETRIAL POLYP...  . ORIF HUMERUS FRACTURE Left 03/01/2015   Procedure: OPEN REDUCTION INTERNAL FIXATION (ORIF) PROXIMAL HUMERUS FRACTURE;  Surgeon: Netta Cedars, MD;  Location: [place];  Service: Orthopedics;  Laterality: Left;  . THYROIDECTOMY  2000  . TUBAL LIGATION     Family History  Problem Relation Age of Onset  . Hypertension Mother   . Heart disease Mother   . Diabetes Brother   . Hypertension Maternal Grandmother   . Heart disease Maternal Grandmother   . Diabetes Maternal Grandfather   . Depression Daughter   . Diabetes Paternal Grandfather    Social History   Tobacco Use  . Smoking status: Current Every Day Smoker  Packs/day: 0.50    Years: 30.00    Pack years: 15.00    Types: Cigarettes  . Smokeless tobacco: Never Used  Substance Use Topics  . Alcohol use: No  . Drug use: No      Observations/Objective: The patient is alert and answers questions appropriately.  Respirations are even and unlabored.  She ambulates during her visit without difficulty.  There is no audible wheeze or witnessed cough.  Mood and affect are appropriate.  Despite patient's best efforts, unable to get a good look at her lesions on her arms.  I had a blurry shot of discrete erythematous raised appearing lesion. Ht 5\' 1"  (1.549 m)   Wt 200 lb (90.7 kg)   BMI 37.79 kg/m  Wt Readings from Last 3 Encounters:  05/05/19 200 lb (90.7 kg)  01/13/19 209 lb (94.8 kg)  07/15/18  210 lb 1.6 oz (95.3 kg)    Assessment and Plan: 1. Allergic rhinitis, unspecified seasonality, unspecified trigger -Discussed signs symptoms of infection which she does not have. -Recommended over-the-counter long-acting antihistamine such as loratadine or cetirizine -Follow-up if no improvement with above  2. Rash -Unable to get a good look at her lesions but they have been there for a couple of weeks and are not increasing in number so suspect these are related to contact or bug bite -Suggested over-the-counter hydrocortisone cream -Follow-up if no improvement   Clarene Reamer, FNP-BC  [facility], [place]  05/05/2019 2:20 PM   Follow Up Instructions:    I discussed the assessment and treatment plan with the patient. The patient was provided an opportunity to ask questions and all were answered. The patient agreed with the plan and demonstrated an understanding of the instructions.   The patient was advised to call back or seek an in-person evaluation if the symptoms worsen or if the condition fails to improve as anticipated.    Elby Beck, FNP

## 2019-05-05 NOTE — Telephone Encounter (Signed)
Pt is planning to travel to [place] by car and staying with her son who has a roommate that just had stent put in his heart. Pt wants to know if safe to travel and stay with her son and his roommate. Pt has covid symptoms of runny nose and bumps ? Rash on both arms for 2 wks; no itching; pt said joints always hurt due to arthritis . Pt scheduled virtual appt with Glenda Chroman NP today at 2 PM. Pt does not have a way to ck any vitals or her wt.

## 2019-05-05 NOTE — Telephone Encounter (Signed)
Noted  

## 2019-06-04 LAB — HM MAMMOGRAPHY

## 2019-06-18 ENCOUNTER — Telehealth: Payer: Self-pay

## 2019-06-18 NOTE — Telephone Encounter (Signed)
Patient l/m on triage line asking Debbie's opinion about patient getting her COVID vaccine. Any reason she should not do that?

## 2019-06-21 ENCOUNTER — Other Ambulatory Visit: Payer: Self-pay | Admitting: Family Medicine

## 2019-06-21 NOTE — Telephone Encounter (Signed)
Patient has been notified

## 2019-06-21 NOTE — Telephone Encounter (Signed)
Please call patient tell her I am in favor of her receiving a Covid vaccination as soon as she is eligible.  Please provide her with information regarding Covid vaccination resources.  I am not sure whether or not she has Internet access.  COVID-19 Vaccine Information can be found at: [email or web address] For questions related to vaccine distribution or appointments, please email [email or web address] or call [phone].

## 2019-07-02 ENCOUNTER — Other Ambulatory Visit: Payer: Self-pay | Admitting: Family Medicine

## 2019-07-02 DIAGNOSIS — F329 Major depressive disorder, single episode, unspecified: Secondary | ICD-10-CM

## 2019-07-02 DIAGNOSIS — F32A Depression, unspecified: Secondary | ICD-10-CM

## 2019-07-02 DIAGNOSIS — F419 Anxiety disorder, unspecified: Secondary | ICD-10-CM

## 2019-07-09 ENCOUNTER — Other Ambulatory Visit: Payer: Self-pay | Admitting: Family Medicine

## 2019-07-09 DIAGNOSIS — F32A Depression, unspecified: Secondary | ICD-10-CM

## 2019-07-09 DIAGNOSIS — F5104 Psychophysiologic insomnia: Secondary | ICD-10-CM

## 2019-07-09 DIAGNOSIS — F329 Major depressive disorder, single episode, unspecified: Secondary | ICD-10-CM

## 2019-07-09 DIAGNOSIS — F419 Anxiety disorder, unspecified: Secondary | ICD-10-CM

## 2019-07-09 NOTE — Telephone Encounter (Signed)
Upcoming appt With Family Medicine Elby Beck, FNP) 07/16/2019 at 8:30 AM  Last OV for anxiety 01/13/2019 Last refill 01/13/2019  Please advise, thanks.

## 2019-07-16 ENCOUNTER — Ambulatory Visit: Payer: Medicare Other | Admitting: Family Medicine

## 2019-07-28 ENCOUNTER — Other Ambulatory Visit: Payer: Self-pay

## 2019-07-28 ENCOUNTER — Encounter: Payer: Self-pay | Admitting: Family Medicine

## 2019-07-28 ENCOUNTER — Ambulatory Visit (INDEPENDENT_AMBULATORY_CARE_PROVIDER_SITE_OTHER): Payer: Medicare Other | Admitting: Family Medicine

## 2019-07-28 VITALS — BP 122/62 | HR 62 | Temp 97.6°F | Ht 61.0 in | Wt 209.1 lb

## 2019-07-28 DIAGNOSIS — E1169 Type 2 diabetes mellitus with other specified complication: Secondary | ICD-10-CM | POA: Diagnosis not present

## 2019-07-28 DIAGNOSIS — E785 Hyperlipidemia, unspecified: Secondary | ICD-10-CM

## 2019-07-28 DIAGNOSIS — Z23 Encounter for immunization: Secondary | ICD-10-CM | POA: Diagnosis not present

## 2019-07-28 DIAGNOSIS — F329 Major depressive disorder, single episode, unspecified: Secondary | ICD-10-CM | POA: Diagnosis not present

## 2019-07-28 DIAGNOSIS — F419 Anxiety disorder, unspecified: Secondary | ICD-10-CM

## 2019-07-28 DIAGNOSIS — I10 Essential (primary) hypertension: Secondary | ICD-10-CM

## 2019-07-28 DIAGNOSIS — Z6839 Body mass index (BMI) 39.0-39.9, adult: Secondary | ICD-10-CM

## 2019-07-28 DIAGNOSIS — F32A Depression, unspecified: Secondary | ICD-10-CM

## 2019-07-28 LAB — LIPID PANEL
Cholesterol: 194 mg/dL (ref 0–200)
HDL: 27.3 mg/dL — ABNORMAL LOW (ref >39.00)
NonHDL: 166.3
Total CHOL/HDL Ratio: 7
Triglycerides: 250 mg/dL — ABNORMAL HIGH (ref 0.0–149.0)
VLDL: 50 mg/dL — ABNORMAL HIGH (ref 0.0–40.0)

## 2019-07-28 LAB — MICROALBUMIN / CREATININE URINE RATIO
Creatinine,U: 357.8 mg/dL
Microalb Creat Ratio: 2.6 mg/g (ref 0.0–30.0)
Microalb, Ur: 9.2 mg/dL — ABNORMAL HIGH (ref 0.0–1.9)

## 2019-07-28 LAB — LDL CHOLESTEROL, DIRECT: Direct LDL: 143 mg/dL

## 2019-07-28 NOTE — Progress Notes (Signed)
Subjective:    Patient ID: Virginia Romero, female    DOB: 1955/07/19, 64 y.o.   MRN: [id number]  HPI Chief Complaint  Patient presents with  . Annual Exam    no new concerns   This is a 64 yo female who presents today for follow up of chronic medical conditions.   Diabetes mellitus- has been fairly well controlled. Last hemoglobin A1c 8/20 was 7.1. She does not check blood sugars at home.   Anxiety and depression- has suffered tremendous loss in the year.  Her brother died unexpectedly.  A couple of months later, his wife, the patient's sister-in-law, intentionally overdosed.  The patient reports difficulty in general with isolation due to the the pandemic.  She is close to her son and a niece who stay in touch with her, but she has not been able to see them regularly.  She has 2 dogs and a cat which keep her going.  Takes duloxetine 60 mg and alprazolam 1 mg 3 times daily.  She reports that she does not always need to 3 times a day, always takes for sleep.  She has a therapist but is unable to afford frequent sessions.  Hypertension-she feels that the combination of metoprolol, hydrochlorothiazide and losartan are working well for her.  She denies side effects.  Improvement with palpitations.  She denies chest pain, shortness of breath.  She has very rare swelling of her legs.     Review of Systems Per HPI    Objective:   Physical Exam Vitals reviewed.  Constitutional:      Appearance: Normal appearance. She is obese.  HENT:     Head: Normocephalic and atraumatic.  Eyes:     Conjunctiva/sclera: Conjunctivae normal.  Cardiovascular:     Rate and Rhythm: Normal rate and regular rhythm.     Heart sounds: Normal heart sounds.  Pulmonary:     Effort: Pulmonary effort is normal.     Breath sounds: Normal breath sounds.  Musculoskeletal:     Cervical back: Normal range of motion and neck supple.     Right lower leg: No edema.     Left lower leg: No edema.  Skin:    General:  Skin is warm and dry.  Neurological:     Mental Status: She is alert and oriented to person, place, and time.  Psychiatric:        Mood and Affect: Mood normal.        Behavior: Behavior normal.        Thought Content: Thought content normal.        Judgment: Judgment normal.       BP 122/62 (BP Location: Right Arm, Patient Position: Sitting, Cuff Size: Large)   Pulse 62   Temp 97.6 F (36.4 C) (Temporal)   Ht 5\' 1"  (1.549 m)   Wt 209 lb 1.9 oz (94.9 kg)   SpO2 96%   BMI 39.51 kg/m  Wt Readings from Last 3 Encounters:  07/28/19 209 lb 1.9 oz (94.9 kg)  05/05/19 200 lb (90.7 kg)  01/13/19 209 lb (94.8 kg)   Diabetic Foot Exam - Simple   Simple Foot Form Diabetic Foot exam was performed with the following findings: Yes 07/28/2019 12:38 PM  Visual Inspection No deformities, no ulcerations, no other skin breakdown bilaterally: Yes Sensation Testing Intact to touch and monofilament testing bilaterally: Yes Pulse Check Posterior Tibialis and Dorsalis pulse intact bilaterally: Yes Comments         Assessment &  Plan:  1. Essential hypertension - well controlled on current medications without side effects - Microalbumin / creatinine urine ratio - Lipid Panel - Pneumococcal polysaccharide vaccine 23-valent greater than or equal to 2yo subcutaneous/IM  2. DM type 2 with diabetic dyslipidemia (HCC) - Microalbumin / creatinine urine ratio - Lipid Panel - Hemoglobin A1c - Pneumococcal polysaccharide vaccine 23-valent greater than or equal to 2yo subcutaneous/IM  3. Need for pneumococcal vaccination - Pneumococcal polysaccharide vaccine 23-valent greater than or equal to 2yo subcutaneous/IM  4. Anxiety and depression -Stable, continue current medications.  She gets therapy as she is able to afford it.  Was looking forward to improvement in weather and loosening of pandemic restrictions so that she can increase her activity and socialization.  5. Class 2 severe obesity due  to excess calories with serious comorbidity and body mass index (BMI) of 39.0 to 39.9 in adult [facility]) -Encouraged increased activity as tolerated and healthy food choices  Follow-up in 6 months This visit occurred during the SARS-CoV-2 public health emergency.  Safety protocols were in place, including screening questions prior to the visit, additional usage of staff PPE, and extensive cleaning of exam room while observing appropriate contact time as indicated for disinfecting solutions.      Clarene Reamer, FNP-BC  [facility] at [facility], [facility]  08/01/2019 1:44 PM

## 2019-07-28 NOTE — Patient Instructions (Signed)
Good to see you today  Please follow up in 6 months   

## 2019-07-29 LAB — HEMOGLOBIN A1C
Hgb A1c MFr Bld: 6.5 % of total Hgb — ABNORMAL HIGH (ref <5.7)
Mean Plasma Glucose: 140 (calc)
eAG (mmol/L): 7.7 (calc)

## 2019-08-02 ENCOUNTER — Other Ambulatory Visit: Payer: Self-pay | Admitting: Family Medicine

## 2019-08-02 ENCOUNTER — Encounter: Payer: Self-pay | Admitting: Family Medicine

## 2019-08-02 DIAGNOSIS — F32A Depression, unspecified: Secondary | ICD-10-CM

## 2019-08-02 DIAGNOSIS — F419 Anxiety disorder, unspecified: Secondary | ICD-10-CM

## 2019-08-02 DIAGNOSIS — F329 Major depressive disorder, single episode, unspecified: Secondary | ICD-10-CM

## 2019-08-02 DIAGNOSIS — F5104 Psychophysiologic insomnia: Secondary | ICD-10-CM

## 2019-08-02 MED ORDER — ROSUVASTATIN CALCIUM 20 MG PO TABS: 20.0000 mg | ORAL_TABLET | Freq: Every day | ORAL | 3 refills | Status: DC

## 2019-08-02 NOTE — Addendum Note (Signed)
Addended by: Clarene Reamer B on: 08/02/2019 09:13 AM   Modules accepted: Orders

## 2019-08-03 NOTE — Telephone Encounter (Signed)
Last OV 07/28/19 Xanax last refilled 07/09/19 #90 x 1 refill Cymbalta last refilled 07/02/19 #30 x 0 refills Upcoming OV With Family Medicine Elby Beck, FNP) 01/24/2020 at 9:30 AM  Please advise, thanks.

## 2019-09-06 ENCOUNTER — Other Ambulatory Visit: Payer: Self-pay | Admitting: Family Medicine

## 2019-09-06 DIAGNOSIS — F5104 Psychophysiologic insomnia: Secondary | ICD-10-CM

## 2019-09-06 DIAGNOSIS — F419 Anxiety disorder, unspecified: Secondary | ICD-10-CM

## 2019-09-06 DIAGNOSIS — F329 Major depressive disorder, single episode, unspecified: Secondary | ICD-10-CM

## 2019-09-06 DIAGNOSIS — F32A Depression, unspecified: Secondary | ICD-10-CM

## 2019-09-06 NOTE — Telephone Encounter (Signed)
Patient was last seen on 07/28/19. Rx was last refilled on 08/04/19 for #90 with 0 refills.  Is this ok to refill?

## 2019-10-04 ENCOUNTER — Other Ambulatory Visit: Payer: Self-pay | Admitting: Family Medicine

## 2019-10-04 DIAGNOSIS — E1169 Type 2 diabetes mellitus with other specified complication: Secondary | ICD-10-CM

## 2019-10-04 DIAGNOSIS — E785 Hyperlipidemia, unspecified: Secondary | ICD-10-CM

## 2019-10-04 DIAGNOSIS — F329 Major depressive disorder, single episode, unspecified: Secondary | ICD-10-CM

## 2019-10-04 DIAGNOSIS — F419 Anxiety disorder, unspecified: Secondary | ICD-10-CM

## 2019-10-04 DIAGNOSIS — F32A Depression, unspecified: Secondary | ICD-10-CM

## 2019-10-04 DIAGNOSIS — F5104 Psychophysiologic insomnia: Secondary | ICD-10-CM

## 2019-10-04 NOTE — Telephone Encounter (Signed)
  Upcoming Appt With Family Medicine Elby Beck, FNP) 01/24/2020 at 9:30 AM Last refilled   Last refilled 09/06/19 #90 x 0 refills.   Please advise, thanks.

## 2019-10-06 NOTE — Telephone Encounter (Signed)
Patient called stating that she is hoping that this refill is done today because she has several other prescriptions to pick up at the pharmacy.

## 2019-11-04 ENCOUNTER — Other Ambulatory Visit: Payer: Self-pay | Admitting: Family Medicine

## 2019-11-04 DIAGNOSIS — F419 Anxiety disorder, unspecified: Secondary | ICD-10-CM

## 2019-11-04 DIAGNOSIS — F32A Depression, unspecified: Secondary | ICD-10-CM

## 2019-11-04 DIAGNOSIS — F5104 Psychophysiologic insomnia: Secondary | ICD-10-CM

## 2019-11-04 NOTE — Telephone Encounter (Signed)
Last OV 07/28/19 Last Refill 10/06/19 #30 x 0 refills Upcoming appointment With Family Medicine Elby Beck, FNP) 01/24/2020 at 9:30 AM

## 2019-11-08 ENCOUNTER — Other Ambulatory Visit: Payer: Self-pay | Admitting: Family Medicine

## 2019-11-08 DIAGNOSIS — I10 Essential (primary) hypertension: Secondary | ICD-10-CM

## 2019-12-07 ENCOUNTER — Telehealth: Payer: Self-pay

## 2019-12-07 NOTE — Telephone Encounter (Signed)
Pt said she has no way to keep appt at a doctor's office or UC. Pt said 06/09/17 pt was seen with same symptoms; pt said prior to 06/09/2017 pt had not had these symptoms and has not had symptoms since 06/09/2017. Pt had [place] 06/09/2017 and was told no blood clot and pt was advised to take nsaid, heat, rest and if not better to cb to [facility]; pt said the pain and symptoms just went away in 2019. Pt wanted to know if could be arthritis or what could have caused these symptoms again after 2 1/2 yrs.pt said pain restarted on 12/04/19, No known injury and no CP or SOB.  Pt said leg behind rt knee has dull achy pain on and off that goes down the rt lower leg at pain level of 6-7. Pt said when pain goes up rt leg that pain is not bad. No swelling, redness or warmth. Pt said if props up leg it does not hurt. Pt said weight bearing is most painful.  Pt having pain now. Pt could not schedule appt or go to UC due to no transportation. Pt said she was going to take ibuprofen 200 mg otc taking 3 tabs q 8 h, rest, elevate leg and use heat on leg. Pt request note to Glenda Chroman FNP to see if has other suggestions. I advised an UC over 2 yrs ago results now could change. UC & ED precautions given  And pt voiced understanding. Pt understands if does not have transportation and condition changes or worsens to call 911 and they will take pt to ED. Pt voiced understanding and will wait for cb.

## 2019-12-08 NOTE — Telephone Encounter (Signed)
Called and spoke to patient. Pain improved significantly with ibuprofen 400 mg x 2 today and ice. No redness or swelling. No overuse or injury. Had severe diarrhea prior to pain in knee starting. Diarrhea resolved now. Instructed to call office if worsening of symptoms, or no further improvement in 5 days.

## 2020-01-01 ENCOUNTER — Other Ambulatory Visit: Payer: Self-pay

## 2020-01-01 ENCOUNTER — Emergency Department (HOSPITAL_COMMUNITY)
Admission: EM | Admit: 2020-01-01 | Discharge: 2020-01-02 | Disposition: A | Discharge status: Home / Self Care | Payer: Medicare Other | Attending: Emergency Medicine | Admitting: Emergency Medicine

## 2020-01-01 DIAGNOSIS — G8929 Other chronic pain: Secondary | ICD-10-CM | POA: Insufficient documentation

## 2020-01-01 DIAGNOSIS — M25461 Effusion, right knee: Secondary | ICD-10-CM | POA: Diagnosis not present

## 2020-01-01 DIAGNOSIS — R21 Rash and other nonspecific skin eruption: Secondary | ICD-10-CM | POA: Diagnosis not present

## 2020-01-01 DIAGNOSIS — M1711 Unilateral primary osteoarthritis, right knee: Secondary | ICD-10-CM | POA: Diagnosis not present

## 2020-01-01 DIAGNOSIS — M25561 Pain in right knee: Secondary | ICD-10-CM | POA: Diagnosis not present

## 2020-01-01 DIAGNOSIS — Z5321 Procedure and treatment not carried out due to patient leaving prior to being seen by health care provider: Secondary | ICD-10-CM | POA: Insufficient documentation

## 2020-01-01 DIAGNOSIS — I10 Essential (primary) hypertension: Secondary | ICD-10-CM | POA: Insufficient documentation

## 2020-01-02 ENCOUNTER — Other Ambulatory Visit: Payer: Self-pay | Admitting: Family Medicine

## 2020-01-02 ENCOUNTER — Encounter (HOSPITAL_COMMUNITY): Payer: Self-pay | Admitting: Emergency Medicine

## 2020-01-02 ENCOUNTER — Other Ambulatory Visit: Payer: Self-pay

## 2020-01-02 ENCOUNTER — Emergency Department (HOSPITAL_COMMUNITY): Payer: Medicare Other

## 2020-01-02 DIAGNOSIS — F5104 Psychophysiologic insomnia: Secondary | ICD-10-CM

## 2020-01-02 DIAGNOSIS — F419 Anxiety disorder, unspecified: Secondary | ICD-10-CM

## 2020-01-02 DIAGNOSIS — M1711 Unilateral primary osteoarthritis, right knee: Secondary | ICD-10-CM | POA: Diagnosis not present

## 2020-01-02 DIAGNOSIS — M25461 Effusion, right knee: Secondary | ICD-10-CM | POA: Diagnosis not present

## 2020-01-02 DIAGNOSIS — F32A Depression, unspecified: Secondary | ICD-10-CM

## 2020-01-02 LAB — CBC WITH DIFFERENTIAL/PLATELET
Abs Immature Granulocytes: 0.04 10*3/uL (ref 0.00–0.07)
Basophils Absolute: 0.1 10*3/uL (ref 0.0–0.1)
Basophils Relative: 1 %
Eosinophils Absolute: 0.3 10*3/uL (ref 0.0–0.5)
Eosinophils Relative: 3 %
HCT: 43.5 % (ref 36.0–46.0)
Hemoglobin: 13.4 g/dL (ref 12.0–15.0)
Immature Granulocytes: 0 %
Lymphocytes Relative: 19 %
Lymphs Abs: 2 10*3/uL (ref 0.7–4.0)
MCH: 26.8 pg (ref 26.0–34.0)
MCHC: 30.8 g/dL (ref 30.0–36.0)
MCV: 87 fL (ref 80.0–100.0)
Monocytes Absolute: 0.5 10*3/uL (ref 0.1–1.0)
Monocytes Relative: 5 %
Neutro Abs: 7.7 10*3/uL (ref 1.7–7.7)
Neutrophils Relative %: 72 %
Platelets: 390 10*3/uL (ref 150–400)
RBC: 5 MIL/uL (ref 3.87–5.11)
RDW: 16.2 % — ABNORMAL HIGH (ref 11.5–15.5)
WBC: 10.6 10*3/uL — ABNORMAL HIGH (ref 4.0–10.5)
nRBC: 0 % (ref 0.0–0.2)

## 2020-01-02 LAB — COMPREHENSIVE METABOLIC PANEL
ALT: 12 U/L (ref 0–44)
AST: 15 U/L (ref 15–41)
Albumin: 4 g/dL (ref 3.5–5.0)
Alkaline Phosphatase: 101 U/L (ref 38–126)
Anion gap: 10 (ref 5–15)
BUN: 7 mg/dL — ABNORMAL LOW (ref 8–23)
CO2: 27 mmol/L (ref 22–32)
Calcium: 9.2 mg/dL (ref 8.9–10.3)
Chloride: 101 mmol/L (ref 98–111)
Creatinine, Ser: 0.93 mg/dL (ref 0.44–1.00)
GFR calc Af Amer: >60 mL/min (ref >60)
GFR calc non Af Amer: >60 mL/min (ref >60)
Glucose, Bld: 148 mg/dL — ABNORMAL HIGH (ref 70–99)
Potassium: 4.3 mmol/L (ref 3.5–5.1)
Sodium: 138 mmol/L (ref 135–145)
Total Bilirubin: 0.5 mg/dL (ref 0.3–1.2)
Total Protein: 7.4 g/dL (ref 6.5–8.1)

## 2020-01-02 NOTE — ED Notes (Signed)
Pt called for vials for no response.

## 2020-01-02 NOTE — ED Triage Notes (Signed)
Patient reports chronic right knee pain for several months worse today with skin rashes at right lower leg , hypertensive at triage , no fever/respirations unlabored .

## 2020-01-03 ENCOUNTER — Ambulatory Visit (INDEPENDENT_AMBULATORY_CARE_PROVIDER_SITE_OTHER): Payer: Medicare Other | Admitting: Family Medicine

## 2020-01-03 ENCOUNTER — Other Ambulatory Visit: Payer: Self-pay

## 2020-01-03 ENCOUNTER — Encounter: Payer: Self-pay | Admitting: Family Medicine

## 2020-01-03 VITALS — BP 150/80 | HR 59 | Temp 97.7°F | Ht 61.0 in | Wt 204.2 lb

## 2020-01-03 DIAGNOSIS — G8929 Other chronic pain: Secondary | ICD-10-CM | POA: Diagnosis not present

## 2020-01-03 DIAGNOSIS — E785 Hyperlipidemia, unspecified: Secondary | ICD-10-CM | POA: Diagnosis not present

## 2020-01-03 DIAGNOSIS — F5104 Psychophysiologic insomnia: Secondary | ICD-10-CM | POA: Diagnosis not present

## 2020-01-03 DIAGNOSIS — I10 Essential (primary) hypertension: Secondary | ICD-10-CM | POA: Diagnosis not present

## 2020-01-03 DIAGNOSIS — F329 Major depressive disorder, single episode, unspecified: Secondary | ICD-10-CM

## 2020-01-03 DIAGNOSIS — Z720 Tobacco use: Secondary | ICD-10-CM

## 2020-01-03 DIAGNOSIS — E1169 Type 2 diabetes mellitus with other specified complication: Secondary | ICD-10-CM | POA: Diagnosis not present

## 2020-01-03 DIAGNOSIS — M25561 Pain in right knee: Secondary | ICD-10-CM | POA: Diagnosis not present

## 2020-01-03 DIAGNOSIS — F419 Anxiety disorder, unspecified: Secondary | ICD-10-CM | POA: Diagnosis not present

## 2020-01-03 DIAGNOSIS — F32A Depression, unspecified: Secondary | ICD-10-CM

## 2020-01-03 LAB — POCT GLYCOSYLATED HEMOGLOBIN (HGB A1C): Hemoglobin A1C: 6.5 % — AB (ref 4.0–5.6)

## 2020-01-03 MED ORDER — ALPRAZOLAM 1 MG PO TABS: 1.0000 mg | ORAL_TABLET | Freq: Three times a day (TID) | ORAL | 1 refills | Status: DC | PRN

## 2020-01-03 MED ORDER — DICLOFENAC SODIUM 1 % EX GEL: 4.0000 g | Freq: Four times a day (QID) | CUTANEOUS | 1 refills | Status: DC | PRN

## 2020-01-03 NOTE — Patient Instructions (Addendum)
Good to see you today  For your knee, I have sent in diclofenac gel- you can use 4x/ day. You can take tylenol up to 4 times a day (500 mg x 2 tablets). A couple of times a week, can take ibuprofen 200 mg tablets x 2.   Heat/ ice as it helps  Let me know if you want a referral to orthopedic specialist  Follow up in 6 months for your complete physical

## 2020-01-03 NOTE — Progress Notes (Signed)
Subjective:    Patient ID: Virginia Romero, female    DOB: 09-Sep-1955, 64 y.o.   MRN: [id number]  HPI Chief Complaint  Patient presents with  . Follow-up    6 month  . Leg Pain    intense x couple weeks    This is a 49 you female who presents today for follow up and to discuss recent er visit.   Diabetes mellitus type 2-she has been maintained on Metformin 500 mg twice daily.  She denies any side effects.  Does not check blood sugars at home.  She has been watching diet and has lost several pounds.  Hypertension-tolerating medications without side effects  Right knee pain-was seen in the ER yesterday for severe right knee pain.  Patient reports that she will have intermittent severe pain of right knee.  Pain along outside and at back of knee.  No weakness or falls.  Has been told in the past that she has arthritis.  Tobacco abuse-smokes approximately 1pack/week.  Review of Systems No chest pain, shortness of breath.  Does feel her heart race if she is anxious or under stress.    Objective:   Physical Exam   BP (!) 156/90   Pulse 59   Temp 97.7 F (36.5 C) (Oral)   Ht 5\' 1"  (1.549 m)   Wt (!) 204 lb 4 oz (92.6 kg)   SpO2 97%   BMI 38.59 kg/m  Wt Readings from Last 3 Encounters:  01/03/20 (!) 204 lb 4 oz (92.6 kg)  01/02/20 (!) 238 lb 1.6 oz (108 kg)  07/28/19 209 lb 1.9 oz (94.9 kg)   BP: (!) 150/80    BP Readings from Last 3 Encounters:  01/03/20 (!) 150/80  01/02/20 (!) 226/98  07/28/19 122/62      Results for orders placed or performed in visit on 01/03/20  POCT glycosylated hemoglobin (Hb A1C)  Result Value Ref Range   Hemoglobin A1C 6.5 (A) 4.0 - 5.6 %   HbA1c POC (<> result, manual entry)     HbA1c, POC (prediabetic range)     HbA1c, POC (controlled diabetic range)     Depression screen [facility] 2/9 01/03/2020 01/13/2019 07/15/2018 06/15/2018 12/12/2017  Decreased Interest 1 3 2  - 3  Down, Depressed, Hopeless 1 3 2 2 3   PHQ - 2 Score 2 6 4 2 6   Altered  sleeping 1 2 2 2 3   Tired, decreased energy 0 2 1 2 3   Change in appetite 0 2 2 2 3   Feeling bad or failure about yourself  1 3 2 2 3   Trouble concentrating 0 0 1 2 3   Moving slowly or fidgety/restless 0 1 1 2 2   Suicidal thoughts 0 1 0 2 2  PHQ-9 Score 4 17 13 16 25   Difficult doing work/chores Not difficult at all - Somewhat difficult Somewhat difficult Somewhat difficult  Some recent data might be hidden     Assessment & Plan:  1. DM type 2 with diabetic dyslipidemia (HCC) -Blood sugar well controlled with hemoglobin A1c 6.5 - POCT glycosylated hemoglobin (Hb A1C)  2. Anxiety and depression -She reports that it is about the same.  PHQ-9 score less today than last year. -She reports good relief with alprazolam 1 mg twice a day with occasional third dose. -She is also currently on citalopram 40 mg.  She has had counseling in the past but has difficulty affording her co-pay. - ALPRAZolam (XANAX) 1 MG tablet; Take 1 tablet (1  mg total) by mouth 3 (three) times daily as needed. for anxiety  Dispense: 90 tablet; Refill: 1  3. Psychophysiological insomnia -She reports that she is able to sleep well, 7 to 8 hours at night if she takes her alprazolam - ALPRAZolam (XANAX) 1 MG tablet; Take 1 tablet (1 mg total) by mouth 3 (three) times daily as needed. for anxiety  Dispense: 90 tablet; Refill: 1  4. Chronic pain of right knee -Reviewed her knee x-ray that was done in the ER yesterday with her.  She has severe degenerative osteoarthritis.  Discussed treatment options and encouraged her to consider seeing orthopedics for possible injection or to discuss knee replacement.  She is not interested in either of these at this time.  She would like to try more conservative therapies.  We will try diclofenac gel and discussed over-the-counter analgesic use and provided written guidelines. -She will let me know if she changes her mind and would like a referral to orthopedics - diclofenac Sodium  (VOLTAREN) 1 % GEL; Apply 4 g topically 4 (four) times daily as needed.  Dispense: 100 g; Refill: 1  5. Tobacco abuse -She has significantly decreased her consumption and I encouraged her to consider quitting or at least continue to decrease amount that she smokes  6. Essential hypertension -Blood pressure better today on recheck.  Will continue to monitor.  -Follow-up in 6 months  This visit occurred during the SARS-CoV-2 public health emergency.  Safety protocols were in place, including screening questions prior to the visit, additional usage of staff PPE, and extensive cleaning of exam room while observing appropriate contact time as indicated for disinfecting solutions.      Clarene Reamer, FNP-BC  [facility] at [facility], [place] [facility]  01/03/2020 2:06 PM

## 2020-01-15 ENCOUNTER — Other Ambulatory Visit: Payer: Self-pay | Admitting: Family Medicine

## 2020-01-17 NOTE — Telephone Encounter (Signed)
Last prescribed on 10/04/2019 Last OV (follow up ) with Tor Netters on 01/03/2020 Future OV scheduled on 07/10/2020

## 2020-01-24 ENCOUNTER — Ambulatory Visit: Payer: Medicare Other | Admitting: Family Medicine

## 2020-02-16 ENCOUNTER — Other Ambulatory Visit: Payer: Self-pay | Admitting: Family Medicine

## 2020-02-16 DIAGNOSIS — F419 Anxiety disorder, unspecified: Secondary | ICD-10-CM

## 2020-02-16 DIAGNOSIS — F32A Depression, unspecified: Secondary | ICD-10-CM

## 2020-02-16 DIAGNOSIS — E1169 Type 2 diabetes mellitus with other specified complication: Secondary | ICD-10-CM

## 2020-02-16 DIAGNOSIS — E785 Hyperlipidemia, unspecified: Secondary | ICD-10-CM

## 2020-02-29 ENCOUNTER — Other Ambulatory Visit: Payer: Self-pay | Admitting: Family Medicine

## 2020-02-29 DIAGNOSIS — E034 Atrophy of thyroid (acquired): Secondary | ICD-10-CM

## 2020-03-02 ENCOUNTER — Other Ambulatory Visit: Payer: Self-pay | Admitting: Family Medicine

## 2020-03-02 DIAGNOSIS — F5104 Psychophysiologic insomnia: Secondary | ICD-10-CM

## 2020-03-02 DIAGNOSIS — F419 Anxiety disorder, unspecified: Secondary | ICD-10-CM

## 2020-03-02 DIAGNOSIS — F32A Depression, unspecified: Secondary | ICD-10-CM

## 2020-03-02 NOTE — Telephone Encounter (Signed)
Last refill 01/03/2020. Last office visit 01/03/2020.  Upcoming appt 07/07/2020 Medicare Wellness

## 2020-04-03 ENCOUNTER — Encounter: Payer: Self-pay | Admitting: Family Medicine

## 2020-04-10 ENCOUNTER — Encounter: Payer: Self-pay | Admitting: Family Medicine

## 2020-04-17 ENCOUNTER — Ambulatory Visit: Payer: Medicare Other | Admitting: Family Medicine

## 2020-04-17 ENCOUNTER — Telehealth: Payer: Self-pay | Admitting: *Deleted

## 2020-04-17 DIAGNOSIS — Z23 Encounter for immunization: Secondary | ICD-10-CM | POA: Diagnosis not present

## 2020-04-17 NOTE — Telephone Encounter (Signed)
Noted will see tomorrow 

## 2020-04-17 NOTE — Telephone Encounter (Signed)
Patient called wanting to know if she can go ahead and get her flu vaccine since she had her last covid vaccine in April. Patient was given information on vaccine.  Patient stated that she has a rash on her left left and has used several items over the counter treatments which has not helped. Patient stated that she has been using neosporin for a while and it has not helped. Patient stated that she is concerned that her leg may be infected and is concerned because she has diabetes. Patient scheduled to see Dr. Einar Pheasant today since her PCP does not have any available appointments today. Patient had a negative covid screening.

## 2020-04-18 ENCOUNTER — Other Ambulatory Visit: Payer: Self-pay

## 2020-04-18 ENCOUNTER — Ambulatory Visit (INDEPENDENT_AMBULATORY_CARE_PROVIDER_SITE_OTHER): Payer: Medicare Other | Admitting: Family Medicine

## 2020-04-18 VITALS — BP 140/90 | HR 65 | Temp 95.3°F | Wt 208.5 lb

## 2020-04-18 DIAGNOSIS — R21 Rash and other nonspecific skin eruption: Secondary | ICD-10-CM | POA: Diagnosis not present

## 2020-04-18 MED ORDER — MUPIROCIN 2 % EX OINT: 1.0000 "application " | TOPICAL_OINTMENT | Freq: Two times a day (BID) | CUTANEOUS | 0 refills | Status: DC

## 2020-04-18 NOTE — Patient Instructions (Signed)
Try the mupirocin ointment twice daily  If no improvement or new worsening spots -- call back and will place dermatology referral

## 2020-04-18 NOTE — Assessment & Plan Note (Signed)
Etiology not entirely clear - suspect some excoriations from earlier pruritic rash. Will treat with mupirocin and recommend derm if worsening or not improved.

## 2020-04-18 NOTE — Progress Notes (Signed)
   Subjective:     Virginia Romero is a 64 y.o. female presenting for Rash (mostly on L leg. doesn't itch )     HPI   #Rash - symptoms started 1 month ago - has been using neosporin and peroxide - has diabetes  - was itchy, but no longer itchy - no pain  - thinks she may be scratching it at night  Lab Results  Component Value Date   HGBA1C 6.5 (A) 01/03/2020      Review of Systems   Social History   Tobacco Use  Smoking Status Current Every Day Smoker  . Packs/day: 0.50  . Years: 30.00  . Pack years: 15.00  . Types: Cigarettes  Smokeless Tobacco Never Used        Objective:    BP Readings from Last 3 Encounters:  04/18/20 140/90  01/03/20 (!) 150/80  01/02/20 (!) 226/98   Wt Readings from Last 3 Encounters:  04/18/20 208 lb 8 oz (94.6 kg)  01/03/20 (!) 204 lb 4 oz (92.6 kg)  01/02/20 (!) 238 lb 1.6 oz (108 kg)    BP 140/90   Pulse 65   Temp (!) 95.3 F (35.2 C) (Temporal)   Wt 208 lb 8 oz (94.6 kg)   SpO2 98%   BMI 39.40 kg/m    Physical Exam Constitutional:      General: She is not in acute distress.    Appearance: She is well-developed. She is not diaphoretic.  HENT:     Right Ear: External ear normal.     Left Ear: External ear normal.     Nose: Nose normal.  Eyes:     Conjunctiva/sclera: Conjunctivae normal.  Cardiovascular:     Rate and Rhythm: Normal rate.  Pulmonary:     Effort: Pulmonary effort is normal.  Musculoskeletal:     Cervical back: Neck supple.  Skin:    General: Skin is warm and dry.     Capillary Refill: Capillary refill takes less than 2 seconds.     Comments: Left LE with scattered excoriations with mild erythema in various states of healing scabs. Also with some scattered purple macular lesions. No ttp. No erythema  Neurological:     Mental Status: She is alert. Mental status is at baseline.  Psychiatric:        Mood and Affect: Mood normal.        Behavior: Behavior normal.           Assessment &  Plan:   Problem List Items Addressed This Visit      Musculoskeletal and Integument   Rash - Primary    Etiology not entirely clear - suspect some excoriations from earlier pruritic rash. Will treat with mupirocin and recommend derm if worsening or not improved.       Relevant Medications   mupirocin ointment (BACTROBAN) 2 %       Return if symptoms worsen or fail to improve.  Lesleigh Noe, MD  This visit occurred during the SARS-CoV-2 public health emergency.  Safety protocols were in place, including screening questions prior to the visit, additional usage of staff PPE, and extensive cleaning of exam room while observing appropriate contact time as indicated for disinfecting solutions.

## 2020-04-24 ENCOUNTER — Other Ambulatory Visit: Payer: Self-pay | Admitting: Family Medicine

## 2020-04-24 DIAGNOSIS — R21 Rash and other nonspecific skin eruption: Secondary | ICD-10-CM

## 2020-04-25 ENCOUNTER — Telehealth: Payer: Self-pay | Admitting: *Deleted

## 2020-04-25 DIAGNOSIS — R21 Rash and other nonspecific skin eruption: Secondary | ICD-10-CM

## 2020-04-25 MED ORDER — MUPIROCIN 2 % EX OINT: 1.0000 "application " | TOPICAL_OINTMENT | Freq: Two times a day (BID) | CUTANEOUS | 0 refills | Status: DC

## 2020-04-25 NOTE — Telephone Encounter (Signed)
Refill provided.   If not resolved at the end of second tube would recommend re-evaluation.

## 2020-04-25 NOTE — Telephone Encounter (Signed)
Patient left a voicemail stating that she saw Dr. Einar Pheasant a few weeks back when Tor Netters NP was not available. Patient stated that she saw her for a rash and was given an ointment. Patient stated that the rash has not spread or gotten worse, but is not completely cleared up. Patient wants to know if Dr. Einar Pheasant will give her a refill on the ointment since rash is still there. Pharmacy [facility]

## 2020-04-28 ENCOUNTER — Other Ambulatory Visit: Payer: Self-pay | Admitting: Family Medicine

## 2020-04-28 DIAGNOSIS — F5104 Psychophysiologic insomnia: Secondary | ICD-10-CM

## 2020-04-28 DIAGNOSIS — F419 Anxiety disorder, unspecified: Secondary | ICD-10-CM

## 2020-04-28 DIAGNOSIS — F32A Depression, unspecified: Secondary | ICD-10-CM

## 2020-05-01 NOTE — Telephone Encounter (Signed)
Pharmacy requests refill on: Alprazolam 1 mg   LAST REFILL: 03/02/2020 LAST OV: 04/18/2020 NEXT OV: 07/11/2020 PHARMACY: [place], [place]

## 2020-05-02 ENCOUNTER — Telehealth: Payer: Self-pay

## 2020-05-02 NOTE — Telephone Encounter (Signed)
Rx sent to patient's pharmacy

## 2020-05-02 NOTE — Telephone Encounter (Signed)
Received fax from [facility], requesting refill of xanax, 1mg , TID, #90.  Name of Medication: xanax Name of Pharmacy: [facility], [place] or Written Date and Quantity: 03/31/20, 41 Last Office Visit and Type: 01/03/20 Next Office Visit and Type: 07/12/19 Last Controlled Substance Agreement Date: unknown Last UDS: unknown

## 2020-05-03 DIAGNOSIS — Z23 Encounter for immunization: Secondary | ICD-10-CM | POA: Diagnosis not present

## 2020-05-28 ENCOUNTER — Other Ambulatory Visit: Payer: Self-pay | Admitting: Family Medicine

## 2020-05-28 DIAGNOSIS — I10 Essential (primary) hypertension: Secondary | ICD-10-CM

## 2020-05-29 NOTE — Telephone Encounter (Signed)
Pharmacy requests refill on: Losartan 100 mg   LAST REFILL: 11/09/2019 (Q-90, R-1) LAST OV: 04/18/2020 NEXT OV: 07/14/2020 PHARMACY: [id number] [place], [place]

## 2020-06-03 DIAGNOSIS — R195 Other fecal abnormalities: Secondary | ICD-10-CM

## 2020-06-03 HISTORY — DX: Other fecal abnormalities: R19.5

## 2020-06-28 ENCOUNTER — Telehealth: Payer: Self-pay

## 2020-06-28 DIAGNOSIS — E034 Atrophy of thyroid (acquired): Secondary | ICD-10-CM

## 2020-06-28 MED ORDER — LEVOTHYROXINE SODIUM 150 MCG PO TABS: 150.0000 ug | ORAL_TABLET | Freq: Every day | ORAL | 0 refills | Status: DC

## 2020-06-28 NOTE — Telephone Encounter (Signed)
refilled 

## 2020-06-28 NOTE — Telephone Encounter (Signed)
Pharmacy requests refill on: Levothyroxine 150 mcg   LAST REFILL: 02/29/2020 (Q-90, R-0) LAST OV: 04/18/2020 NEXT OV: 08/22/2020 PHARMACY: [place], [place]  TSH (04/22/2019):4.02   Ok to refill or would you like to set up a lab appointment to re-check TSH?

## 2020-06-28 NOTE — Telephone Encounter (Signed)
Routing to provider who will be new PCP

## 2020-06-28 NOTE — Addendum Note (Signed)
Addended by: Jearld Fenton on: 06/28/2020 02:57 PM   Modules accepted: Orders

## 2020-06-29 ENCOUNTER — Telehealth: Payer: Self-pay

## 2020-06-29 MED ORDER — METOPROLOL TARTRATE 100 MG PO TABS: 100.0000 mg | ORAL_TABLET | Freq: Two times a day (BID) | ORAL | 0 refills | Status: DC

## 2020-06-29 NOTE — Telephone Encounter (Signed)
Pharmacy requests refill on: Metoprolol Tartrate 100 mg   LAST REFILL: 01/17/2020 (Q-180, R-1) LAST OV: 04/18/2020 NEXT OV: 08/22/2020 PHARMACY: [place], [place]

## 2020-07-07 ENCOUNTER — Ambulatory Visit: Payer: Medicare Other

## 2020-07-10 ENCOUNTER — Encounter: Payer: Medicare Other | Admitting: Family Medicine

## 2020-07-11 ENCOUNTER — Encounter: Payer: Medicare Other | Admitting: Family Medicine

## 2020-07-14 ENCOUNTER — Encounter: Payer: Medicare Other | Admitting: Internal Medicine

## 2020-07-25 ENCOUNTER — Other Ambulatory Visit: Payer: Self-pay | Admitting: *Deleted

## 2020-07-25 DIAGNOSIS — F5104 Psychophysiologic insomnia: Secondary | ICD-10-CM

## 2020-07-25 DIAGNOSIS — F419 Anxiety disorder, unspecified: Secondary | ICD-10-CM

## 2020-07-25 DIAGNOSIS — F32A Depression, unspecified: Secondary | ICD-10-CM

## 2020-07-25 NOTE — Telephone Encounter (Signed)
Last office visit 04/18/2020 with Dr. Einar Pheasant for rash.  Last refilled 05/02/2020 for #90 with 2 refills. TOC scheduled with R. Baity on 08/22/2020.

## 2020-07-25 NOTE — Telephone Encounter (Cosign Needed)
I will not fill this until her establish care appt

## 2020-07-26 MED ORDER — ALPRAZOLAM 1 MG PO TABS: 1.0000 mg | ORAL_TABLET | Freq: Three times a day (TID) | ORAL | 0 refills | Status: DC | PRN

## 2020-07-26 NOTE — Telephone Encounter (Signed)
I sent 1 refill to allow time to establish care.  Thanks.

## 2020-08-08 ENCOUNTER — Telehealth: Payer: Self-pay

## 2020-08-08 NOTE — Telephone Encounter (Signed)
Noted  

## 2020-08-08 NOTE — Telephone Encounter (Signed)
Pt received call from front desk about establishing with outside office since Glenda Chroman FNP and Avie Echevaria NP is not going to be at [facility]. Pt got very upset that she does not have a doctor and told Morey Hummingbird that an ambulance came out and BP was 267/150. Pt's son took her to ED but wait was over 11 hrs and pt decided to go home. This occurred at Thanksgiving 2021.Pt has not missed any BP med; pt taking losartan 100 mg one daily and metoprolol tartrate 100 mg taking one tab bid and alprazolam 1 mg taking one tab tid prn; pt said last month she has been taking one tab tid daily due to things going on in her life. Pt has not taken her BP since was taken at ED in late Nov. 2021. Today pt has no H/a,dizziness,CP,SOB, or any blurred vision or any changes in her vision. Pt said she does not have a way to ck BP at home. Pt said right now she does not have transportation due to her car is smoking. Offered appt to have pts BP eval and any med refills  Until can get established at another outside office on 08/09/20; pt declined and said she does not feel like getting out this week and her daughter cannot bring her to the office on 08/25/20 and 08/28/20. I will speak with Dr Diona Browner about scheduling appt on 08/14/20 for BP ck and possible med refill and then I will call pt back about appt.UC & ED precautions given and pt voiced understanding. Mandy RN advised if pt can to get BP cuff and ck BP at home and also to remind pt that we can only do this appt one more time and that pt needs to schedule with new provider outside our office.will speak with Dr Diona Browner when she is available and call pt back.

## 2020-08-08 NOTE — Telephone Encounter (Signed)
I spoke with Dr Diona Browner and she will see pt on 08/14/20; I spoke with pt to advise that Dr Diona Browner will see pt in office (no covid per pt) to ck BP and to ck on any needed refills until pt can get established in another outside office; pt voiced understanding and then asked if her diabetes and depression and anxiety would be checked. I advised pt as we spoke earlier this appt would be to ck BP and make sure if pt needed refills until could get established with outside office. If pt has any problems or concerns about diabetes and psychological issues should go to UC or ED. Pt said she was OK with both of those right now but was just wondering. UC & ED precautions given and pt voiced understanding. Sending note to Dr Diona Browner.

## 2020-08-14 ENCOUNTER — Other Ambulatory Visit: Payer: Self-pay

## 2020-08-14 ENCOUNTER — Encounter: Payer: Self-pay | Admitting: Family Medicine

## 2020-08-14 ENCOUNTER — Ambulatory Visit (INDEPENDENT_AMBULATORY_CARE_PROVIDER_SITE_OTHER): Payer: Medicare Other | Admitting: Family Medicine

## 2020-08-14 VITALS — BP 180/90 | HR 69 | Temp 97.5°F | Ht 61.0 in | Wt 209.2 lb

## 2020-08-14 DIAGNOSIS — E559 Vitamin D deficiency, unspecified: Secondary | ICD-10-CM | POA: Diagnosis not present

## 2020-08-14 DIAGNOSIS — E1169 Type 2 diabetes mellitus with other specified complication: Secondary | ICD-10-CM

## 2020-08-14 DIAGNOSIS — I10 Essential (primary) hypertension: Secondary | ICD-10-CM

## 2020-08-14 DIAGNOSIS — Z720 Tobacco use: Secondary | ICD-10-CM | POA: Diagnosis not present

## 2020-08-14 DIAGNOSIS — F411 Generalized anxiety disorder: Secondary | ICD-10-CM

## 2020-08-14 DIAGNOSIS — E785 Hyperlipidemia, unspecified: Secondary | ICD-10-CM

## 2020-08-14 DIAGNOSIS — F331 Major depressive disorder, recurrent, moderate: Secondary | ICD-10-CM

## 2020-08-14 MED ORDER — HYDROCHLOROTHIAZIDE 25 MG PO TABS: 25.0000 mg | ORAL_TABLET | Freq: Every day | ORAL | 3 refills | Status: DC

## 2020-08-14 MED ORDER — DULOXETINE HCL 30 MG PO CPEP: 90.0000 mg | ORAL_CAPSULE | Freq: Every day | ORAL | 3 refills | Status: DC

## 2020-08-14 MED ORDER — CLONIDINE HCL 0.1 MG PO TABS
0.2000 mg | ORAL_TABLET | Freq: Once | ORAL | Status: AC
  Administered 2020-08-14: 0.2 mg via ORAL

## 2020-08-14 NOTE — Progress Notes (Addendum)
Patient ID: Virginia Romero, female    DOB: [birth year], 65 y.o.   MRN: [id number]  This visit was conducted in person.  BP (!) 220/110   Pulse 69   Temp (!) 97.5 F (36.4 C) (Temporal)   Ht 5\' 1"  (1.549 m)   Wt 209 lb 4 oz (94.9 kg)   SpO2 97%   BMI 39.54 kg/m    CC:  Chief Complaint  Patient presents with  . Hypertension    Subjective:   HPI: Virginia Romero is a 65 y.o. female presenting on 08/14/2020 for Hypertension  Hypertension:    Poor control despite losartan 100 mg daily   and metoprolol 100 mg twice daily. She took this medicaiton along with alprazolam 1/2 tab today as she has a lot of issues with anxiety, PTSD.  HR 69. She has been having elevated blood pressure off and on  since 04/2020.  She is upset today given Tor Netters has left the office.. she needs new PCP.  She is not taking HCTZ BP Readings from Last 3 Encounters:  08/14/20 (!) 220/110  04/18/20 140/90  01/03/20 (!) 150/80  Using medication without problems or lightheadedness:  none Chest pain with exertion:none Edema:none Short of breath:none Average home BPs: no cuff  no numbness, no weakness, no headache, no vision change, no slurred speech.   She feels that Anxiety is not well controlled.. on Cymbalta 60 mg daily. She is very anxious cannot relax. She  Is using alprazolam 1 mg at bedtime, occ taking 1 extra tablet at night. GAD7 19 PHQ9 18  Dog died last week. Brother died last year. Under a lot of stress. Other issues:       Relevant past medical, surgical, family and social history reviewed and updated as indicated. Interim medical history since our last visit reviewed. Allergies and medications reviewed and updated. Outpatient Medications Prior to Visit  Medication Sig Dispense Refill  . ALPRAZolam (XANAX) 1 MG tablet Take 1 tablet (1 mg total) by mouth 3 (three) times daily as needed. for anxiety 90 tablet 0  . diclofenac Sodium (VOLTAREN) 1 % GEL Apply 4 g topically 4 (four)  times daily as needed. 100 g 1  . DULoxetine (CYMBALTA) 60 MG capsule Take 1 capsule by mouth once daily 90 capsule 1  . hydrochlorothiazide (MICROZIDE) 12.5 MG capsule TAKE 1 CAPSULE BY MOUTH ONCE DAILY 90 capsule 1  . levothyroxine (SYNTHROID) 150 MCG tablet Take 1 tablet (150 mcg total) by mouth daily before breakfast. 90 tablet 0  . losartan (COZAAR) 100 MG tablet Take 1 tablet by mouth once daily 90 tablet 1  . metFORMIN (GLUCOPHAGE) 500 MG tablet TAKE 1 TABLET BY MOUTH TWICE DAILY WITH A MEAL 180 tablet 1  . metoprolol tartrate (LOPRESSOR) 100 MG tablet Take 1 tablet (100 mg total) by mouth 2 (two) times daily. 180 tablet 0  . mupirocin ointment (BACTROBAN) 2 % Apply 1 application topically 2 (two) times daily. 30 g 0  . rosuvastatin (CRESTOR) 20 MG tablet Take 1 tablet (20 mg total) by mouth daily. 90 tablet 3   No facility-administered medications prior to visit.     Per HPI unless specifically indicated in ROS section below Review of Systems  Constitutional: Negative for fatigue and fever.  HENT: Negative for congestion.   Eyes: Negative for pain.  Respiratory: Negative for cough and shortness of breath.   Cardiovascular: Negative for chest pain, palpitations and leg swelling.  Gastrointestinal: Negative for abdominal pain.  Genitourinary: Negative for dysuria and vaginal bleeding.  Musculoskeletal: Negative for back pain.  Neurological: Negative for syncope, light-headedness and headaches.  Psychiatric/Behavioral: Negative for dysphoric mood. The patient is nervous/anxious.    Objective:  BP (!) 220/110   Pulse 69   Temp (!) 97.5 F (36.4 C) (Temporal)   Ht 5\' 1"  (1.549 m)   Wt 209 lb 4 oz (94.9 kg)   SpO2 97%   BMI 39.54 kg/m   Wt Readings from Last 3 Encounters:  08/14/20 209 lb 4 oz (94.9 kg)  04/18/20 208 lb 8 oz (94.6 kg)  01/03/20 (!) 204 lb 4 oz (92.6 kg)      Physical Exam Constitutional:      General: She is not in acute distress.    Appearance: Normal  appearance. She is well-developed. She is obese. She is not ill-appearing or toxic-appearing.  HENT:     Head: Normocephalic.     Right Ear: Hearing, tympanic membrane, ear canal and external ear normal. Tympanic membrane is not erythematous, retracted or bulging.     Left Ear: Hearing, tympanic membrane, ear canal and external ear normal. Tympanic membrane is not erythematous, retracted or bulging.     Nose: No mucosal edema or rhinorrhea.     Right Sinus: No maxillary sinus tenderness or frontal sinus tenderness.     Left Sinus: No maxillary sinus tenderness or frontal sinus tenderness.     Mouth/Throat:     Pharynx: Uvula midline.  Eyes:     General: Lids are normal. Lids are everted, no foreign bodies appreciated.     Conjunctiva/sclera: Conjunctivae normal.     Pupils: Pupils are equal, round, and reactive to light.  Neck:     Thyroid: No thyroid mass or thyromegaly.     Vascular: No carotid bruit.     Trachea: Trachea normal.  Cardiovascular:     Rate and Rhythm: Normal rate and regular rhythm.     Pulses: Normal pulses.     Heart sounds: Normal heart sounds, S1 normal and S2 normal. No murmur heard. No friction rub. No gallop.   Pulmonary:     Effort: Pulmonary effort is normal. No tachypnea or respiratory distress.     Breath sounds: Normal breath sounds. No decreased breath sounds, wheezing, rhonchi or rales.  Abdominal:     General: Bowel sounds are normal.     Palpations: Abdomen is soft.     Tenderness: There is no abdominal tenderness.  Musculoskeletal:     Cervical back: Normal range of motion and neck supple.  Skin:    General: Skin is warm and dry.     Findings: No rash.  Neurological:     Mental Status: She is alert.  Psychiatric:        Mood and Affect: Mood is not anxious or depressed.        Speech: Speech normal.        Behavior: Behavior normal. Behavior is cooperative.        Thought Content: Thought content normal.        Judgment: Judgment normal.        Results for orders placed or performed in visit on 04/10/20  HM MAMMOGRAPHY  Result Value Ref Range   HM Mammogram 0-4 Bi-Rad 0-4 Bi-Rad, Self Reported Normal    This visit occurred during the SARS-CoV-2 public health emergency.  Safety protocols were in place, including screening questions prior to the visit, additional usage of staff PPE, and extensive cleaning of  exam room while observing appropriate contact time as indicated for disinfecting solutions.   COVID 19 screen:  No recent travel or known exposure to COVID19 The patient denies respiratory symptoms of COVID 19 at this time. The importance of social distancing was discussed today.   Assessment and Plan  Given malignant HTN.. pt given 2 tabs of clonidine 0.1 mg.  After the clonidine she felt much better, less anxious.. BP recheck was at 180/90.    Problem List Items Addressed This Visit    DM type 2 with diabetic dyslipidemia ([place])    Due for 6 month re-eval.      Relevant Orders   Hemoglobin A1c   GAD (generalized anxiety disorder)    Poor control... increase cymbalta to 90 mg daily.      Relevant Medications   DULoxetine (CYMBALTA) 30 MG capsule   Malignant hypertension - Primary     Likely in part related to her poorly controlled anxiety.  given 0.2 mg oral clonidine in office.  She is asymmptomatic and cannot tell BP is high. Will have her continue max losartan and  Metoprolol, restart HCTZ but at 25 mg daily, treat anxiety. She will get BP cuff and call with measurements in 2 days.  MAy need additional med.   Consider EKG as well as labs at next OV in 2 weeks to eval for secondary cause, end organ damage and risk factor reduction.        Relevant Medications   hydrochlorothiazide (HYDRODIURIL) 25 MG tablet   cloNIDine (CATAPRES) tablet 0.2 mg (Start on 08/14/2020  1:00 PM)   Other Relevant Orders   Lipid panel   Comprehensive metabolic panel   TSH   CBC with Differential/Platelet   MDD  (major depressive disorder), recurrent episode, moderate (HCC)    Inadequate control.. increase cymbalta to 90 mg daily.  Encouraged restarting counseling.      Relevant Medications   DULoxetine (CYMBALTA) 30 MG capsule   Tobacco abuse    Quit smoking as it will increase BP!      Vitamin D deficiency    Due for re-eval.      Relevant Orders   VITAMIN D 25 Hydroxy (Vit-D Deficiency, Fractures)     Meds ordered this encounter  Medications  . hydrochlorothiazide (HYDRODIURIL) 25 MG tablet    Sig: Take 1 tablet (25 mg total) by mouth daily.    Dispense:  30 tablet    Refill:  3  . DULoxetine (CYMBALTA) 30 MG capsule    Sig: Take 3 capsules (90 mg total) by mouth daily.    Dispense:  90 capsule    Refill:  3  . cloNIDine (CATAPRES) tablet 0.2 mg     Virginia Lofts, MD

## 2020-08-14 NOTE — Assessment & Plan Note (Signed)
Poor control... increase cymbalta to 90 mg daily.

## 2020-08-14 NOTE — Assessment & Plan Note (Signed)
Likely in part related to her poorly controlled anxiety.  given 0.2 mg oral clonidine in office.  She is asymmptomatic and cannot tell BP is high. Will have her continue max losartan and  Metoprolol, restart HCTZ but at 25 mg daily, treat anxiety. She will get BP cuff and call with measurements in 2 days.  MAy need additional med.   Consider EKG as well as labs at next OV in 2 weeks to eval for secondary cause, end organ damage and risk factor reduction.

## 2020-08-14 NOTE — Assessment & Plan Note (Signed)
Due for re-eval. 

## 2020-08-14 NOTE — Assessment & Plan Note (Signed)
Due for 6 month re-eval.

## 2020-08-14 NOTE — Assessment & Plan Note (Signed)
Inadequate control.. increase cymbalta to 90 mg daily.  Encouraged restarting counseling.

## 2020-08-14 NOTE — Assessment & Plan Note (Signed)
Quit smoking as it will increase BP!

## 2020-08-14 NOTE — Patient Instructions (Addendum)
Get blood pressure cuff... check BP daily ... goal < 140/90. Continue losartan 100 mg daily, and metoprolol 100 mg twice daily.. Add HCTZ 25 mg daily.  Call with BP measurements in 2 days please... or send by [place]. Increase Cymbalta to 90 mg daily at bedtime (either 1 tab of 60 mg and 1 capsule of 30  OR 3 capsules of 30 mg ). Continue alprazolam at night for sleep. Quit smoking!! This will lower the blood pressure dramatically!

## 2020-08-16 ENCOUNTER — Telehealth: Payer: Self-pay | Admitting: Family Medicine

## 2020-08-16 NOTE — Telephone Encounter (Signed)
She stated that she has a blood pressure and she took it and it was 182/90, and she had a Cymbalta and they do not have any until tomorrow 3/17

## 2020-08-16 NOTE — Telephone Encounter (Signed)
I do not fully understand the message. Call please She has started the Cymbalta but has she also started the HCTZ during the day? In addition to her other meds? If so we will need to add and additional medicaiton

## 2020-08-17 NOTE — Telephone Encounter (Signed)
Spoke with Ms. Virginia Romero.  She got a blood pressure monitor and when she checked her BP it was 182/90.  She was able to start the HCTZ along with her other medications.  She has taken it yesterday and today.  She was not able to get her Cymbalta. They had to order it and should be in today.  Please advise.

## 2020-08-17 NOTE — Telephone Encounter (Signed)
Have her continue meds as planned and call next week with BP measurements.

## 2020-08-18 NOTE — Telephone Encounter (Signed)
Virginia Romero notified as instructed by telephone.  She states her BP first thing this morning was 220/110 but she will continue to follow and call us early next week with her readings.

## 2020-08-21 ENCOUNTER — Other Ambulatory Visit: Payer: Self-pay | Admitting: Family Medicine

## 2020-08-21 ENCOUNTER — Other Ambulatory Visit: Payer: Self-pay | Admitting: Internal Medicine

## 2020-08-21 DIAGNOSIS — E034 Atrophy of thyroid (acquired): Secondary | ICD-10-CM

## 2020-08-21 DIAGNOSIS — F419 Anxiety disorder, unspecified: Secondary | ICD-10-CM

## 2020-08-21 DIAGNOSIS — F32A Depression, unspecified: Secondary | ICD-10-CM

## 2020-08-21 DIAGNOSIS — F5104 Psychophysiologic insomnia: Secondary | ICD-10-CM

## 2020-08-21 NOTE — Telephone Encounter (Signed)
Refill request for Alprazolam 1 mg tablet  LOV - 08/14/20 Next OV - 09/01/20 Last refill - 07/26/20 #90/0

## 2020-08-22 ENCOUNTER — Encounter: Payer: Medicare Other | Admitting: Internal Medicine

## 2020-08-22 NOTE — Telephone Encounter (Signed)
Sent. Thanks.  Has f/u with Dr. Diona Browner pending, routed as Juluis Rainier.

## 2020-08-25 ENCOUNTER — Other Ambulatory Visit (INDEPENDENT_AMBULATORY_CARE_PROVIDER_SITE_OTHER): Payer: Medicare Other

## 2020-08-25 ENCOUNTER — Other Ambulatory Visit: Payer: Self-pay

## 2020-08-25 DIAGNOSIS — I10 Essential (primary) hypertension: Secondary | ICD-10-CM | POA: Diagnosis not present

## 2020-08-25 DIAGNOSIS — E1169 Type 2 diabetes mellitus with other specified complication: Secondary | ICD-10-CM

## 2020-08-25 DIAGNOSIS — E559 Vitamin D deficiency, unspecified: Secondary | ICD-10-CM

## 2020-08-25 DIAGNOSIS — E785 Hyperlipidemia, unspecified: Secondary | ICD-10-CM

## 2020-08-25 LAB — LIPID PANEL
Cholesterol: 137 mg/dL (ref 0–200)
HDL: 32.1 mg/dL — ABNORMAL LOW (ref >39.00)
LDL Cholesterol: 68 mg/dL (ref 0–99)
NonHDL: 104.74
Total CHOL/HDL Ratio: 4
Triglycerides: 182 mg/dL — ABNORMAL HIGH (ref 0.0–149.0)
VLDL: 36.4 mg/dL (ref 0.0–40.0)

## 2020-08-25 LAB — COMPREHENSIVE METABOLIC PANEL
ALT: 11 U/L (ref 0–35)
AST: 13 U/L (ref 0–37)
Albumin: 4.3 g/dL (ref 3.5–5.2)
Alkaline Phosphatase: 119 U/L — ABNORMAL HIGH (ref 39–117)
BUN: 17 mg/dL (ref 6–23)
CO2: 33 mEq/L — ABNORMAL HIGH (ref 19–32)
Calcium: 9.5 mg/dL (ref 8.4–10.5)
Chloride: 97 mEq/L (ref 96–112)
Creatinine, Ser: 1 mg/dL (ref 0.40–1.20)
GFR: 59.73 mL/min — ABNORMAL LOW (ref >60.00)
Glucose, Bld: 133 mg/dL — ABNORMAL HIGH (ref 70–99)
Potassium: 4.5 mEq/L (ref 3.5–5.1)
Sodium: 139 mEq/L (ref 135–145)
Total Bilirubin: 0.4 mg/dL (ref 0.2–1.2)
Total Protein: 6.9 g/dL (ref 6.0–8.3)

## 2020-08-25 LAB — CBC WITH DIFFERENTIAL/PLATELET
Basophils Absolute: 0.1 10*3/uL (ref 0.0–0.1)
Basophils Relative: 1.2 % (ref 0.0–3.0)
Eosinophils Absolute: 0.3 10*3/uL (ref 0.0–0.7)
Eosinophils Relative: 3.6 % (ref 0.0–5.0)
HCT: 38.4 % (ref 36.0–46.0)
Hemoglobin: 12.5 g/dL (ref 12.0–15.0)
Lymphocytes Relative: 23.8 % (ref 12.0–46.0)
Lymphs Abs: 1.7 10*3/uL (ref 0.7–4.0)
MCHC: 32.7 g/dL (ref 30.0–36.0)
MCV: 85.5 fl (ref 78.0–100.0)
Monocytes Absolute: 0.4 10*3/uL (ref 0.1–1.0)
Monocytes Relative: 5.5 % (ref 3.0–12.0)
Neutro Abs: 4.8 10*3/uL (ref 1.4–7.7)
Neutrophils Relative %: 65.9 % (ref 43.0–77.0)
Platelets: 309 10*3/uL (ref 150.0–400.0)
RBC: 4.49 Mil/uL (ref 3.87–5.11)
RDW: 15.6 % — ABNORMAL HIGH (ref 11.5–15.5)
WBC: 7.2 10*3/uL (ref 4.0–10.5)

## 2020-08-25 LAB — TSH: TSH: 7.67 u[IU]/mL — ABNORMAL HIGH (ref 0.35–4.50)

## 2020-08-25 LAB — HEMOGLOBIN A1C: Hgb A1c MFr Bld: 7.1 % — ABNORMAL HIGH (ref 4.6–6.5)

## 2020-08-25 LAB — VITAMIN D 25 HYDROXY (VIT D DEFICIENCY, FRACTURES): VITD: 25.32 ng/mL — ABNORMAL LOW (ref 30.00–100.00)

## 2020-08-25 NOTE — Progress Notes (Signed)
No critical labs need to be addressed urgently. We will discuss labs in detail at upcoming office visit.   

## 2020-08-30 ENCOUNTER — Encounter: Payer: Medicare Other | Admitting: Internal Medicine

## 2020-09-01 ENCOUNTER — Other Ambulatory Visit: Payer: Self-pay

## 2020-09-01 ENCOUNTER — Encounter: Payer: Self-pay | Admitting: Family Medicine

## 2020-09-01 ENCOUNTER — Ambulatory Visit (INDEPENDENT_AMBULATORY_CARE_PROVIDER_SITE_OTHER): Payer: Medicare Other | Admitting: Family Medicine

## 2020-09-01 VITALS — BP 150/74 | HR 66 | Temp 96.4°F | Ht 61.0 in | Wt 209.8 lb

## 2020-09-01 DIAGNOSIS — F5104 Psychophysiologic insomnia: Secondary | ICD-10-CM | POA: Diagnosis not present

## 2020-09-01 DIAGNOSIS — E785 Hyperlipidemia, unspecified: Secondary | ICD-10-CM | POA: Diagnosis not present

## 2020-09-01 DIAGNOSIS — M1710 Unilateral primary osteoarthritis, unspecified knee: Secondary | ICD-10-CM | POA: Diagnosis not present

## 2020-09-01 DIAGNOSIS — Z720 Tobacco use: Secondary | ICD-10-CM

## 2020-09-01 DIAGNOSIS — E1159 Type 2 diabetes mellitus with other circulatory complications: Secondary | ICD-10-CM | POA: Diagnosis not present

## 2020-09-01 DIAGNOSIS — I152 Hypertension secondary to endocrine disorders: Secondary | ICD-10-CM

## 2020-09-01 DIAGNOSIS — F331 Major depressive disorder, recurrent, moderate: Secondary | ICD-10-CM

## 2020-09-01 DIAGNOSIS — M179 Osteoarthritis of knee, unspecified: Secondary | ICD-10-CM

## 2020-09-01 DIAGNOSIS — F411 Generalized anxiety disorder: Secondary | ICD-10-CM

## 2020-09-01 DIAGNOSIS — E1169 Type 2 diabetes mellitus with other specified complication: Secondary | ICD-10-CM

## 2020-09-01 DIAGNOSIS — I10 Essential (primary) hypertension: Secondary | ICD-10-CM

## 2020-09-01 DIAGNOSIS — E559 Vitamin D deficiency, unspecified: Secondary | ICD-10-CM | POA: Diagnosis not present

## 2020-09-01 DIAGNOSIS — E034 Atrophy of thyroid (acquired): Secondary | ICD-10-CM | POA: Diagnosis not present

## 2020-09-01 LAB — T4, FREE: Free T4: 0.94 ng/dL (ref 0.60–1.60)

## 2020-09-01 LAB — T3, FREE: T3, Free: 2.8 pg/mL (ref 2.3–4.2)

## 2020-09-01 MED ORDER — VITAMIN D (ERGOCALCIFEROL) 1.25 MG (50000 UNIT) PO CAPS: 50000.0000 [IU] | ORAL_CAPSULE | ORAL | 0 refills | Status: DC

## 2020-09-01 NOTE — Assessment & Plan Note (Signed)
Using xanax for sleep.

## 2020-09-01 NOTE — Assessment & Plan Note (Signed)
She has started nicorette to quit smoking.

## 2020-09-01 NOTE — Assessment & Plan Note (Addendum)
Not on a supplement. Complete 12 week course of vit D prescription then follow with OTC vit D.

## 2020-09-01 NOTE — Assessment & Plan Note (Addendum)
Right knee.. not interested in surgery... pain improved now.  Using diclofenac cream as needed.

## 2020-09-01 NOTE — Assessment & Plan Note (Signed)
TSH was elevated.. need to determine if contributing to HTN.. check Free t3 and free t4 today.

## 2020-09-01 NOTE — Assessment & Plan Note (Signed)
Encouraged exercise, weight loss, healthy eating habits.  Follow up in 3 months.

## 2020-09-01 NOTE — Patient Instructions (Addendum)
Try to walk 3-5 days a week.  Work on low [employer] overall.  Complete 12 week course of vit D prescription then follow with OTC vit D.  Given Cymbalta more time to work on mood.

## 2020-09-01 NOTE — Progress Notes (Signed)
Patient ID: AUDRYANNA ZURITA, female    DOB: 1956-02-14, 65 y.o.   MRN: [id number]  This visit was conducted in person.  BP (!) 150/74   Pulse 66   Temp (!) 96.4 F (35.8 C) (Temporal)   Ht 5\' 1"  (1.549 m)   Wt 209 lb 12 oz (95.1 kg)   SpO2 96%   BMI 39.63 kg/m    CC:  Chief Complaint  Patient presents with  . Establish Care    TOC from D. Carlean Purl  . Follow-up    MDD/GAD & HTN    Subjective:   HPI: JANEENE SAND is a 65 y.o. female presenting on 09/01/2020 for Establish Care (TOC from D. Carlean Purl) and Follow-up (MDD/GAD & HTN)  Last OV with myself on 08/14/20 for malignant HTN and mood issues. Increased cymbalta to 90 mg daily.  Continue max losartan and  Metoprolol, restart HCTZ but at 25 mg daily, treat anxiety.  BP Readings from Last 3 Encounters:  09/01/20 (!) 150/74  08/14/20 (!) 180/90  04/18/20 140/90   Using alprazolam at night for sleep.  Today she reports she is calmer in general. NO SE to cymbalta at higher dose. PHq9: 11 GAD7:16   She has started nicorette for smoking cessation.  BP still elevated at home 164-200  Diabetes:   Not at goal.. despite metformin 500 mg BID Lab Results  Component Value Date   HGBA1C 7.1 (H) 08/25/2020  Using medications without difficulties: Hypoglycemic episodes: Hyperglycemic episodes: Feet problems: Blood Sugars averaging: eye exam within last year:  Elevated Cholesterol:  At goal on crestor 20 mg daily Lab Results  Component Value Date   CHOL 137 08/25/2020   HDL 32.10 (L) 08/25/2020   LDLCALC 68 08/25/2020   LDLDIRECT 143.0 07/28/2019   TRIG 182.0 (H) 08/25/2020   CHOLHDL 4 08/25/2020  Using medications without problems: Muscle aches:  Diet compliance: Exercise: Other complaints:    cbc normal, vit D was low at 25.  TSH was elevated showing possible inadequate control of hypothyroid.Marland Kitchen on levo 150 mcg daily   Relevant past medical, surgical, family and social history reviewed and updated as indicated.  Interim medical history since our last visit reviewed. Allergies and medications reviewed and updated. Outpatient Medications Prior to Visit  Medication Sig Dispense Refill  . ALPRAZolam (XANAX) 1 MG tablet TAKE 1 TABLET BY MOUTH THREE TIMES DAILY AS NEEDED FOR ANXIETY 90 tablet 0  . diclofenac Sodium (VOLTAREN) 1 % GEL Apply 4 g topically 4 (four) times daily as needed. 100 g 1  . DULoxetine (CYMBALTA) 30 MG capsule Take 3 capsules (90 mg total) by mouth daily. 90 capsule 3  . hydrochlorothiazide (HYDRODIURIL) 25 MG tablet Take 1 tablet (25 mg total) by mouth daily. 30 tablet 3  . levothyroxine (SYNTHROID) 150 MCG tablet Take 1 tablet (150 mcg total) by mouth daily before breakfast. 90 tablet 0  . losartan (COZAAR) 100 MG tablet Take 1 tablet by mouth once daily 90 tablet 1  . metFORMIN (GLUCOPHAGE) 500 MG tablet TAKE 1 TABLET BY MOUTH TWICE DAILY WITH A MEAL 180 tablet 1  . metoprolol tartrate (LOPRESSOR) 100 MG tablet Take 1 tablet (100 mg total) by mouth 2 (two) times daily. 180 tablet 0  . mupirocin ointment (BACTROBAN) 2 % Apply 1 application topically 2 (two) times daily. 30 g 0  . rosuvastatin (CRESTOR) 20 MG tablet Take 1 tablet (20 mg total) by mouth daily. 90 tablet 3   No facility-administered medications  prior to visit.     Per HPI unless specifically indicated in ROS section below Review of Systems  Constitutional: Negative for fatigue and fever.  HENT: Negative for congestion.   Eyes: Negative for pain.  Respiratory: Negative for cough and shortness of breath.   Cardiovascular: Negative for chest pain, palpitations and leg swelling.  Gastrointestinal: Negative for abdominal pain.  Genitourinary: Negative for dysuria and vaginal bleeding.  Musculoskeletal: Negative for back pain.  Neurological: Negative for syncope, light-headedness and headaches.  Psychiatric/Behavioral: Positive for dysphoric mood. The patient is nervous/anxious.    Objective:  BP (!) 150/74   Pulse  66   Temp (!) 96.4 F (35.8 C) (Temporal)   Ht 5\' 1"  (1.549 m)   Wt 209 lb 12 oz (95.1 kg)   SpO2 96%   BMI 39.63 kg/m   Wt Readings from Last 3 Encounters:  09/01/20 209 lb 12 oz (95.1 kg)  08/14/20 209 lb 4 oz (94.9 kg)  04/18/20 208 lb 8 oz (94.6 kg)      Physical Exam Constitutional:      General: She is not in acute distress.    Appearance: Normal appearance. She is well-developed. She is obese. She is not ill-appearing or toxic-appearing.  HENT:     Head: Normocephalic.     Right Ear: Hearing, tympanic membrane, ear canal and external ear normal. Tympanic membrane is not erythematous, retracted or bulging.     Left Ear: Hearing, tympanic membrane, ear canal and external ear normal. Tympanic membrane is not erythematous, retracted or bulging.     Nose: No mucosal edema or rhinorrhea.     Right Sinus: No maxillary sinus tenderness or frontal sinus tenderness.     Left Sinus: No maxillary sinus tenderness or frontal sinus tenderness.     Mouth/Throat:     Pharynx: Uvula midline.  Eyes:     General: Lids are normal. Lids are everted, no foreign bodies appreciated.     Conjunctiva/sclera: Conjunctivae normal.     Pupils: Pupils are equal, round, and reactive to light.  Neck:     Thyroid: No thyroid mass or thyromegaly.     Vascular: No carotid bruit.     Trachea: Trachea normal.  Cardiovascular:     Rate and Rhythm: Normal rate and regular rhythm.     Pulses: Normal pulses.     Heart sounds: Normal heart sounds, S1 normal and S2 normal. No murmur heard. No friction rub. No gallop.   Pulmonary:     Effort: Pulmonary effort is normal. No tachypnea or respiratory distress.     Breath sounds: Normal breath sounds. No decreased breath sounds, wheezing, rhonchi or rales.  Abdominal:     General: Bowel sounds are normal.     Palpations: Abdomen is soft.     Tenderness: There is no abdominal tenderness.  Musculoskeletal:     Cervical back: Normal range of motion and neck  supple.  Skin:    General: Skin is warm and dry.     Findings: No rash.  Neurological:     Mental Status: She is alert.  Psychiatric:        Mood and Affect: Mood is not anxious or depressed.        Speech: Speech normal.        Behavior: Behavior normal. Behavior is cooperative.        Thought Content: Thought content normal.        Judgment: Judgment normal.  Results for orders placed or performed in visit on 08/25/20  CBC with Differential/Platelet  Result Value Ref Range   WBC 7.2 4.0 - 10.5 K/uL   RBC 4.49 3.87 - 5.11 Mil/uL   Hemoglobin 12.5 12.0 - 15.0 g/dL   HCT 38.4 36.0 - 46.0 %   MCV 85.5 78.0 - 100.0 fl   MCHC 32.7 30.0 - 36.0 g/dL   RDW 15.6 (H) 11.5 - 15.5 %   Platelets 309.0 150.0 - 400.0 K/uL   Neutrophils Relative % 65.9 43.0 - 77.0 %   Lymphocytes Relative 23.8 12.0 - 46.0 %   Monocytes Relative 5.5 3.0 - 12.0 %   Eosinophils Relative 3.6 0.0 - 5.0 %   Basophils Relative 1.2 0.0 - 3.0 %   Neutro Abs 4.8 1.4 - 7.7 K/uL   Lymphs Abs 1.7 0.7 - 4.0 K/uL   Monocytes Absolute 0.4 0.1 - 1.0 K/uL   Eosinophils Absolute 0.3 0.0 - 0.7 K/uL   Basophils Absolute 0.1 0.0 - 0.1 K/uL  TSH  Result Value Ref Range   TSH 7.67 (H) 0.35 - 4.50 uIU/mL  VITAMIN D 25 Hydroxy (Vit-D Deficiency, Fractures)  Result Value Ref Range   VITD 25.32 (L) 30.00 - 100.00 ng/mL  Comprehensive metabolic panel  Result Value Ref Range   Sodium 139 135 - 145 mEq/L   Potassium 4.5 3.5 - 5.1 mEq/L   Chloride 97 96 - 112 mEq/L   CO2 33 (H) 19 - 32 mEq/L   Glucose, Bld 133 (H) 70 - 99 mg/dL   BUN 17 6 - 23 mg/dL   Creatinine, Ser 1.00 0.40 - 1.20 mg/dL   Total Bilirubin 0.4 0.2 - 1.2 mg/dL   Alkaline Phosphatase 119 (H) 39 - 117 U/L   AST 13 0 - 37 U/L   ALT 11 0 - 35 U/L   Total Protein 6.9 6.0 - 8.3 g/dL   Albumin 4.3 3.5 - 5.2 g/dL   GFR 59.73 (L) >60.00 mL/min   Calcium 9.5 8.4 - 10.5 mg/dL  Lipid panel  Result Value Ref Range   Cholesterol 137 0 - 200 mg/dL    Triglycerides 182.0 (H) 0.0 - 149.0 mg/dL   HDL 32.10 (L) >39.00 mg/dL   VLDL 36.4 0.0 - 40.0 mg/dL   LDL Cholesterol 68 0 - 99 mg/dL   Total CHOL/HDL Ratio 4    NonHDL 104.74   Hemoglobin A1c  Result Value Ref Range   Hgb A1c MFr Bld 7.1 (H) 4.6 - 6.5 %    This visit occurred during the SARS-CoV-2 public health emergency.  Safety protocols were in place, including screening questions prior to the visit, additional usage of staff PPE, and extensive cleaning of exam room while observing appropriate contact time as indicated for disinfecting solutions.   COVID 19 screen:  No recent travel or known exposure to COVID19 The patient denies respiratory symptoms of COVID 19 at this time. The importance of social distancing was discussed today.   Assessment and Plan    Problem List Items Addressed This Visit    Chronic insomnia    Using xanax for sleep.      Diabetes mellitus with circulatory complication (HCC)    Encouraged exercise, weight loss, healthy eating habits.  Follow up in 3 months.      DJD (degenerative joint disease) of knee    Right knee.. not interested in surgery... pain improved now.  Using diclofenac cream as needed.      GAD (generalized anxiety  disorder)   Hyperlipidemia associated with type 2 diabetes mellitus ([place])    At goal on crestor 20 mg daily       Hypertension associated with diabetes ([place])    Improving control with change in medications regimen and treatment of anxiety.  Encouraged exercise, weight loss, healthy eating habits.        Hypothyroid    TSH was elevated.. need to determine if contributing to HTN.. check Free t3 and free t4 today.      Relevant Orders   T3, free (Completed)   T4, free (Completed)   MDD (major depressive disorder), recurrent episode, moderate ([place]) - Primary    Improving control on cymbalta.      Tobacco abuse     She has started nicorette to quit smoking.      Vitamin D deficiency    Not on a  supplement. Complete 12 week course of vit D prescription then follow with OTC vit D.           Eliezer Lofts, MD

## 2020-09-11 ENCOUNTER — Other Ambulatory Visit: Payer: Self-pay | Admitting: Internal Medicine

## 2020-09-12 ENCOUNTER — Telehealth: Payer: Self-pay

## 2020-09-12 ENCOUNTER — Other Ambulatory Visit: Payer: Self-pay | Admitting: *Deleted

## 2020-09-12 DIAGNOSIS — E1169 Type 2 diabetes mellitus with other specified complication: Secondary | ICD-10-CM

## 2020-09-12 MED ORDER — METOPROLOL TARTRATE 100 MG PO TABS: 100.0000 mg | ORAL_TABLET | Freq: Two times a day (BID) | ORAL | 0 refills | Status: DC

## 2020-09-12 MED ORDER — ROSUVASTATIN CALCIUM 20 MG PO TABS: 20.0000 mg | ORAL_TABLET | Freq: Every day | ORAL | 1 refills | Status: DC

## 2020-09-12 NOTE — Telephone Encounter (Signed)
Pharmacy requests refill on: Rosuvastatin 20 mg   LAST REFILL: 08/02/2019 (Q-90, R-3) LAST OV: 09/01/2020 NEXT OV: 09/22/2020 PHARMACY: [id number] [place], [place]

## 2020-09-20 ENCOUNTER — Other Ambulatory Visit: Payer: Self-pay | Admitting: Family Medicine

## 2020-09-20 DIAGNOSIS — F5104 Psychophysiologic insomnia: Secondary | ICD-10-CM

## 2020-09-20 DIAGNOSIS — F32A Depression, unspecified: Secondary | ICD-10-CM

## 2020-09-20 DIAGNOSIS — F419 Anxiety disorder, unspecified: Secondary | ICD-10-CM

## 2020-09-20 NOTE — Telephone Encounter (Signed)
Last office visit 09/01/2020 for establish care.  Last refilled 08/22/2020 for #90 with no refills.  Next Appt: 09/22/2020 for HTN/Mood.

## 2020-09-22 ENCOUNTER — Other Ambulatory Visit: Payer: Self-pay

## 2020-09-22 ENCOUNTER — Encounter: Payer: Self-pay | Admitting: Family Medicine

## 2020-09-22 ENCOUNTER — Ambulatory Visit (INDEPENDENT_AMBULATORY_CARE_PROVIDER_SITE_OTHER): Payer: Medicare Other | Admitting: Family Medicine

## 2020-09-22 DIAGNOSIS — I10 Essential (primary) hypertension: Secondary | ICD-10-CM

## 2020-09-22 DIAGNOSIS — F411 Generalized anxiety disorder: Secondary | ICD-10-CM

## 2020-09-22 DIAGNOSIS — F331 Major depressive disorder, recurrent, moderate: Secondary | ICD-10-CM | POA: Diagnosis not present

## 2020-09-22 NOTE — Assessment & Plan Note (Signed)
She is not interested in change of cymbalta at this time.. currently at max.

## 2020-09-22 NOTE — Assessment & Plan Note (Addendum)
Minimal improvement.. but she is working on stress reduction and regular exercise. Using alprazolam  qHS and occ TID.

## 2020-09-22 NOTE — Progress Notes (Signed)
Patient ID: Virginia Romero, female    DOB: [birth year], 65 y.o.   MRN: [id number]  This visit was conducted in person.  BP 130/70   Pulse 70   Temp (!) 96.3 F (35.7 C) (Temporal)   Ht 5\' 1"  (1.549 m)   Wt 211 lb 8 oz (95.9 kg)   SpO2 97%   BMI 39.96 kg/m    CC:  Chief Complaint  Patient presents with  . Follow-up    HTN/Mood    Subjective:   HPI: Virginia Romero is a 65 y.o. female presenting on 09/22/2020 for Follow-up (HTN/Mood)  Hypertension:   Blood pressure now at goal on losartan 100 mg daily, metoprolol 100 mg BID and HCTZ 25 mg daily.  BP Readings from Last 3 Encounters:  09/22/20 130/70  09/01/20 (!) 150/74  08/14/20 (!) 180/90  Using medication without problems or lightheadedness:  none Chest pain with exertion:none Edema:none Short of breath:none Average home BPs: At home still seeing BP 160/80 Other issues: Wt Readings from Last 3 Encounters:  09/22/20 211 lb 8 oz (95.9 kg)  09/01/20 209 lb 12 oz (95.1 kg)  08/14/20 209 lb 4 oz (94.9 kg)   She has started walking.   MDD/GAD: Now on cymbalta 90 mg daily for 1 month. She has been under a lot of stress.  Using alprazolam 1 mg TID prn anxiety... usually         Relevant past medical, surgical, family and social history reviewed and updated as indicated. Interim medical history since our last visit reviewed. Allergies and medications reviewed and updated. Outpatient Medications Prior to Visit  Medication Sig Dispense Refill  . ALPRAZolam (XANAX) 1 MG tablet TAKE 1 TABLET BY MOUTH THREE TIMES DAILY AS NEEDED FOR ANXIETY 90 tablet 0  . diclofenac Sodium (VOLTAREN) 1 % GEL Apply 4 g topically 4 (four) times daily as needed. 100 g 1  . DULoxetine (CYMBALTA) 30 MG capsule Take 3 capsules (90 mg total) by mouth daily. 90 capsule 3  . hydrochlorothiazide (HYDRODIURIL) 25 MG tablet Take 1 tablet (25 mg total) by mouth daily. 30 tablet 3  . levothyroxine (SYNTHROID) 150 MCG tablet Take 1 tablet (150 mcg  total) by mouth daily before breakfast. 90 tablet 0  . losartan (COZAAR) 100 MG tablet Take 1 tablet by mouth once daily 90 tablet 1  . metFORMIN (GLUCOPHAGE) 500 MG tablet TAKE 1 TABLET BY MOUTH TWICE DAILY WITH A MEAL 180 tablet 1  . metoprolol tartrate (LOPRESSOR) 100 MG tablet Take 1 tablet (100 mg total) by mouth 2 (two) times daily. 180 tablet 0  . mupirocin ointment (BACTROBAN) 2 % Apply 1 application topically 2 (two) times daily. 30 g 0  . rosuvastatin (CRESTOR) 20 MG tablet Take 1 tablet (20 mg total) by mouth daily. 90 tablet 1  . Vitamin D, Ergocalciferol, (DRISDOL) 1.25 MG (50000 UNIT) CAPS capsule Take 1 capsule (50,000 Units total) by mouth every 7 (seven) days. 12 capsule 0   No facility-administered medications prior to visit.     Per HPI unless specifically indicated in ROS section below Review of Systems  Constitutional: Negative for fatigue and fever.  HENT: Negative for congestion.   Eyes: Negative for pain.  Respiratory: Negative for cough and shortness of breath.   Cardiovascular: Negative for chest pain, palpitations and leg swelling.  Gastrointestinal: Negative for abdominal pain.  Genitourinary: Negative for dysuria and vaginal bleeding.  Musculoskeletal: Negative for back pain.  Neurological: Negative for syncope, light-headedness  and headaches.  Psychiatric/Behavioral: Negative for dysphoric mood.   Objective:  BP 130/70   Pulse 70   Temp (!) 96.3 F (35.7 C) (Temporal)   Ht 5\' 1"  (1.549 m)   Wt 211 lb 8 oz (95.9 kg)   SpO2 97%   BMI 39.96 kg/m   Wt Readings from Last 3 Encounters:  09/22/20 211 lb 8 oz (95.9 kg)  09/01/20 209 lb 12 oz (95.1 kg)  08/14/20 209 lb 4 oz (94.9 kg)      Physical Exam Constitutional:      General: She is not in acute distress.    Appearance: Normal appearance. She is well-developed. She is obese. She is not ill-appearing or toxic-appearing.  HENT:     Head: Normocephalic.     Right Ear: Hearing, tympanic membrane,  ear canal and external ear normal. Tympanic membrane is not erythematous, retracted or bulging.     Left Ear: Hearing, tympanic membrane, ear canal and external ear normal. Tympanic membrane is not erythematous, retracted or bulging.     Nose: No mucosal edema or rhinorrhea.     Right Sinus: No maxillary sinus tenderness or frontal sinus tenderness.     Left Sinus: No maxillary sinus tenderness or frontal sinus tenderness.     Mouth/Throat:     Pharynx: Uvula midline.  Eyes:     General: Lids are normal. Lids are everted, no foreign bodies appreciated.     Conjunctiva/sclera: Conjunctivae normal.     Pupils: Pupils are equal, round, and reactive to light.  Neck:     Thyroid: No thyroid mass or thyromegaly.     Vascular: No carotid bruit.     Trachea: Trachea normal.  Cardiovascular:     Rate and Rhythm: Normal rate and regular rhythm.     Pulses: Normal pulses.     Heart sounds: Normal heart sounds, S1 normal and S2 normal. No murmur heard. No friction rub. No gallop.   Pulmonary:     Effort: Pulmonary effort is normal. No tachypnea or respiratory distress.     Breath sounds: Normal breath sounds. No decreased breath sounds, wheezing, rhonchi or rales.  Abdominal:     General: Bowel sounds are normal.     Palpations: Abdomen is soft.     Tenderness: There is no abdominal tenderness.  Musculoskeletal:     Cervical back: Normal range of motion and neck supple.  Skin:    General: Skin is warm and dry.     Findings: No rash.  Neurological:     Mental Status: She is alert.  Psychiatric:        Mood and Affect: Mood is not anxious or depressed.        Speech: Speech normal.        Behavior: Behavior normal. Behavior is cooperative.        Thought Content: Thought content normal.        Judgment: Judgment normal.       Results for orders placed or performed in visit on 09/01/20  T3, free  Result Value Ref Range   T3, Free 2.8 2.3 - 4.2 pg/mL  T4, free  Result Value Ref Range    Free T4 0.94 0.60 - 1.60 ng/dL    This visit occurred during the SARS-CoV-2 public health emergency.  Safety protocols were in place, including screening questions prior to the visit, additional usage of staff PPE, and extensive cleaning of exam room while observing appropriate contact time as indicated for disinfecting  solutions.   COVID 19 screen:  No recent travel or known exposure to COVID19 The patient denies respiratory symptoms of COVID 19 at this time. The importance of social distancing was discussed today.   Assessment and Plan    Problem List Items Addressed This Visit    GAD (generalized anxiety disorder)     Minimal improvement.. but she is working on stress reduction and regular exercise. Using alprazolam  qHS and occ TID.      Malignant hypertension    Improved control today in office on current regimen. May in part be better controlled than has been at home  given she treated anxiety earlier today with an alprazolam.. She will use Am alprazolam and follow Bps at home over next few weeks. Bring cuff to next OV.  losartan 100 mg daily, metoprolol 100 mg BID and HCTZ 25 mg daily.          MDD (major depressive disorder), recurrent episode, moderate ([place])    She is not interested in change of cymbalta at this time.. currently at max.          Eliezer Lofts, MD

## 2020-09-22 NOTE — Patient Instructions (Addendum)
Continue alprazolam at night, add 1/2-1 tabs in morning.. follow BP at home.  Continue medications  Keep up with walking.Marland Kitchen goal 3-5 days a week.. 30-50 min per time.  Keep follow up as scheduled.

## 2020-09-22 NOTE — Assessment & Plan Note (Signed)
Improved control today in office on current regimen. May in part be better controlled than has been at home  given she treated anxiety earlier today with an alprazolam.. She will use Am alprazolam and follow Bps at home over next few weeks. Bring cuff to next OV.  losartan 100 mg daily, metoprolol 100 mg BID and HCTZ 25 mg daily.

## 2020-10-09 ENCOUNTER — Telehealth: Payer: Self-pay | Admitting: Internal Medicine

## 2020-10-09 DIAGNOSIS — E034 Atrophy of thyroid (acquired): Secondary | ICD-10-CM

## 2020-10-11 DIAGNOSIS — E785 Hyperlipidemia, unspecified: Secondary | ICD-10-CM | POA: Insufficient documentation

## 2020-10-11 DIAGNOSIS — E1169 Type 2 diabetes mellitus with other specified complication: Secondary | ICD-10-CM | POA: Insufficient documentation

## 2020-10-11 NOTE — Assessment & Plan Note (Signed)
Improving control with change in medications regimen and treatment of anxiety.  Encouraged exercise, weight loss, healthy eating habits.

## 2020-10-11 NOTE — Assessment & Plan Note (Signed)
Improving control on cymbalta.

## 2020-10-11 NOTE — Assessment & Plan Note (Signed)
At goal on crestor 20 mg daily

## 2020-10-12 NOTE — Telephone Encounter (Addendum)
Refills sent to patient's pharmacy.  Patient notified of this via telephone.

## 2020-10-17 ENCOUNTER — Other Ambulatory Visit: Payer: Self-pay | Admitting: Family Medicine

## 2020-10-17 DIAGNOSIS — F5104 Psychophysiologic insomnia: Secondary | ICD-10-CM

## 2020-10-17 DIAGNOSIS — F32A Depression, unspecified: Secondary | ICD-10-CM

## 2020-10-17 DIAGNOSIS — F419 Anxiety disorder, unspecified: Secondary | ICD-10-CM

## 2020-10-18 NOTE — Telephone Encounter (Signed)
Pharmacy requests refill on: Alprazolam 1 mg   LAST REFILL: 09/21/2020 (Q-90, R-0) LAST OV: 09/22/2020 NEXT OV: 12/01/2020 PHARMACY: [id number] [place], [place]

## 2020-11-12 ENCOUNTER — Other Ambulatory Visit: Payer: Self-pay | Admitting: Family Medicine

## 2020-11-12 NOTE — Telephone Encounter (Signed)
Last office visit 09/22/20 for follow up HTN/Mood. Last refilled 09/01/20 for #12 with no refills.  Last Vit D level 08/25/20 low at 25.32 ng/ml.  Next Appt: 12/01/20 for DM.

## 2020-11-15 ENCOUNTER — Other Ambulatory Visit: Payer: Self-pay | Admitting: Family Medicine

## 2020-11-15 DIAGNOSIS — F5104 Psychophysiologic insomnia: Secondary | ICD-10-CM

## 2020-11-15 DIAGNOSIS — F32A Depression, unspecified: Secondary | ICD-10-CM

## 2020-11-15 DIAGNOSIS — F419 Anxiety disorder, unspecified: Secondary | ICD-10-CM

## 2020-11-15 NOTE — Telephone Encounter (Signed)
Last office visit 09/22/2020 for follow up Mood/HTN.  Last refilled 10/19/2020 for #90 with no refills.  Next Appt: 12/01/2020 for DM.

## 2020-11-21 ENCOUNTER — Other Ambulatory Visit: Payer: Self-pay | Admitting: *Deleted

## 2020-11-21 DIAGNOSIS — E785 Hyperlipidemia, unspecified: Secondary | ICD-10-CM

## 2020-11-21 DIAGNOSIS — E1169 Type 2 diabetes mellitus with other specified complication: Secondary | ICD-10-CM

## 2020-11-21 MED ORDER — METFORMIN HCL 500 MG PO TABS: 1.0000 | ORAL_TABLET | Freq: Two times a day (BID) | ORAL | 1 refills | Status: DC

## 2020-12-01 ENCOUNTER — Encounter: Payer: Self-pay | Admitting: Family Medicine

## 2020-12-01 ENCOUNTER — Other Ambulatory Visit: Payer: Self-pay

## 2020-12-01 ENCOUNTER — Ambulatory Visit (INDEPENDENT_AMBULATORY_CARE_PROVIDER_SITE_OTHER): Payer: Medicare Other | Admitting: Family Medicine

## 2020-12-01 VITALS — BP 118/64 | HR 62 | Temp 96.3°F | Ht 61.0 in | Wt 205.5 lb

## 2020-12-01 DIAGNOSIS — E1159 Type 2 diabetes mellitus with other circulatory complications: Secondary | ICD-10-CM

## 2020-12-01 DIAGNOSIS — F331 Major depressive disorder, recurrent, moderate: Secondary | ICD-10-CM

## 2020-12-01 DIAGNOSIS — E785 Hyperlipidemia, unspecified: Secondary | ICD-10-CM | POA: Diagnosis not present

## 2020-12-01 DIAGNOSIS — I152 Hypertension secondary to endocrine disorders: Secondary | ICD-10-CM | POA: Diagnosis not present

## 2020-12-01 DIAGNOSIS — E1169 Type 2 diabetes mellitus with other specified complication: Secondary | ICD-10-CM

## 2020-12-01 LAB — POCT GLYCOSYLATED HEMOGLOBIN (HGB A1C): Hemoglobin A1C: 7.4 % — AB (ref 4.0–5.6)

## 2020-12-01 MED ORDER — METFORMIN HCL 500 MG PO TABS: 1000.0000 mg | ORAL_TABLET | Freq: Two times a day (BID) | ORAL | 11 refills | Status: DC

## 2020-12-01 NOTE — Assessment & Plan Note (Signed)
Increase metfomrin to 1000 mg BID.Marland Kitchen lower sugar in diet.  Follow up in 3 months.

## 2020-12-01 NOTE — Assessment & Plan Note (Signed)
Not ideal control but better.. refuses counsleing, psychiatrist.  Consider depression adjunct if not continuing to improve at next OV.  INfo on safety reviewed.

## 2020-12-01 NOTE — Assessment & Plan Note (Signed)
Stable, chronic.  Continue current medication.   losartan 100 mg daily, metoprolol 100 mg BID and HCTZ 25 mg daily.

## 2020-12-01 NOTE — Patient Instructions (Addendum)
Stop sugar coffee... recommend no sweetener or Stevia.  Increase metformin to  2 tablets twice daily.  Keep up  healthy eating and regular exercise. Set up yearly eye exam for diabetes and have the opthalmologist send [place] a copy of the evaluation for the chart. Can use Kerasal on feet to soften calluses.

## 2020-12-01 NOTE — Progress Notes (Signed)
Patient ID: [name], female    DOB: 02-Oct-1955, 65 y.o.   MRN: [id number]  This visit was conducted in person.  BP 118/64   Pulse 62   Temp (!) 96.3 F (35.7 C) (Temporal)   Ht 5\' 1"  (1.549 m)   Wt 205 lb 8 oz (93.2 kg)   SpO2 98%   BMI 38.83 kg/m    CC: Chief Complaint  Patient presents with   Diabetes    Subjective:   HPI: Virginia Romero is a 65 y.o. female presenting on 12/01/2020 for Diabetes   Hypertension:   Excellent control : losartan 100 mg daily, metoprolol 100 mg BID and HCTZ 25 mg daily.  Using medication without problems or lightheadedness:  Chest pain with exertion: Edema: Short of breath: Average home BPs: Other issues:  Wt Readings from Last 3 Encounters:  12/01/20 205 lb 8 oz (93.2 kg)  09/22/20 211 lb 8 oz (95.9 kg)  09/01/20 209 lb 12 oz (95.1 kg)     Diabetes:    Almost at goal on metformin 500 mg BID. She has not been limiting carbd much. Lab Results  Component Value Date   HGBA1C 7.4 (A) 12/01/2020  Using medications without difficulties: Hypoglycemic episodes: Hyperglycemic episodes: Feet problems: none Blood Sugars averaging: eye exam within last year:   She is walking more, working out in yard.  MDD:  SI.. no plan. Does not think she would do it.  Improved control over.. but still thoughts about  On max cymbalta.Has seen psychitrist in past.. does not want to return.  Does not think counseling would be helpful.     Relevant past medical, surgical, family and social history reviewed and updated as indicated. Interim medical history since our last visit reviewed. Allergies and medications reviewed and updated. Outpatient Medications Prior to Visit  Medication Sig Dispense Refill   ALPRAZolam (XANAX) 1 MG tablet TAKE 1 TABLET BY MOUTH THREE TIMES DAILY AS NEEDED FOR ANXIETY 90 tablet 0   diclofenac Sodium (VOLTAREN) 1 % GEL Apply 4 g topically 4 (four) times daily as needed. 100 g 1   DULoxetine (CYMBALTA) 30 MG capsule  Take 3 capsules (90 mg total) by mouth daily. 90 capsule 3   EUTHYROX 150 MCG tablet TAKE 1 TABLET BY MOUTH ONCE DAILY BEFORE BREAKFAST 90 tablet 3   hydrochlorothiazide (HYDRODIURIL) 25 MG tablet Take 1 tablet (25 mg total) by mouth daily. 30 tablet 3   losartan (COZAAR) 100 MG tablet Take 1 tablet by mouth once daily 90 tablet 1   metFORMIN (GLUCOPHAGE) 500 MG tablet Take 1 tablet (500 mg total) by mouth 2 (two) times daily with a meal. 180 tablet 1   metoprolol tartrate (LOPRESSOR) 100 MG tablet Take 1 tablet (100 mg total) by mouth 2 (two) times daily. 180 tablet 0   rosuvastatin (CRESTOR) 20 MG tablet Take 1 tablet (20 mg total) by mouth daily. 90 tablet 1   Vitamin D, Ergocalciferol, (DRISDOL) 1.25 MG (50000 UNIT) CAPS capsule TAKE ONE CAPSULE BY MOUTH EVERY 7 DAYS 12 capsule 0   mupirocin ointment (BACTROBAN) 2 % Apply 1 application topically 2 (two) times daily. 30 g 0   No facility-administered medications prior to visit.     Per HPI unless specifically indicated in ROS section below Review of Systems  Constitutional:  Negative for fatigue and fever.  HENT:  Negative for ear pain.   Eyes:  Negative for pain.  Respiratory:  Negative for chest tightness and  shortness of breath.   Cardiovascular:  Negative for chest pain, palpitations and leg swelling.  Gastrointestinal:  Negative for abdominal pain.  Genitourinary:  Negative for dysuria.  Objective:  BP 118/64   Pulse 62   Temp (!) 96.3 F (35.7 C) (Temporal)   Ht 5\' 1"  (1.549 m)   Wt 205 lb 8 oz (93.2 kg)   SpO2 98%   BMI 38.83 kg/m   Wt Readings from Last 3 Encounters:  12/01/20 205 lb 8 oz (93.2 kg)  09/22/20 211 lb 8 oz (95.9 kg)  09/01/20 209 lb 12 oz (95.1 kg)      Physical Exam Constitutional:      General: She is not in acute distress.    Appearance: Normal appearance. She is well-developed. She is obese. She is not ill-appearing or toxic-appearing.  HENT:     Head: Normocephalic.     Right Ear: Hearing,  tympanic membrane, ear canal and external ear normal. Tympanic membrane is not erythematous, retracted or bulging.     Left Ear: Hearing, tympanic membrane, ear canal and external ear normal. Tympanic membrane is not erythematous, retracted or bulging.     Nose: No mucosal edema or rhinorrhea.     Right Sinus: No maxillary sinus tenderness or frontal sinus tenderness.     Left Sinus: No maxillary sinus tenderness or frontal sinus tenderness.     Mouth/Throat:     Pharynx: Uvula midline.  Eyes:     General: Lids are normal. Lids are everted, no foreign bodies appreciated.     Conjunctiva/sclera: Conjunctivae normal.     Pupils: Pupils are equal, round, and reactive to light.  Neck:     Thyroid: No thyroid mass or thyromegaly.     Vascular: No carotid bruit.     Trachea: Trachea normal.  Cardiovascular:     Rate and Rhythm: Normal rate and regular rhythm.     Pulses: Normal pulses.     Heart sounds: Normal heart sounds, S1 normal and S2 normal. No murmur heard.   No friction rub. No gallop.  Pulmonary:     Effort: Pulmonary effort is normal. No tachypnea or respiratory distress.     Breath sounds: Normal breath sounds. No decreased breath sounds, wheezing, rhonchi or rales.  Abdominal:     General: Bowel sounds are normal.     Palpations: Abdomen is soft.     Tenderness: There is no abdominal tenderness.  Musculoskeletal:     Cervical back: Normal range of motion and neck supple.  Skin:    General: Skin is warm and dry.     Findings: No rash.  Neurological:     Mental Status: She is alert.  Psychiatric:        Mood and Affect: Mood is not anxious or depressed.        Speech: Speech normal.        Behavior: Behavior normal. Behavior is cooperative.        Thought Content: Thought content normal.        Judgment: Judgment normal.      Results for orders placed or performed in visit on 12/01/20  POCT glycosylated hemoglobin (Hb A1C)  Result Value Ref Range   Hemoglobin A1C 7.4  (A) 4.0 - 5.6 %   HbA1c POC (<> result, manual entry)     HbA1c, POC (prediabetic range)     HbA1c, POC (controlled diabetic range)      This visit occurred during the SARS-CoV-2 public health emergency.  Safety protocols were in place, including screening questions prior to the visit, additional usage of staff PPE, and extensive cleaning of exam room while observing appropriate contact time as indicated for disinfecting solutions.   COVID 19 screen:  No recent travel or known exposure to COVID19 The patient denies respiratory symptoms of COVID 19 at this time. The importance of social distancing was discussed today.   Assessment and Plan    Problem List Items Addressed This Visit     Diabetes mellitus with circulatory complication ([place]) - Primary    Increase metfomrin to 1000 mg BID.Marland Kitchen lower sugar in diet.  Follow up in 3 months.       Relevant Medications   metFORMIN (GLUCOPHAGE) 500 MG tablet   Other Relevant Orders   POCT glycosylated hemoglobin (Hb A1C) (Completed)   Hypertension associated with diabetes (HCC)    Stable, chronic.  Continue current medication.   losartan 100 mg daily, metoprolol 100 mg BID and HCTZ 25 mg daily.           Relevant Medications   metFORMIN (GLUCOPHAGE) 500 MG tablet   MDD (major depressive disorder), recurrent episode, moderate ([place])    Not ideal control but better.. refuses counsleing, psychiatrist.  Consider depression adjunct if not continuing to improve at next OV.  INfo on safety reviewed.       Other Visit Diagnoses     DM type 2 with diabetic dyslipidemia ([place])       Relevant Medications   metFORMIN (GLUCOPHAGE) 500 MG tablet        Eliezer Lofts, MD

## 2020-12-01 NOTE — Progress Notes (Signed)
DL

## 2020-12-12 ENCOUNTER — Other Ambulatory Visit: Payer: Self-pay | Admitting: Family Medicine

## 2020-12-12 ENCOUNTER — Other Ambulatory Visit: Payer: Self-pay | Admitting: Internal Medicine

## 2020-12-12 DIAGNOSIS — F32A Depression, unspecified: Secondary | ICD-10-CM

## 2020-12-12 DIAGNOSIS — G8929 Other chronic pain: Secondary | ICD-10-CM

## 2020-12-12 DIAGNOSIS — F419 Anxiety disorder, unspecified: Secondary | ICD-10-CM

## 2020-12-12 DIAGNOSIS — M25561 Pain in right knee: Secondary | ICD-10-CM

## 2020-12-12 DIAGNOSIS — F5104 Psychophysiologic insomnia: Secondary | ICD-10-CM

## 2020-12-12 MED ORDER — DICLOFENAC SODIUM 1 % EX GEL: 4.0000 g | Freq: Four times a day (QID) | CUTANEOUS | 1 refills | Status: DC | PRN

## 2020-12-12 NOTE — Telephone Encounter (Signed)
Last office visit 12/01/2020 for DM.  Last refilled 01/03/2020 for 100 g with 1 refill by D. Carlean Purl.  CPE scheduled 03/06/2021.

## 2020-12-12 NOTE — Telephone Encounter (Signed)
Last office visit 12/01/2020 for DM.  Last refilled 11/16/2020 for #90 with no refills.  CPE scheduled 03/06/2021.

## 2020-12-19 ENCOUNTER — Other Ambulatory Visit: Payer: Self-pay | Admitting: Family Medicine

## 2020-12-19 DIAGNOSIS — I10 Essential (primary) hypertension: Secondary | ICD-10-CM

## 2021-01-01 ENCOUNTER — Other Ambulatory Visit: Payer: Self-pay | Admitting: Family Medicine

## 2021-01-10 ENCOUNTER — Other Ambulatory Visit: Payer: Self-pay | Admitting: Family Medicine

## 2021-01-10 DIAGNOSIS — F32A Depression, unspecified: Secondary | ICD-10-CM

## 2021-01-10 DIAGNOSIS — F5104 Psychophysiologic insomnia: Secondary | ICD-10-CM

## 2021-01-10 DIAGNOSIS — F419 Anxiety disorder, unspecified: Secondary | ICD-10-CM

## 2021-01-10 NOTE — Telephone Encounter (Signed)
Last office visit 12/01/2020 for DM.  Last refilled 12/12/2020 for #90 with no refills  CPE scheduled 03/06/2020.

## 2021-01-18 ENCOUNTER — Other Ambulatory Visit: Payer: Self-pay | Admitting: Family Medicine

## 2021-01-18 DIAGNOSIS — F411 Generalized anxiety disorder: Secondary | ICD-10-CM

## 2021-01-25 DIAGNOSIS — H2513 Age-related nuclear cataract, bilateral: Secondary | ICD-10-CM | POA: Diagnosis not present

## 2021-01-25 DIAGNOSIS — E119 Type 2 diabetes mellitus without complications: Secondary | ICD-10-CM | POA: Diagnosis not present

## 2021-01-25 DIAGNOSIS — H524 Presbyopia: Secondary | ICD-10-CM | POA: Diagnosis not present

## 2021-01-25 LAB — HM DIABETES EYE EXAM

## 2021-01-26 ENCOUNTER — Encounter: Payer: Self-pay | Admitting: Family Medicine

## 2021-02-10 ENCOUNTER — Other Ambulatory Visit: Payer: Self-pay | Admitting: Family Medicine

## 2021-02-10 DIAGNOSIS — F5104 Psychophysiologic insomnia: Secondary | ICD-10-CM

## 2021-02-10 DIAGNOSIS — F419 Anxiety disorder, unspecified: Secondary | ICD-10-CM

## 2021-02-10 DIAGNOSIS — F32A Depression, unspecified: Secondary | ICD-10-CM

## 2021-02-12 NOTE — Telephone Encounter (Signed)
Last office visit 12/01/2020 for DM.  Last refilled 12/12/2020 for #90 with no refills  CPE scheduled for 03/06/21.

## 2021-02-19 ENCOUNTER — Telehealth: Payer: Self-pay | Admitting: Family Medicine

## 2021-02-19 DIAGNOSIS — E1159 Type 2 diabetes mellitus with other circulatory complications: Secondary | ICD-10-CM

## 2021-02-19 DIAGNOSIS — E559 Vitamin D deficiency, unspecified: Secondary | ICD-10-CM

## 2021-02-19 DIAGNOSIS — E034 Atrophy of thyroid (acquired): Secondary | ICD-10-CM

## 2021-02-19 NOTE — Telephone Encounter (Signed)
-----   Message from Ellamae Sia sent at 02/14/2021 10:42 AM EDT ----- Regarding: Lab orders for Tuesday, 9.27.22 Patient is scheduled for CPX labs, please order future labs, Thanks , Karna Christmas

## 2021-02-23 ENCOUNTER — Other Ambulatory Visit: Payer: Self-pay | Admitting: Family Medicine

## 2021-02-23 DIAGNOSIS — Z23 Encounter for immunization: Secondary | ICD-10-CM | POA: Diagnosis not present

## 2021-02-25 NOTE — Telephone Encounter (Signed)
Last office visit 12/01/20 for DM.  Last refilled 11/13/20 for #12 with no refills.  Last Vit D level low at 25.32 ng/ml on 08/25/20.  CPE scheduled for 03/06/21.

## 2021-02-27 ENCOUNTER — Other Ambulatory Visit: Payer: Medicare Other

## 2021-03-06 ENCOUNTER — Ambulatory Visit (INDEPENDENT_AMBULATORY_CARE_PROVIDER_SITE_OTHER): Payer: Medicare Other | Admitting: Family Medicine

## 2021-03-06 ENCOUNTER — Ambulatory Visit: Payer: Medicare Other | Admitting: Family Medicine

## 2021-03-06 ENCOUNTER — Other Ambulatory Visit: Payer: Self-pay

## 2021-03-06 VITALS — BP 130/80 | HR 54 | Temp 97.5°F | Ht 61.75 in | Wt 201.8 lb

## 2021-03-06 DIAGNOSIS — I152 Hypertension secondary to endocrine disorders: Secondary | ICD-10-CM | POA: Diagnosis not present

## 2021-03-06 DIAGNOSIS — Z1211 Encounter for screening for malignant neoplasm of colon: Secondary | ICD-10-CM | POA: Diagnosis not present

## 2021-03-06 DIAGNOSIS — Z0000 Encounter for general adult medical examination without abnormal findings: Secondary | ICD-10-CM | POA: Diagnosis not present

## 2021-03-06 DIAGNOSIS — E1159 Type 2 diabetes mellitus with other circulatory complications: Secondary | ICD-10-CM | POA: Diagnosis not present

## 2021-03-06 DIAGNOSIS — F331 Major depressive disorder, recurrent, moderate: Secondary | ICD-10-CM | POA: Diagnosis not present

## 2021-03-06 DIAGNOSIS — E1169 Type 2 diabetes mellitus with other specified complication: Secondary | ICD-10-CM

## 2021-03-06 DIAGNOSIS — E034 Atrophy of thyroid (acquired): Secondary | ICD-10-CM

## 2021-03-06 DIAGNOSIS — E559 Vitamin D deficiency, unspecified: Secondary | ICD-10-CM | POA: Diagnosis not present

## 2021-03-06 DIAGNOSIS — E785 Hyperlipidemia, unspecified: Secondary | ICD-10-CM

## 2021-03-06 LAB — TSH: TSH: 8.05 u[IU]/mL — ABNORMAL HIGH (ref 0.35–5.50)

## 2021-03-06 LAB — COMPREHENSIVE METABOLIC PANEL
ALT: 8 U/L (ref 0–35)
AST: 11 U/L (ref 0–37)
Albumin: 4 g/dL (ref 3.5–5.2)
Alkaline Phosphatase: 85 U/L (ref 39–117)
BUN: 14 mg/dL (ref 6–23)
CO2: 29 mEq/L (ref 19–32)
Calcium: 8.6 mg/dL (ref 8.4–10.5)
Chloride: 103 mEq/L (ref 96–112)
Creatinine, Ser: 0.95 mg/dL (ref 0.40–1.20)
GFR: 63.29 mL/min (ref >60.00)
Glucose, Bld: 102 mg/dL — ABNORMAL HIGH (ref 70–99)
Potassium: 4 mEq/L (ref 3.5–5.1)
Sodium: 142 mEq/L (ref 135–145)
Total Bilirubin: 0.4 mg/dL (ref 0.2–1.2)
Total Protein: 6.5 g/dL (ref 6.0–8.3)

## 2021-03-06 LAB — LIPID PANEL
Cholesterol: 126 mg/dL (ref 0–200)
HDL: 31.9 mg/dL — ABNORMAL LOW (ref >39.00)
NonHDL: 94.3
Total CHOL/HDL Ratio: 4
Triglycerides: 206 mg/dL — ABNORMAL HIGH (ref 0.0–149.0)
VLDL: 41.2 mg/dL — ABNORMAL HIGH (ref 0.0–40.0)

## 2021-03-06 LAB — VITAMIN D 25 HYDROXY (VIT D DEFICIENCY, FRACTURES): VITD: 44.6 ng/mL (ref 30.00–100.00)

## 2021-03-06 LAB — LDL CHOLESTEROL, DIRECT: Direct LDL: 67 mg/dL

## 2021-03-06 LAB — HM DIABETES FOOT EXAM

## 2021-03-06 LAB — HEMOGLOBIN A1C: Hgb A1c MFr Bld: 6.9 % — ABNORMAL HIGH (ref 4.6–6.5)

## 2021-03-06 LAB — T3, FREE: T3, Free: 2.5 pg/mL (ref 2.3–4.2)

## 2021-03-06 LAB — T4, FREE: Free T4: 0.69 ng/dL (ref 0.60–1.60)

## 2021-03-06 NOTE — Patient Instructions (Addendum)
Please stop at the lab to have labs drawn.  Keep appt for mammogram.  Repeat cologuard for colon cancer screening. QUIT smoking!

## 2021-03-06 NOTE — Progress Notes (Signed)
Patient ID: Virginia Romero, female    DOB: 11/01/1955, 65 y.o.   MRN: [id number]  This visit was conducted in person.   CC: Chief Complaint  Patient presents with   Medicare Wellness Visit    Subjective:   HPI: Virginia Romero is a 65 y.o. female presenting on 03/06/2021 for Medicare Wellness Visit  The patient presents for annual medicare wellness, complete physical and review of chronic health problems. He/She also has the following acute concerns today:  I have personally reviewed the Medicare Annual Wellness questionnaire and have noted 1. The patient's medical and social history 2. Their use of alcohol, tobacco or illicit drugs 3. Their current medications and supplements 4. The patient's functional ability including ADL's, fall risks, home safety risks and hearing or visual             impairment. 5. Diet and physical activities 6. Evidence for depression or mood disorders 7.         Updated provider list Cognitive evaluation was performed and recorded on pt medicare questionnaire form. The patients weight, height, BMI and visual acuity have been recorded in the chart   I have made referrals, counseling and provided education to the patient based review of the above and I have provided the pt with a written personalized care plan for preventive services.   Documentation of this information was scanned into the electronic record under the media tab.   Advance directives and end of life planning reviewed in detail with patient and documented in EMR. Patient given handout on advance care directives if needed. HCPOA and living will updated if needed.  [place] Office Visit from 03/06/2021 in [place] [facility] [place]  PHQ-9 Total Score 10      Fall Risk  03/06/2021 07/15/2018 11/28/2015 07/26/2015 06/27/2015  Falls in the past year? 1 0 Yes Yes Yes  Number falls in past yr: 1 - 1 2 or more 1  Injury with Fall? 1 - - Yes Yes  Comment - - - - -  Risk for fall due  to : History of fall(s) - - - -  Follow up Falls evaluation completed - - - -   Hearing and vision screening performed.  Diabetes:  Using medications without difficulties: Hypoglycemic episodes: Hyperglycemic episodes: Feet problems: none Blood Sugars averaging: eye exam within last year:   Hypothyropid: due for re-eval.   Hypertension: At goal on HCTZ, losartan BP Readings from Last 3 Encounters:  03/06/21 130/80  12/01/20 118/64  09/22/20 130/70  Using medication without problems or lightheadedness:  none Chest pain with exertion: none Edema:none Short of breath:none Average home BPs: Other issues: Wt Readings from Last 3 Encounters:  03/06/21 201 lb 12.8 oz (91.5 kg)  12/01/20 205 lb 8 oz (93.2 kg)  09/22/20 211 lb 8 oz (95.9 kg)    Elevated Cholesterol: Due for re-eval. Using medications without problems: Muscle aches:  Diet compliance: improved Exercise: walking daily Other complaints:    Due for fasting labs today, fasting.   MDD, GAD: inadequate control  on Cymbalta. Granddaughter recently dx with  borderline personality disorder. Using  alprazolam BID.. has decreased down from TID.     Relevant past medical, surgical, family and social history reviewed and updated as indicated. Interim medical history since our last visit reviewed. Allergies and medications reviewed and updated. Outpatient Medications Prior to Visit  Medication Sig Dispense Refill   ALPRAZolam (XANAX) 1 MG tablet TAKE 1 TABLET BY MOUTH  THREE TIMES DAILY AS NEEDED FOR ANXIETY 90 tablet 0   diclofenac Sodium (VOLTAREN) 1 % GEL Apply 4 g topically 4 (four) times daily as needed. 100 g 1   DULoxetine (CYMBALTA) 30 MG capsule TAKE 3 CAPSULES BY MOUTH ONCE DAILY 90 capsule 5   EUTHYROX 150 MCG tablet TAKE 1 TABLET BY MOUTH ONCE DAILY BEFORE BREAKFAST 90 tablet 3   hydrochlorothiazide (HYDRODIURIL) 25 MG tablet Take 1 tablet by mouth once daily 90 tablet 1   losartan (COZAAR) 100 MG tablet Take  1 tablet by mouth once daily 90 tablet 1   metFORMIN (GLUCOPHAGE) 500 MG tablet Take 2 tablets (1,000 mg total) by mouth 2 (two) times daily with a meal. 180 tablet 11   metoprolol tartrate (LOPRESSOR) 100 MG tablet Take 1 tablet by mouth twice daily 180 tablet 0   rosuvastatin (CRESTOR) 20 MG tablet Take 1 tablet (20 mg total) by mouth daily. 90 tablet 1   Vitamin D, Ergocalciferol, (DRISDOL) 1.25 MG (50000 UNIT) CAPS capsule Take 1 capsule by mouth once a week 12 capsule 0   No facility-administered medications prior to visit.     Per HPI unless specifically indicated in ROS section below Review of Systems  Constitutional:  Negative for fatigue and fever.  HENT:  Negative for congestion and ear pain.   Eyes:  Negative for pain.  Respiratory:  Negative for cough, chest tightness and shortness of breath.   Cardiovascular:  Negative for chest pain, palpitations and leg swelling.  Gastrointestinal:  Negative for abdominal pain.  Genitourinary:  Negative for dysuria and vaginal bleeding.  Musculoskeletal:  Negative for back pain.  Neurological:  Negative for syncope, light-headedness and headaches.  Psychiatric/Behavioral:  Negative for dysphoric mood.   Objective:  There were no vitals taken for this visit.  Wt Readings from Last 3 Encounters:  12/01/20 205 lb 8 oz (93.2 kg)  09/22/20 211 lb 8 oz (95.9 kg)  09/01/20 209 lb 12 oz (95.1 kg)      Physical Exam Vitals and nursing note reviewed.  Constitutional:      General: She is not in acute distress.    Appearance: Normal appearance. She is well-developed. She is not ill-appearing or toxic-appearing.  HENT:     Head: Normocephalic.     Right Ear: Hearing, tympanic membrane, ear canal and external ear normal.     Left Ear: Hearing, tympanic membrane, ear canal and external ear normal.     Nose: Nose normal.  Eyes:     General: Lids are normal. Lids are everted, no foreign bodies appreciated.     Conjunctiva/sclera: Conjunctivae  normal.     Pupils: Pupils are equal, round, and reactive to light.  Neck:     Thyroid: No thyroid mass or thyromegaly.     Vascular: No carotid bruit.     Trachea: Trachea normal.  Cardiovascular:     Rate and Rhythm: Normal rate and regular rhythm.     Heart sounds: Normal heart sounds, S1 normal and S2 normal. No murmur heard.   No gallop.  Pulmonary:     Effort: Pulmonary effort is normal. No respiratory distress.     Breath sounds: Normal breath sounds. No wheezing, rhonchi or rales.  Abdominal:     General: Bowel sounds are normal. There is no distension or abdominal bruit.     Palpations: Abdomen is soft. There is no fluid wave or mass.     Tenderness: There is no abdominal tenderness. There is no  guarding or rebound.     Hernia: No hernia is present.  Musculoskeletal:     Cervical back: Normal range of motion and neck supple.  Lymphadenopathy:     Cervical: No cervical adenopathy.  Skin:    General: Skin is warm and dry.     Findings: No rash.  Neurological:     Mental Status: She is alert.     Cranial Nerves: No cranial nerve deficit.     Sensory: No sensory deficit.  Psychiatric:        Mood and Affect: Mood is not anxious or depressed.        Speech: Speech normal.        Behavior: Behavior normal. Behavior is cooperative.        Judgment: Judgment normal.     Diabetic foot exam: Normal inspection except dry skin. No skin breakdown No calluses  Normal DP pulses Normal sensation to light touch and monofilament Nails thickened  Results for orders placed or performed in visit on 01/26/21  HM DIABETES EYE EXAM  Result Value Ref Range   HM Diabetic Eye Exam No Retinopathy No Retinopathy    This visit occurred during the SARS-CoV-2 public health emergency.  Safety protocols were in place, including screening questions prior to the visit, additional usage of staff PPE, and extensive cleaning of exam room while observing appropriate contact time as indicated for  disinfecting solutions.   COVID 19 screen:  No recent travel or known exposure to COVID19 The patient denies respiratory symptoms of COVID 19 at this time. The importance of social distancing was discussed today.   Assessment and Plan The patient's preventative maintenance and recommended screening tests for an annual wellness exam were reviewed in full today. Brought up to date unless services declined.  Counselled on the importance of diet, exercise, and its role in overall health and mortality. The patient's FH and SH was reviewed, including their home life, tobacco status, and drug and alcohol status.   Vaccines: Had some done at pharmacy.. will check to determine what is due. Pap/DVE: 11/04/2018.. repeat one more time age 80. Mammo: 04/2020, schedule for 04/2021 Bone Density: 2018 osteopenia,repeat due in 2023 Colon:  Cologuard 02/07/2017 Smoking Status:smoker.. she reduced to 3 a day ETOH/ drug [id number]  Hep C: done    Problem List Items Addressed This Visit     Diabetes mellitus with circulatory complication ([place])   Hyperlipidemia associated with type 2 diabetes mellitus ([place])   Hypertension associated with diabetes ([place])   Hypothyroid   MDD (major depressive disorder), recurrent episode, moderate ([place])   Vitamin D deficiency   Other Visit Diagnoses     Medicare annual wellness visit, subsequent    -  Primary   Colon cancer screening       Relevant Orders   Cologuard        Eliezer Lofts, MD

## 2021-03-09 ENCOUNTER — Telehealth: Payer: Self-pay | Admitting: Family Medicine

## 2021-03-09 NOTE — Telephone Encounter (Signed)
Pt calling checking status of her lab results.

## 2021-03-12 ENCOUNTER — Other Ambulatory Visit: Payer: Self-pay | Admitting: Family Medicine

## 2021-03-12 DIAGNOSIS — F419 Anxiety disorder, unspecified: Secondary | ICD-10-CM

## 2021-03-12 DIAGNOSIS — F32A Depression, unspecified: Secondary | ICD-10-CM

## 2021-03-12 DIAGNOSIS — F5104 Psychophysiologic insomnia: Secondary | ICD-10-CM

## 2021-03-13 NOTE — Telephone Encounter (Signed)
Last OV- 03/06/2021 Next OV- N/A Last Filled - 02/12/2021

## 2021-03-19 NOTE — Telephone Encounter (Signed)
Spoke with Virginia Romero and advised her labs showed her diabetes control is improved! Her metabolic panel is in normal range. Electrolytes are in normal range. The LDL ( bad cholesterol) is great, but the HDL (good cholesterol) are too low and triglycerides are too high.  Her kidney function and  liver function are normal. Thyroid function  shows an abnormal TSH but the direct FT3 and  FT4 are normal. No changes are needed.   Vit D is in normal range as well. Advised to continue to work on healthy lifestyle.  Patient states understanding.

## 2021-03-19 NOTE — Telephone Encounter (Signed)
Pt called checking on the status of her lab results

## 2021-03-20 ENCOUNTER — Other Ambulatory Visit: Payer: Self-pay

## 2021-03-20 ENCOUNTER — Other Ambulatory Visit: Payer: Self-pay | Admitting: Family

## 2021-04-03 DIAGNOSIS — Z1211 Encounter for screening for malignant neoplasm of colon: Secondary | ICD-10-CM | POA: Diagnosis not present

## 2021-04-11 ENCOUNTER — Telehealth: Payer: Self-pay | Admitting: Family Medicine

## 2021-04-11 DIAGNOSIS — F32A Depression, unspecified: Secondary | ICD-10-CM

## 2021-04-11 DIAGNOSIS — F5104 Psychophysiologic insomnia: Secondary | ICD-10-CM

## 2021-04-11 DIAGNOSIS — F419 Anxiety disorder, unspecified: Secondary | ICD-10-CM

## 2021-04-11 DIAGNOSIS — E1169 Type 2 diabetes mellitus with other specified complication: Secondary | ICD-10-CM

## 2021-04-11 DIAGNOSIS — E785 Hyperlipidemia, unspecified: Secondary | ICD-10-CM

## 2021-04-11 NOTE — Telephone Encounter (Signed)
Last office visit 03/06/2021 for Virginia Romero.  Last refilled 03/13/2021 for #90 with no refills.  No future appointments.

## 2021-04-12 ENCOUNTER — Other Ambulatory Visit: Payer: Self-pay | Admitting: Family Medicine

## 2021-04-12 DIAGNOSIS — R195 Other fecal abnormalities: Secondary | ICD-10-CM

## 2021-04-12 LAB — COLOGUARD: COLOGUARD: POSITIVE — AB

## 2021-04-12 NOTE — Progress Notes (Signed)
Referral sent 

## 2021-04-17 ENCOUNTER — Other Ambulatory Visit: Payer: Self-pay | Admitting: Family Medicine

## 2021-04-17 MED ORDER — LEVOTHYROXINE SODIUM 150 MCG PO TABS: 150.0000 ug | ORAL_TABLET | Freq: Every day | ORAL | 3 refills | Status: DC

## 2021-04-17 NOTE — Telephone Encounter (Signed)
[facility] called in stated pt RX EUTHYROX 150 MCG tablet is discontinued need a new proscription #[id number]

## 2021-04-17 NOTE — Telephone Encounter (Signed)
New rx sent in to [facility].

## 2021-04-18 ENCOUNTER — Telehealth: Payer: Self-pay | Admitting: Family Medicine

## 2021-04-18 ENCOUNTER — Encounter: Payer: Self-pay | Admitting: Gastroenterology

## 2021-04-18 NOTE — Telephone Encounter (Signed)
I returned Virginia Romero's call.  She was inquiring about her colonoscopy referral.  I advised that Dr. Diona Browner has placed the referral and it has been sent to [facility]. I recommended that she call [facility] to try and get appointment scheduled since referral has been ordered.  Phone number given to patient and she will call them to schedule appointment.

## 2021-04-18 NOTE — Telephone Encounter (Signed)
Pt called and would like a call back about a bowel test that came back wrong.

## 2021-04-19 NOTE — Telephone Encounter (Signed)
Pt called in stated she call [facility] and they don't have an appointment until January . Wants to know if Dr Collene Mares in [place] may have a sooner appointment . Would like a call back to discuss [phone]

## 2021-05-01 NOTE — Telephone Encounter (Signed)
Virginia Romero notified as instructed by telephone.  Patient will plan on keeping appointments with [facility].

## 2021-05-01 NOTE — Telephone Encounter (Signed)
Pt is scheduled for Consult with [facility] on 06/01/21 and then Colonoscopy on 06/15/21  Pt is requesting sooner appt - unlikely but I will call Dr Lorie Apley office to see if able to do better than [facility].  Called and spoke with [facility] - Dr Lorie Apley office - spoke with Cornell Barman and she states that they are booking Consults at end of December and Colonoscopies into February.   Pt is better off keeping her appt with [facility] as scheduled as they have the sooner appts available.   ATC patient, no answer, no voicemail set up

## 2021-05-08 ENCOUNTER — Other Ambulatory Visit: Payer: Self-pay | Admitting: Family Medicine

## 2021-05-08 DIAGNOSIS — F32A Depression, unspecified: Secondary | ICD-10-CM

## 2021-05-08 DIAGNOSIS — F5104 Psychophysiologic insomnia: Secondary | ICD-10-CM

## 2021-05-08 NOTE — Telephone Encounter (Signed)
Last office visit 03/06/2021 for [place].  Last refilled 04/12/2021 for #90 with no refills.  No future appointments with PCP.

## 2021-05-22 DIAGNOSIS — Z1231 Encounter for screening mammogram for malignant neoplasm of breast: Secondary | ICD-10-CM | POA: Diagnosis not present

## 2021-05-22 LAB — HM MAMMOGRAPHY

## 2021-05-29 ENCOUNTER — Encounter: Payer: Self-pay | Admitting: Family Medicine

## 2021-06-01 ENCOUNTER — Ambulatory Visit (AMBULATORY_SURGERY_CENTER): Payer: Medicare Other | Admitting: *Deleted

## 2021-06-01 ENCOUNTER — Other Ambulatory Visit: Payer: Self-pay

## 2021-06-01 VITALS — Ht 61.0 in | Wt 193.0 lb

## 2021-06-01 DIAGNOSIS — Z1211 Encounter for screening for malignant neoplasm of colon: Secondary | ICD-10-CM

## 2021-06-01 MED ORDER — PEG 3350-KCL-NA BICARB-NACL 420 G PO SOLR: 4000.0000 mL | Freq: Once | ORAL | 0 refills | Status: AC

## 2021-06-01 NOTE — Progress Notes (Signed)

## 2021-06-07 ENCOUNTER — Other Ambulatory Visit: Payer: Self-pay | Admitting: Family Medicine

## 2021-06-07 DIAGNOSIS — F32A Depression, unspecified: Secondary | ICD-10-CM

## 2021-06-07 DIAGNOSIS — F5104 Psychophysiologic insomnia: Secondary | ICD-10-CM

## 2021-06-07 DIAGNOSIS — F419 Anxiety disorder, unspecified: Secondary | ICD-10-CM

## 2021-06-07 NOTE — Telephone Encounter (Signed)
Last office visit 03/06/2021 for [place].  Last refilled 05/08/2021 for #90 with no refills.  No future appointments with PCP.

## 2021-06-08 ENCOUNTER — Other Ambulatory Visit: Payer: Self-pay | Admitting: Family Medicine

## 2021-06-08 NOTE — Telephone Encounter (Signed)
Last office visit 03/06/2021 for [place].  Last refilled 02/26/2021 for #12 with no refills.  Last Vit D level 03/06/2021 which was normal at 44.60 ng/ml.  No future appointments with PCP.

## 2021-06-10 ENCOUNTER — Other Ambulatory Visit: Payer: Self-pay | Admitting: Family Medicine

## 2021-06-13 ENCOUNTER — Other Ambulatory Visit: Payer: Self-pay | Admitting: Family Medicine

## 2021-06-14 ENCOUNTER — Other Ambulatory Visit: Payer: Self-pay | Admitting: Family Medicine

## 2021-06-14 DIAGNOSIS — I10 Essential (primary) hypertension: Secondary | ICD-10-CM

## 2021-06-15 ENCOUNTER — Encounter: Payer: Self-pay | Admitting: Gastroenterology

## 2021-06-15 ENCOUNTER — Ambulatory Visit (AMBULATORY_SURGERY_CENTER): Payer: Medicare Other | Admitting: Gastroenterology

## 2021-06-15 ENCOUNTER — Other Ambulatory Visit: Payer: Self-pay

## 2021-06-15 VITALS — BP 121/59 | HR 53 | Temp 95.3°F | Resp 19 | Ht 61.75 in | Wt 193.0 lb

## 2021-06-15 DIAGNOSIS — D123 Benign neoplasm of transverse colon: Secondary | ICD-10-CM

## 2021-06-15 DIAGNOSIS — Z1211 Encounter for screening for malignant neoplasm of colon: Secondary | ICD-10-CM

## 2021-06-15 DIAGNOSIS — D120 Benign neoplasm of cecum: Secondary | ICD-10-CM | POA: Diagnosis not present

## 2021-06-15 DIAGNOSIS — D122 Benign neoplasm of ascending colon: Secondary | ICD-10-CM

## 2021-06-15 DIAGNOSIS — I10 Essential (primary) hypertension: Secondary | ICD-10-CM | POA: Diagnosis not present

## 2021-06-15 DIAGNOSIS — D125 Benign neoplasm of sigmoid colon: Secondary | ICD-10-CM

## 2021-06-15 DIAGNOSIS — R195 Other fecal abnormalities: Secondary | ICD-10-CM

## 2021-06-15 DIAGNOSIS — E119 Type 2 diabetes mellitus without complications: Secondary | ICD-10-CM | POA: Diagnosis not present

## 2021-06-15 MED ORDER — SODIUM CHLORIDE 0.9 % IV SOLN: 500.0000 mL | Freq: Once | INTRAVENOUS | Status: DC

## 2021-06-15 NOTE — Progress Notes (Signed)
VSS, transported to PACU °

## 2021-06-15 NOTE — Patient Instructions (Signed)
Resume previous diet and medications. Awaiting pathology results Repeat Colonoscopy in 1 year for surveillance. 2 day prep with Suprep or Plenvu and then Golytely.  YOU HAD AN ENDOSCOPIC PROCEDURE TODAY AT [facility]:   Refer to the procedure report that was given to you for any specific questions about what was found during the examination.  If the procedure report does not answer your questions, please call your gastroenterologist to clarify.  If you requested that your care partner not be given the details of your procedure findings, then the procedure report has been included in a sealed envelope for you to review at your convenience later.  YOU SHOULD EXPECT: Some feelings of bloating in the abdomen. Passage of more gas than usual.  Walking can help get rid of the air that was put into your GI tract during the procedure and reduce the bloating. If you had a lower endoscopy (such as a colonoscopy or flexible sigmoidoscopy) you may notice spotting of blood in your stool or on the toilet paper. If you underwent a bowel prep for your procedure, you may not have a normal bowel movement for a few days.  Please Note:  You might notice some irritation and congestion in your nose or some drainage.  This is from the oxygen used during your procedure.  There is no need for concern and it should clear up in a day or so.  SYMPTOMS TO REPORT IMMEDIATELY:  Following lower endoscopy (colonoscopy or flexible sigmoidoscopy):  Excessive amounts of blood in the stool  Significant tenderness or worsening of abdominal pains  Swelling of the abdomen that is new, acute  Fever of 100F or higher  For urgent or emergent issues, a gastroenterologist can be reached at any hour by calling [phone]. Do not use MyChart messaging for urgent concerns.    DIET:  We do recommend a small meal at first, but then you may proceed to your regular diet.  Drink plenty of fluids but you should avoid  alcoholic beverages for 24 hours.  ACTIVITY:  You should plan to take it easy for the rest of today and you should NOT DRIVE or use heavy machinery until tomorrow (because of the sedation medicines used during the test).    FOLLOW UP: Our staff will call the number listed on your records 48-72 hours following your procedure to check on you and address any questions or concerns that you may have regarding the information given to you following your procedure. If we do not reach you, we will leave a message.  We will attempt to reach you two times.  During this call, we will ask if you have developed any symptoms of COVID 19. If you develop any symptoms (ie: fever, flu-like symptoms, shortness of breath, cough etc.) before then, please call [phone].  If you test positive for Covid 19 in the 2 weeks post procedure, please call and report this information to [place].    If any biopsies were taken you will be contacted by phone or by letter within the next 1-3 weeks.  Please call us at [phone] if you have not heard about the biopsies in 3 weeks.    SIGNATURES/CONFIDENTIALITY: You and/or your care partner have signed paperwork which will be entered into your electronic medical record.  These signatures attest to the fact that that the information above on your After Visit Summary has been reviewed and is understood.  Full responsibility of the confidentiality of this discharge information  lies with you and/or your care-partner.

## 2021-06-15 NOTE — Progress Notes (Signed)
Called to room to assist during endoscopic procedure.  Patient ID and intended procedure confirmed with present staff. Received instructions for my participation in the procedure from the performing physician.  

## 2021-06-15 NOTE — Op Note (Signed)
[place] Patient Name: Virginia Romero Procedure Date: 06/15/2021 8:28 AM MRN: [id number] Endoscopist: [place]. Loletha Carrow , MD Age: 66 Referring MD:  Date of Birth: [birth year] Gender: Female Account #: [id number] Procedure:                Colonoscopy Indications:              Positive Cologuard test Medicines:                Monitored Anesthesia Care Procedure:                Pre-Anesthesia Assessment:                           - Prior to the procedure, a History and Physical                            was performed, and patient medications and                            allergies were reviewed. The patient's tolerance of                            previous anesthesia was also reviewed. The risks                            and benefits of the procedure and the sedation                            options and risks were discussed with the patient.                            All questions were answered, and informed consent                            was obtained. Prior Anticoagulants: The patient has                            taken no previous anticoagulant or antiplatelet                            agents. ASA Grade Assessment: III - A patient with                            severe systemic disease. After reviewing the risks                            and benefits, the patient was deemed in                            satisfactory condition to undergo the procedure.                           After obtaining informed consent, the colonoscope  was passed under direct vision. Throughout the                            procedure, the patient's blood pressure, pulse, and                            oxygen saturations were monitored continuously. The                            CF [id number] #[id number] was introduced through the anus                            and advanced to the the cecum, identified by                            appendiceal orifice and ileocecal  valve. The                            colonoscopy was performed with difficulty due to                            multiple diverticula in the colon, poor bowel prep,                            a redundant colon, significant looping and a                            tortuous colon. Successful completion of the                            procedure was aided by using manual pressure,                            straightening and shortening the scope to obtain                            bowel loop reduction and lavage. The patient                            tolerated the procedure well. The quality of the                            bowel preparation was fair. The ileocecal valve,                            appendiceal orifice, and rectum were photographed.                            The bowel preparation used was GoLYTELY. Scope In: 8:35:07 AM Scope Out: 9:08:04 AM Scope Withdrawal Time: 0 hours 26 minutes 34 seconds  Total Procedure Duration: 0 hours 32 minutes 57 seconds  Findings:                 The perianal and digital  rectal examinations were                            normal.                           A diminutive polyp was found in the cecum. The                            polyp was semi-sessile. The polyp was removed with                            a cold biopsy forceps. Resection and retrieval were                            complete. (Jar 1)                           A 12 mm polyp was found in the proximal ascending                            colon. The polyp was multi-lobulated and sessile.                            The polyp was removed en bloc with a hot snare.                            Resection was complete, then cold cut with snare                            and partially retrieved (due to prep). (Jar 1)                           A diminutive polyp was found in the ascending                            colon. The polyp was semi-sessile. The polyp was                             removed with a cold snare. Resection and retrieval                            were complete. (Jar 1)                           A 6 mm polyp was found in the transverse colon. The                            polyp was sessile. The polyp was removed with a                            cold snare. Resection and retrieval were complete.                            (  Jar 1)                           Multiple diverticula were found in the left colon                            and right colon.                           There were multiple lipomas in the proximal                            transverse colon and in the ascending colon.                           A 4 mm polyp was found in the transverse colon. The                            polyp was sessile. The polyp was removed with a                            cold snare. Resection and retrieval were complete.                           A 4 mm polyp was found in the recto-sigmoid colon.                            The polyp was pedunculated. The polyp was removed                            with a hot snare. Resection and retrieval were                            complete.                           Internal hemorrhoids were found.                           The exam was otherwise without abnormality on                            direct and retroflexion views. Complications:            No immediate complications. Estimated Blood Loss:     Estimated blood loss: none. Estimated blood loss                            was minimal. Impression:               - Preparation of the colon was fair.                           - One diminutive polyp in the cecum, removed with a  cold biopsy forceps. Resected and retrieved.                           - One 12 mm polyp in the proximal ascending colon,                            removed with a hot snare. Resected and partially                            retrieved.                           - One  diminutive polyp in the ascending colon,                            removed with a cold snare. Resected and retrieved.                           - One 6 mm polyp in the transverse colon, removed                            with a cold snare. Resected and retrieved.                           - Diverticulosis in the left colon and in the right                            colon.                           - Lipoma in the proximal transverse colon and in                            the ascending colon.                           - One 4 mm polyp in the transverse colon, removed                            with a cold snare. Resected and retrieved.                           - One 4 mm polyp at the recto-sigmoid colon,                            removed with a hot snare. Resected and retrieved.                           - Internal hemorrhoids.                           - The examination was otherwise normal on direct  and retroflexion views. Recommendation:           - Patient has a contact number available for                            emergencies. The signs and symptoms of potential                            delayed complications were discussed with the                            patient. Return to normal activities tomorrow.                            Written discharge instructions were provided to the                            patient.                           - Resume previous diet.                           - Continue present medications.                           - Await pathology results.                           - Repeat colonoscopy in 1 year for surveillance. 2                            day prep with Suprep or Plenvu and then golytely                            (fair prep likely due to diverticulosis, reundant                            and tortuous colon). Alias Villagran L. Loletha Carrow, MD 06/15/2021 9:20:50 AM This report has been signed electronically.

## 2021-06-15 NOTE — Progress Notes (Signed)
History and Physical:  This patient presents for endoscopic testing for: Encounter Diagnosis  Name Primary?   Special screening for malignant neoplasms, colon Yes    Positive cologuard Patient denies chronic abdominal pain, rectal bleeding, constipation or diarrhea.   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   Depression    Diabetes mellitus    Type II   Endometrial polyp    GERD (gastroesophageal reflux disease)    History of ovarian cyst    Hyperlipemia    Hypertension    Osteopenia 09/2016   T score -1.5   Positive colorectal cancer screening using Cologuard test 2022   PTSD (post-traumatic stress disorder)    Shortness of breath dyspnea    with anxiety   Thyroid disease      Past Surgical History: Past Surgical History:  Procedure Laterality Date   CESAREAN SECTION  '79 AND '86   X2   COLONOSCOPY     with polypectomy   DIAGNOSTIC LAPAROSCOPY  07/17/2006   WITH LYSIS OF PERITUBULAR AND PERIOVARIAN ADHESIONS, LYSIS OF OMENTAL ADHESIONS, LSO, RIGHT  OVARIAN CYSTECTOMY , HYSTEROSCOPY  WITH EXCISION OF ENDOMETRIAL POLYP...   ORIF HUMERUS FRACTURE Left 03/01/2015   Procedure: OPEN REDUCTION INTERNAL FIXATION (ORIF) PROXIMAL HUMERUS FRACTURE;  Surgeon: Netta Cedars, MD;  Location: [place];  Service: Orthopedics;  Laterality: Left;   THYROIDECTOMY  2000   TUBAL LIGATION      Allergies: No Known Allergies  Outpatient Meds: Current Outpatient Medications  Medication Sig Dispense Refill   ALPRAZolam (XANAX) 1 MG tablet TAKE 1 TABLET BY MOUTH THREE TIMES DAILY AS NEEDED FOR ANXIETY 90 tablet 0   DULoxetine (CYMBALTA) 30 MG capsule TAKE 3 CAPSULES BY MOUTH ONCE DAILY 90 capsule 5   hydrochlorothiazide (HYDRODIURIL) 25 MG tablet Take 1 tablet by mouth once daily  90 tablet 1   levothyroxine (SYNTHROID) 150 MCG tablet Take 1 tablet (150 mcg total) by mouth daily. 90 tablet 3   losartan (COZAAR) 100 MG tablet Take 1 tablet by mouth once daily 90 tablet 1   metFORMIN (GLUCOPHAGE) 500 MG tablet Take 2 tablets (1,000 mg total) by mouth 2 (two) times daily with a meal. 180 tablet 11   metoprolol tartrate (LOPRESSOR) 100 MG tablet Take 1 tablet (100 mg total) by mouth 2 (two) times daily. 180 tablet 1   rosuvastatin (CRESTOR) 20 MG tablet Take 1 tablet by mouth once daily 90 tablet 3   Vitamin D, Ergocalciferol, (DRISDOL) 1.25 MG (50000 UNIT) CAPS capsule Take 1 capsule by mouth once a week 12 capsule 0   diclofenac Sodium (VOLTAREN) 1 % GEL Apply 4 g topically 4 (four) times daily as needed. 100 g 1   Current Facility-Administered Medications  Medication Dose Route Frequency Provider Last Rate Last Admin   0.9 %  sodium chloride infusion  500 mL Intravenous Once Nelida Meuse III, MD          ___________________________________________________________________ Objective   Exam:  BP (!) 144/68    Pulse (!) 56    Temp (!) 95.3 F (35.2 C)    Ht 5' 1.75" (1.568 m)    Wt 193 lb (87.5 kg)    SpO2 96%  BMI 35.59 kg/m   CV: RRR without murmur, S1/S2 Resp: clear to auscultation bilaterally, normal RR and effort noted GI: soft, no tenderness, with active bowel sounds.   Assessment: Encounter Diagnosis  Name Primary?   Special screening for malignant neoplasms, colon Yes     Plan: Colonoscopy  The benefits and risks of the planned procedure were described in detail with the patient or (when appropriate) their health care proxy.  Risks were outlined as including, but not limited to, bleeding, infection, perforation, adverse medication reaction leading to cardiac or pulmonary decompensation, pancreatitis (if ERCP).  The limitation of incomplete mucosal visualization was also discussed.  No guarantees or warranties were given.    The patient is  appropriate for an endoscopic procedure in the ambulatory setting.   - Wilfrid Lund, MD

## 2021-06-18 ENCOUNTER — Telehealth: Payer: Self-pay | Admitting: Family Medicine

## 2021-06-18 NOTE — Telephone Encounter (Signed)
Mrs. Florance called in and stated that had a colonoscopy on Friday and the nurse told her that she had cellulitis and she is needing to get an antibiotic.

## 2021-06-18 NOTE — Telephone Encounter (Signed)
Please call patient.  She will need an appointment.

## 2021-06-18 NOTE — Telephone Encounter (Signed)
Spoke with Ms. Virginia Romero.  She states she had her colonoscopy of Friday 06/15/2021 and while she was there the nurse told her that she had cellulitis on her left leg and to call her doctor on Monday to get an antibiotic.  I advised patient that she will need to be seen prior to an antibiotic being prescribed.  She states she does not have transportation and does not have capability to do a virtual visit.  Patient states she was given cream for her leg a while back but it didn't help.  Looks like patient was seen by Dr. Einar Pheasant on 04/18/2020 and given mupirocin ointment.  Please advise.

## 2021-06-19 ENCOUNTER — Encounter: Payer: Self-pay | Admitting: Family Medicine

## 2021-06-19 ENCOUNTER — Ambulatory Visit (INDEPENDENT_AMBULATORY_CARE_PROVIDER_SITE_OTHER): Payer: Medicare Other | Admitting: Family Medicine

## 2021-06-19 ENCOUNTER — Encounter: Payer: Self-pay | Admitting: Gastroenterology

## 2021-06-19 ENCOUNTER — Telehealth: Payer: Self-pay

## 2021-06-19 VITALS — Ht 61.75 in

## 2021-06-19 DIAGNOSIS — L03116 Cellulitis of left lower limb: Secondary | ICD-10-CM | POA: Insufficient documentation

## 2021-06-19 MED ORDER — CEPHALEXIN 500 MG PO CAPS: 500.0000 mg | ORAL_CAPSULE | Freq: Three times a day (TID) | ORAL | 0 refills | Status: DC

## 2021-06-19 NOTE — Assessment & Plan Note (Signed)
Unable to see rash but GI RN felt likely cellulitis. No MD comment in notes.  Description sounds like chronic rash with possible superimposed infection.  will treat empirically with  Keflex TId x 7 days.Marland Kitchen if redness increases and spreads, fever or feeling bad... pt to call ASAP.  If not resolving.. pt to follow up for in person eval.

## 2021-06-19 NOTE — Progress Notes (Signed)
TELEPHONE VISIT  Due to national recommendations of social distancing due to [place] 19, Audio telehealth visit is felt to be most appropriate for this patient at this time.   I connected with Virginia Romero on 06/19/21 at  2:40 PM EST by telephone and verified that I am speaking with the correct person using two identifiers.   I discussed the limitations, risks, security and privacy concerns of performing an evaluation and management service by telephone and the availability of in person appointments. I also discussed with the patient that there may be a patient responsible charge related to this service. The patient expressed understanding and agreed to proceed.  Patient location: Home Provider Location:  [facility] Participants: Korde Jeppsen Diona Browner and MEARL HAREWOOD   History of Present Illness:   66 year old female patient  seen for colonoscopy at GI on 06/15/2021.  Nurse told her she had cellulitis on left leg.   She reports she noted  worsening of a chronic rash in last 2 years.  In last few days rash feels swollen. Rash extend from below knee cap to ankle. Area is pink, not red, not warm, not flaky.  When hits it it bleeds some.  No fever.  No flu like like symptoms.   No history of MRSA.   Had similar rash in past.. 04/2020.. treat mupirocin helped but did not resolve rash.  COVID 19 screen No recent travel or known exposure to COVID19 The patient denies respiratory symptoms of COVID 19 at this time.  The importance of social distancing was discussed today.   Review of Systems  Constitutional:  Negative for chills and fever.  HENT:  Negative for congestion and ear pain.   Eyes:  Negative for pain and redness.  Respiratory:  Negative for cough and shortness of breath.   Cardiovascular:  Negative for chest pain, palpitations and leg swelling.  Gastrointestinal:  Negative for abdominal pain, blood in stool, constipation, diarrhea, nausea and vomiting.  Genitourinary:   Negative for dysuria.  Musculoskeletal:  Negative for falls and myalgias.  Skin:  Positive for rash. Negative for itching.  Neurological:  Negative for dizziness.  Psychiatric/Behavioral:  Negative for depression. The patient is not nervous/anxious.      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 She has a 15.00 pack-year smoking history. She has never used smokeless tobacco. She reports that she does not drink alcohol and does not use drugs.   Current Outpatient Medications:    ALPRAZolam (XANAX) 1 MG tablet, TAKE 1 TABLET BY MOUTH THREE TIMES DAILY AS NEEDED FOR ANXIETY, Disp: 90 tablet, Rfl: 0   diclofenac Sodium (VOLTAREN) 1 % GEL, Apply 4 g topically 4 (four) times daily as needed., Disp: 100 g, Rfl: 1  DULoxetine (CYMBALTA) 30 MG capsule, TAKE 3 CAPSULES BY MOUTH ONCE DAILY, Disp: 90 capsule, Rfl: 5   hydrochlorothiazide (HYDRODIURIL) 25 MG tablet, Take 1 tablet by mouth once daily, Disp: 90 tablet, Rfl: 1   levothyroxine (SYNTHROID) 150 MCG tablet, Take 1 tablet (150 mcg total) by mouth daily., Disp: 90 tablet, Rfl: 3   losartan (COZAAR) 100 MG tablet, Take 1 tablet by mouth once daily, Disp: 90 tablet, Rfl: 1   metFORMIN (GLUCOPHAGE) 500 MG tablet, Take 2 tablets (1,000 mg total) by mouth 2 (two) times daily with a meal., Disp: 180 tablet, Rfl: 11   metoprolol tartrate (LOPRESSOR) 100 MG tablet, Take 1 tablet (100  mg total) by mouth 2 (two) times daily., Disp: 180 tablet, Rfl: 1   rosuvastatin (CRESTOR) 20 MG tablet, Take 1 tablet by mouth once daily, Disp: 90 tablet, Rfl: 3   Vitamin D, Ergocalciferol, (DRISDOL) 1.25 MG (50000 UNIT) CAPS capsule, Take 1 capsule by mouth once a week, Disp: 12 capsule, Rfl: 0   Observations/Objective: Height 5' 1.75" (1.568 m).  Physical Exam  Physical Exam Constitutional:      General: The patient is not in acute distress. Pulmonary:     Effort: Pulmonary effort is normal. No respiratory distress.  Neurological:     Mental Status: The patient is alert and oriented to person, place, and time.  Psychiatric:        Mood and Affect: Mood normal.        Behavior: Behavior normal.   Assessment Problem List Items Addressed This Visit     Left leg cellulitis - Primary    Unable to see rash but GI RN felt likely cellulitis. No MD comment in notes.  Description sounds like chronic rash with possible superimposed infection.  will treat empirically with  Keflex TId x 7 days.Marland Kitchen if redness increases and spreads, fever or feeling bad... pt to call ASAP.  If not resolving.. pt to follow up for in person eval.         I discussed the assessment and treatment plan with the patient. The patient was provided an opportunity to ask questions and all were answered. The patient agreed with the plan and demonstrated an understanding of the instructions.   The patient was advised to call back or seek an in-person evaluation if the symptoms worsen or if the condition fails to improve as anticipated.  I provided 11 minutes of non-face-to-face time during this encounter.   Eliezer Lofts, MD

## 2021-06-19 NOTE — Telephone Encounter (Signed)
Spoke with Ms. Virginia Romero.  She can only do a phone visit.  Not able to take picture and text it to Dr. Diona Browner.  Okay to do just phone visit per Dr. Diona Browner.  Appointment scheduled today at 2:40 pm

## 2021-06-19 NOTE — Telephone Encounter (Signed)
°  Follow up Call-  Call back number 06/15/2021  Post procedure Call Back phone  # [phone]  Permission to leave phone message No  Some recent data might be hidden     Patient questions:  Do you have a fever, pain , or abdominal swelling? No. Pain Score  0 *  Have you tolerated food without any problems? Yes.    Have you been able to return to your normal activities? Yes.    Do you have any questions about your discharge instructions: Diet   No. Medications  No. Follow up visit  No.  Do you have questions or concerns about your Care? No.  Actions: * If pain score is 4 or above: No action needed, pain <4.  Have you developed a fever since your procedure? no  2.   Have you had an respiratory symptoms (SOB or cough) since your procedure? no  3.   Have you tested positive for COVID 19 since your procedure no  4.   Have you had any family members/close contacts diagnosed with the COVID 19 since your procedure?  no   If yes to any of these questions please route to Joylene John, RN and Joella Prince, RN

## 2021-07-03 ENCOUNTER — Other Ambulatory Visit: Payer: Self-pay | Admitting: Family Medicine

## 2021-07-03 ENCOUNTER — Telehealth: Payer: Self-pay | Admitting: Gastroenterology

## 2021-07-03 DIAGNOSIS — F5104 Psychophysiologic insomnia: Secondary | ICD-10-CM

## 2021-07-03 DIAGNOSIS — F32A Depression, unspecified: Secondary | ICD-10-CM

## 2021-07-03 NOTE — Telephone Encounter (Signed)
Patient called requesting results from procedure done on 1/13. Please advise.

## 2021-07-03 NOTE — Telephone Encounter (Signed)
Returned call to patient. I told her a letter was mailed to her on 06/20/21 and it is possible that she has not received it in the mail yet. I read the pathology results letter. Pt is aware that I will place another copy in the mail today. Pt knows that she will need a repeat colonoscopy in 1 year. Pt verbalized understanding of all information and had no concerns at the end of the call.

## 2021-07-03 NOTE — Telephone Encounter (Signed)
Last office visit 06/19/2021 for left leg cellulitis.  Last refilled 06/07/2021 for #90 with no refills.  No future appointments.

## 2021-07-07 ENCOUNTER — Other Ambulatory Visit: Payer: Self-pay | Admitting: Family Medicine

## 2021-08-06 ENCOUNTER — Other Ambulatory Visit: Payer: Self-pay | Admitting: Family Medicine

## 2021-08-06 DIAGNOSIS — F5104 Psychophysiologic insomnia: Secondary | ICD-10-CM

## 2021-08-06 DIAGNOSIS — F32A Depression, unspecified: Secondary | ICD-10-CM

## 2021-08-06 DIAGNOSIS — F419 Anxiety disorder, unspecified: Secondary | ICD-10-CM

## 2021-08-07 NOTE — Telephone Encounter (Signed)
Last office visit 06/19/2021 for left leg cellulitis.  Last refilled 07/03/2021 for #90 with no refills.  No future appointments.  ?

## 2021-08-30 ENCOUNTER — Other Ambulatory Visit: Payer: Self-pay | Admitting: Family Medicine

## 2021-08-30 DIAGNOSIS — F411 Generalized anxiety disorder: Secondary | ICD-10-CM

## 2021-08-30 NOTE — Telephone Encounter (Signed)
Please call and schedule Diabetes follow up with fasting labs prior for Dr. Diona Browner.  She is suppose to follow up in April.  ? ? ?

## 2021-09-03 ENCOUNTER — Other Ambulatory Visit: Payer: Self-pay | Admitting: Family Medicine

## 2021-09-03 DIAGNOSIS — F5104 Psychophysiologic insomnia: Secondary | ICD-10-CM

## 2021-09-03 DIAGNOSIS — F419 Anxiety disorder, unspecified: Secondary | ICD-10-CM

## 2021-09-03 NOTE — Telephone Encounter (Signed)
Spoke with Ms. Virginia Romero and scheduled appointment on 09/27/21 at 9:20 am with Dr. Diona Browner to follow up mood.  ?

## 2021-09-03 NOTE — Telephone Encounter (Signed)
Have pt make a 6 month OV for mood follow up  Sometime in April. ?

## 2021-09-03 NOTE — Telephone Encounter (Signed)
Last office visit 06/19/2021 for left leg cellulitis.  Last refilled 08/07/2021 for #90 with no refills.  No future appointments.  ?

## 2021-09-21 ENCOUNTER — Ambulatory Visit (INDEPENDENT_AMBULATORY_CARE_PROVIDER_SITE_OTHER): Payer: Medicare Other | Admitting: Family Medicine

## 2021-09-21 ENCOUNTER — Encounter: Payer: Self-pay | Admitting: Family Medicine

## 2021-09-21 VITALS — BP 138/72 | HR 52 | Temp 98.0°F | Ht 64.0 in | Wt 200.3 lb

## 2021-09-21 DIAGNOSIS — I10 Essential (primary) hypertension: Secondary | ICD-10-CM

## 2021-09-21 DIAGNOSIS — R21 Rash and other nonspecific skin eruption: Secondary | ICD-10-CM | POA: Insufficient documentation

## 2021-09-21 DIAGNOSIS — F411 Generalized anxiety disorder: Secondary | ICD-10-CM

## 2021-09-21 DIAGNOSIS — E1169 Type 2 diabetes mellitus with other specified complication: Secondary | ICD-10-CM | POA: Diagnosis not present

## 2021-09-21 DIAGNOSIS — E1159 Type 2 diabetes mellitus with other circulatory complications: Secondary | ICD-10-CM

## 2021-09-21 DIAGNOSIS — F331 Major depressive disorder, recurrent, moderate: Secondary | ICD-10-CM

## 2021-09-21 DIAGNOSIS — E785 Hyperlipidemia, unspecified: Secondary | ICD-10-CM

## 2021-09-21 LAB — POCT GLYCOSYLATED HEMOGLOBIN (HGB A1C): Hemoglobin A1C: 6.2 % — AB (ref 4.0–5.6)

## 2021-09-21 MED ORDER — TRIAMCINOLONE ACETONIDE 0.5 % EX CREA: 1.0000 "application " | TOPICAL_CREAM | Freq: Two times a day (BID) | CUTANEOUS | 0 refills | Status: DC

## 2021-09-21 NOTE — Patient Instructions (Addendum)
Keep working on healthy low carb eating and regular exercise. ? Treat rash with topical steroid cream twice daily. ? As able, try to minimize the alprazolam. ?

## 2021-09-21 NOTE — Progress Notes (Signed)
Patient states that she is in good mood today. Has company coming in from [place]. Still  has rash on left leg!!!

## 2021-09-21 NOTE — Progress Notes (Signed)
Patient ID: Virginia Romero, female    DOB: 05-Jul-1955, 66 y.o.   MRN: [id number]  This visit was conducted in person.  BP 138/72 (BP Location: Left Arm, Patient Position: Sitting, Cuff Size: Normal)   Pulse (!) 52   Temp 98 F (36.7 C) (Oral)   Ht '5\' 4"'$  (1.626 m)   Wt 200 lb 4.8 oz (90.9 kg)   SpO2 98%   BMI 34.38 kg/m    CC:  Chief Complaint  Patient presents with   Diabetes    Subjective:   HPI: Virginia Romero is a 66 y.o. female presenting on 09/21/2021 for Diabetes  Diabetes: Well-controlled in office today with metformin 500 mg 2 tablets twice daily Lab Results  Component Value Date   HGBA1C 6.2 (A) 09/21/2021  Using medications without difficulties: none Hypoglycemic episodes: Hyperglycemic episodes: Feet problems: no ulcer Blood Sugars averaging: eye exam within last year: yes  Hypertension:   Blood pressure well controlled on losartan 100 mg daily, metoprolol tartrate 100 mg 1 tablet twice daily BP Readings from Last 3 Encounters:  09/21/21 138/72  06/15/21 (!) 121/59  03/06/21 130/80  Using medication without problems or lightheadedness:  none Chest pain with exertion: none Edema: none Short of breath: none Average home BPs: Other issues:   Reviewed office visit note from 06/19/2021.  Had noted at that point a rash from knee to ankle At that time I felt that it was most likely a chronic rash with possible superimposed infection.  She was treated empirically with Keflex 3 times daily x7 days.  She reports that her rash is  minimally better. Not itchy, not painful, no spreading.  She denies current mood issues.  She is doing well overall on Cymbalta 30 mg daily,  but had a low period during her birthday. Her dog died. She is using alprazolam 1 mg  twice daily.  She has been unable to wean off. [place] Office Visit from 09/21/2021 in [place] [facility] Total Score 0       Relevant past medical, surgical, family and  social history reviewed and updated as indicated. Interim medical history since our last visit reviewed. Allergies and medications reviewed and updated. Outpatient Medications Prior to Visit  Medication Sig Dispense Refill   ALPRAZolam (XANAX) 1 MG tablet TAKE 1 TABLET BY MOUTH THREE TIMES DAILY AS NEEDED FOR ANXIETY 90 tablet 0   cephALEXin (KEFLEX) 500 MG capsule Take 1 capsule (500 mg total) by mouth 3 (three) times daily. 21 capsule 0   diclofenac Sodium (VOLTAREN) 1 % GEL Apply 4 g topically 4 (four) times daily as needed. 100 g 1   DULoxetine (CYMBALTA) 30 MG capsule TAKE 3 CAPSULES BY MOUTH ONCE DAILY 90 capsule 0   hydrochlorothiazide (HYDRODIURIL) 25 MG tablet Take 1 tablet by mouth once daily 90 tablet 1   levothyroxine (SYNTHROID) 150 MCG tablet Take 1 tablet (150 mcg total) by mouth daily. 90 tablet 3   losartan (COZAAR) 100 MG tablet Take 1 tablet by mouth once daily 90 tablet 1   metFORMIN (GLUCOPHAGE) 500 MG tablet Take 2 tablets (1,000 mg total) by mouth 2 (two) times daily with a meal. 180 tablet 11   metoprolol tartrate (LOPRESSOR) 100 MG tablet Take 1 tablet (100 mg total) by mouth 2 (two) times daily. 180 tablet 1   rosuvastatin (CRESTOR) 20 MG tablet Take 1 tablet by mouth once daily 90 tablet 3   Vitamin D, Ergocalciferol, (DRISDOL)  1.25 MG (50000 UNIT) CAPS capsule Take 1 capsule by mouth once a week 12 capsule 0   No facility-administered medications prior to visit.     Per HPI unless specifically indicated in ROS section below Review of Systems Objective:  BP 138/72 (BP Location: Left Arm, Patient Position: Sitting, Cuff Size: Normal)   Pulse (!) 52   Temp 98 F (36.7 C) (Oral)   Ht '5\' 4"'$  (1.626 m)   Wt 200 lb 4.8 oz (90.9 kg)   SpO2 98%   BMI 34.38 kg/m   Wt Readings from Last 3 Encounters:  09/21/21 200 lb 4.8 oz (90.9 kg)  06/15/21 193 lb (87.5 kg)  06/01/21 193 lb (87.5 kg)      Physical Exam    Results for orders placed or performed in visit on  09/21/21  POCT HgB A1C  Result Value Ref Range   Hemoglobin A1C 6.2 (A) 4.0 - 5.6 %   HbA1c POC (<> result, manual entry)     HbA1c, POC (prediabetic range)     HbA1c, POC (controlled diabetic range)      This visit occurred during the SARS-CoV-2 public health emergency.  Safety protocols were in place, including screening questions prior to the visit, additional usage of staff PPE, and extensive cleaning of exam room while observing appropriate contact time as indicated for disinfecting solutions.   COVID 19 screen:  No recent travel or known exposure to COVID19 The patient denies respiratory symptoms of COVID 19 at this time. The importance of social distancing was discussed today.   Assessment and Plan    Problem List Items Addressed This Visit     DM type 2 with diabetic dyslipidemia ([place]) - Primary    Chronic, well controlled on current regimen, continue  Metformin 500 mg 2 tablets twice daily.       Relevant Orders   POCT HgB A1C (Completed)   GAD (generalized anxiety disorder)    Chronic, moderate control. She continues to require alprazolam 1 mg twice daily.  She has tried to wean off but has been unable to without a recurrence of her anxiety.  We will continue this medication at this time.  No red flags for medication misuse.       Hypertension associated with diabetes (HCC)    Stable, chronic.  Continue current medication.   Losartan 100 mg daily Metoprolol tartrate 100 mg 1 tablet twice daily      MDD (major depressive disorder), recurrent episode, moderate (HCC)    Chronic, improved control Cymbalta 30 mg daily        Rash    Acute, minimal improvement following a course of Keflex.  At this point the rash looks more inflammatory so I will treat with a topical steroid course. If not improving I will refer her to dermatology       Meds ordered this encounter  Medications   triamcinolone cream (KENALOG) 0.5 %    Sig: Apply 1 application. topically 2  (two) times daily.    Dispense:  30 g    Refill:  0   Orders Placed This Encounter  Procedures   POCT HgB A1C     Eliezer Lofts, MD

## 2021-09-23 ENCOUNTER — Other Ambulatory Visit: Payer: Self-pay | Admitting: Family Medicine

## 2021-09-23 NOTE — Telephone Encounter (Signed)
Last office visit 09/21/21 for DM.  Last refilled 06/08/21 for #12 with no refills.  Last Vit D level 03/26/21 which was normal at 44.60 ng/ml.  CPE scheduled for 03/26/22.  ?

## 2021-09-27 ENCOUNTER — Ambulatory Visit: Payer: Medicare Other | Admitting: Family Medicine

## 2021-10-02 ENCOUNTER — Other Ambulatory Visit: Payer: Self-pay | Admitting: Family Medicine

## 2021-10-02 DIAGNOSIS — F5104 Psychophysiologic insomnia: Secondary | ICD-10-CM

## 2021-10-02 DIAGNOSIS — F419 Anxiety disorder, unspecified: Secondary | ICD-10-CM

## 2021-10-02 NOTE — Telephone Encounter (Signed)
Last office visit 09/21/2021 for DM.  Last refilled 09/03/2021 for #90 with no refills.  CPE scheduled 03/26/2022. ?

## 2021-10-14 ENCOUNTER — Other Ambulatory Visit: Payer: Self-pay | Admitting: Family Medicine

## 2021-10-14 DIAGNOSIS — F411 Generalized anxiety disorder: Secondary | ICD-10-CM

## 2021-10-27 NOTE — Assessment & Plan Note (Signed)
Chronic, well controlled on current regimen, continue  Metformin 500 mg 2 tablets twice daily.

## 2021-10-27 NOTE — Assessment & Plan Note (Addendum)
Acute, minimal improvement following a course of Keflex.  At this point the rash looks more inflammatory so I will treat with a topical steroid course. If not improving I will refer her to dermatology

## 2021-10-27 NOTE — Assessment & Plan Note (Signed)
Stable, chronic.  Continue current medication.   Losartan 100 mg daily Metoprolol tartrate 100 mg 1 tablet twice daily

## 2021-10-27 NOTE — Assessment & Plan Note (Signed)
Chronic, moderate control. She continues to require alprazolam 1 mg twice daily.  She has tried to wean off but has been unable to without a recurrence of her anxiety.  We will continue this medication at this time.  No red flags for medication misuse.

## 2021-10-27 NOTE — Assessment & Plan Note (Signed)
Chronic, improved control Cymbalta 30 mg daily

## 2021-10-30 ENCOUNTER — Other Ambulatory Visit: Payer: Self-pay | Admitting: Family Medicine

## 2021-10-30 DIAGNOSIS — F32A Depression, unspecified: Secondary | ICD-10-CM

## 2021-10-30 DIAGNOSIS — F5104 Psychophysiologic insomnia: Secondary | ICD-10-CM

## 2021-10-30 NOTE — Telephone Encounter (Signed)
Last office visit 09/21/2021 for DM.  Last refilled 10/02/2021 for #90 with no refills.  CPE scheduled 03/26/2022.

## 2021-12-07 ENCOUNTER — Other Ambulatory Visit: Payer: Self-pay | Admitting: Family Medicine

## 2021-12-09 ENCOUNTER — Other Ambulatory Visit: Payer: Self-pay | Admitting: Family Medicine

## 2021-12-09 DIAGNOSIS — F32A Depression, unspecified: Secondary | ICD-10-CM

## 2021-12-09 DIAGNOSIS — F5104 Psychophysiologic insomnia: Secondary | ICD-10-CM

## 2021-12-09 NOTE — Telephone Encounter (Signed)
Last office visit 09/21/21 for DM.  Last refilled alprazolam 10/30/21 for #90 with no refills.  Vit 09/24/21 for #12 with  no refills.  Last Vit D level 03/06/21 with was normal at 44.60 ng/mL.  CPE scheduled 03/26/22.

## 2021-12-13 ENCOUNTER — Other Ambulatory Visit: Payer: Self-pay | Admitting: Family Medicine

## 2021-12-13 DIAGNOSIS — E1169 Type 2 diabetes mellitus with other specified complication: Secondary | ICD-10-CM

## 2022-01-07 ENCOUNTER — Other Ambulatory Visit: Payer: Self-pay | Admitting: Family Medicine

## 2022-01-07 DIAGNOSIS — I10 Essential (primary) hypertension: Secondary | ICD-10-CM

## 2022-01-14 ENCOUNTER — Other Ambulatory Visit: Payer: Self-pay | Admitting: Family Medicine

## 2022-01-14 NOTE — Telephone Encounter (Signed)
Refill request Triamcinolone cream Last refill 09/21/21   30 G Last office visit 09/21/21

## 2022-01-21 ENCOUNTER — Telehealth: Payer: Self-pay

## 2022-01-21 NOTE — Telephone Encounter (Signed)
Patient states she has been summoned to do [employer], and wants to know if she can get a letter to excuse her as she states she isnt well enough to do it with depression, high bp and diabetes. [name] also doesn't have a vehicle or the money to take a taxi to and from town to attend this.

## 2022-01-22 NOTE — Telephone Encounter (Signed)
Please obtain the general number and date of jury duty and I will complete a temporary jury duty excuse letter.

## 2022-01-22 NOTE — Telephone Encounter (Signed)
Spoke to patient by telephone and was advised that the number is  #[phone] and date is 03/28/22. Patient requested that the letter be mailed to her home address because she does not have transportation to pick it up.

## 2022-01-27 ENCOUNTER — Other Ambulatory Visit: Payer: Self-pay | Admitting: Family Medicine

## 2022-01-27 DIAGNOSIS — F419 Anxiety disorder, unspecified: Secondary | ICD-10-CM

## 2022-01-27 DIAGNOSIS — F5104 Psychophysiologic insomnia: Secondary | ICD-10-CM

## 2022-01-27 NOTE — Telephone Encounter (Signed)
Last office visit 09/21/21 for DM.  Last refilled 12/11/21 for #90 with no refills.  C)E scheduled for 03/26/22.

## 2022-01-29 ENCOUNTER — Encounter: Payer: Self-pay | Admitting: Family Medicine

## 2022-01-29 NOTE — Telephone Encounter (Signed)
Letter signed and in outbox.

## 2022-01-30 NOTE — Telephone Encounter (Signed)
[place] letter mailed to patient as requested.

## 2022-02-11 ENCOUNTER — Telehealth: Payer: Self-pay | Admitting: Family Medicine

## 2022-02-11 MED ORDER — CEPHALEXIN 500 MG PO CAPS: 500.0000 mg | ORAL_CAPSULE | Freq: Three times a day (TID) | ORAL | 0 refills | Status: DC

## 2022-02-11 NOTE — Telephone Encounter (Signed)
Refilled antibiotics x 1.

## 2022-02-11 NOTE — Telephone Encounter (Signed)
Patient still has rash, triamcinolone cream (KENALOG) 0.5 % cream worked some, but does not go away  Patient would like to try an antibiotic before she needs to go to a dermatology  Patient vehicle is broke down, $90 cab ride to come in,  Possibly could do a phone visit, no access to video, but states Dr. Diona Browner has seen the rash twice  Please call the patient at [phone]

## 2022-02-11 NOTE — Telephone Encounter (Signed)
Returned Ms. Jungwirth's call to let her know that Dr. Diona Browner sent in an antibiotic for her but her voicemail is not set up so I was unable to leave a message.

## 2022-02-11 NOTE — Telephone Encounter (Signed)
Ms. Macnair notified by telephone that Dr. Diona Browner sent in [place] to her pharmacy.

## 2022-02-22 ENCOUNTER — Other Ambulatory Visit: Payer: Self-pay | Admitting: Family Medicine

## 2022-02-22 ENCOUNTER — Telehealth: Payer: Self-pay | Admitting: Family Medicine

## 2022-02-22 MED ORDER — TRIAMCINOLONE ACETONIDE 0.5 % EX CREA: TOPICAL_CREAM | CUTANEOUS | 0 refills | Status: DC

## 2022-02-22 NOTE — Telephone Encounter (Signed)
Patient was called in a seven supply of cephALEXin (KEFLEX) 500 MG capsule. She stated she still have the rash and the pharmacy stated she may need to have something else called in. Please advise. Thank you!

## 2022-02-22 NOTE — Telephone Encounter (Signed)
Last office visit 09/21/2021 for DM. Last refilled 12/11/21 for #12 with no refills.  Last Vit D level 03/06/21 which was normal at 44.60 ng/mL.  CPE scheduled 03/26/22.

## 2022-02-22 NOTE — Telephone Encounter (Signed)
Ms. Spitzley notified as instructed by telephone.  She states she doesn't have any transportation to get to an appointment and she doesn't have a way to do a virtual where Dr. Diona Browner could see the rash.  She ask if Dr. Diona Browner would refill the triamcinolone cream instead.  She states the cream seemed to help better that the antibiotic did anyway.  Refill sent in for triamcinolone cream to [facility] on [address].  Okayed per Dr. Diona Browner.

## 2022-02-22 NOTE — Telephone Encounter (Signed)
Rash likely not bacterial if not better with.  I have not seen her for this recently.  She needs to return for follow-up in rash or I can refer her to dermatology, her preference

## 2022-02-22 NOTE — Addendum Note (Signed)
Addended by: Carter Kitten on: 02/22/2022 05:06 PM   Modules accepted: Orders

## 2022-03-04 ENCOUNTER — Other Ambulatory Visit: Payer: Self-pay | Admitting: Family Medicine

## 2022-03-04 DIAGNOSIS — F419 Anxiety disorder, unspecified: Secondary | ICD-10-CM

## 2022-03-04 DIAGNOSIS — F5104 Psychophysiologic insomnia: Secondary | ICD-10-CM

## 2022-03-04 NOTE — Telephone Encounter (Signed)
Last office visit 09/21/21 for DM.  Last refilled 01/28/22 for #90 with no refills.  CPE scheduled 03/21/22.

## 2022-03-13 ENCOUNTER — Ambulatory Visit (INDEPENDENT_AMBULATORY_CARE_PROVIDER_SITE_OTHER): Payer: Medicare Other

## 2022-03-13 VITALS — Wt 200.0 lb

## 2022-03-13 DIAGNOSIS — Z0000 Encounter for general adult medical examination without abnormal findings: Secondary | ICD-10-CM | POA: Diagnosis not present

## 2022-03-13 DIAGNOSIS — Z1231 Encounter for screening mammogram for malignant neoplasm of breast: Secondary | ICD-10-CM

## 2022-03-13 DIAGNOSIS — E2839 Other primary ovarian failure: Secondary | ICD-10-CM | POA: Diagnosis not present

## 2022-03-13 NOTE — Progress Notes (Addendum)
I connected with  Doran Heater on 03/13/22 by a audio enabled telemedicine application and verified that I am speaking with the correct person using two identifiers.  Patient Location: Home  Provider Location: Office/Clinic  I discussed the limitations of evaluation and management by telemedicine. The patient expressed understanding and agreed to proceed.   Subjective:   Virginia Romero is a 66 y.o. female who presents for Medicare Annual (Subsequent) preventive examination.  Review of Systems     Cardiac Risk Factors include: advanced age (>84mn, >>84women);obesity (BMI >30kg/m2);hypertension;diabetes mellitus;dyslipidemia     Objective:    Today's Vitals   03/13/22 1506  Weight: 200 lb (90.7 kg)   Body mass index is 34.33 kg/m.     03/13/2022    3:13 PM 01/02/2020   12:22 AM 03/01/2015    2:44 PM  Advanced Directives  Does Patient Have a Medical Advance Directive? No No No  Would patient like information on creating a medical advance directive? Yes (MAU/Ambulatory/Procedural Areas - Information given) No - Patient declined No - patient declined information    Current Medications (verified) Outpatient Encounter Medications as of 03/13/2022  Medication Sig   ALPRAZolam (XANAX) 1 MG tablet TAKE 1 TABLET BY MOUTH THREE TIMES DAILY AS NEEDED FOR ANXIETY   diclofenac Sodium (VOLTAREN) 1 % GEL Apply 4 g topically 4 (four) times daily as needed.   DULoxetine (CYMBALTA) 30 MG capsule TAKE 3 CAPSULES BY MOUTH ONCE DAILY   hydrochlorothiazide (HYDRODIURIL) 25 MG tablet Take 1 tablet by mouth once daily   levothyroxine (SYNTHROID) 150 MCG tablet Take 1 tablet (150 mcg total) by mouth daily.   losartan (COZAAR) 100 MG tablet Take 1 tablet by mouth once daily   metFORMIN (GLUCOPHAGE) 500 MG tablet TAKE 2 TABLETS BY MOUTH TWICE DAILY WITH A MEAL   metoprolol tartrate (LOPRESSOR) 100 MG tablet Take 1 tablet by mouth twice daily   rosuvastatin (CRESTOR) 20 MG tablet Take 1 tablet by  mouth once daily   triamcinolone cream (KENALOG) 0.5 % APPLY ONE APPLICATION TOPICALLY TWO TIMES DAILY   Vitamin D, Ergocalciferol, (DRISDOL) 1.25 MG (50000 UNIT) CAPS capsule Take 1 capsule by mouth once a week   [DISCONTINUED] cephALEXin (KEFLEX) 500 MG capsule Take 1 capsule (500 mg total) by mouth 3 (three) times daily.   [DISCONTINUED] Metoprolol Succinate (TOPROL XL PO) Take by mouth.   No facility-administered encounter medications on file as of 03/13/2022.    Allergies (verified) Patient has no known allergies.   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   Depression    Diabetes mellitus    Type II   Endometrial polyp    GERD (gastroesophageal reflux disease)    History of ovarian cyst    Hyperlipemia    Hypertension    Osteopenia 09/2016   T score -1.5   Positive colorectal cancer screening using Cologuard test 2022   PTSD (post-traumatic stress disorder)    Shortness of breath dyspnea    with anxiety   Thyroid disease    Past Surgical History:  Procedure Laterality Date   CESAREAN SECTION  '79 AND '86   X2   COLONOSCOPY     with polypectomy   DIAGNOSTIC LAPAROSCOPY  07/17/2006   WITH LYSIS OF PERITUBULAR AND PERIOVARIAN  ADHESIONS, LYSIS OF OMENTAL ADHESIONS, LSO, RIGHT  OVARIAN CYSTECTOMY , HYSTEROSCOPY  WITH EXCISION OF ENDOMETRIAL POLYP...   ORIF HUMERUS FRACTURE Left 03/01/2015   Procedure: OPEN REDUCTION INTERNAL FIXATION (ORIF) PROXIMAL HUMERUS FRACTURE;  Surgeon: Netta Cedars, MD;  Location: [facility];  Service: Orthopedics;  Laterality: Left;   THYROIDECTOMY  2000   TUBAL LIGATION     Family History  Problem Relation Age of Onset   Hypertension Mother    Heart disease Mother    Diabetes Brother    Heart disease Brother    Depression Brother    Hypertension Maternal Grandmother    Heart disease  Maternal Grandmother    Diabetes Maternal Grandfather    Diabetes Paternal Grandfather    Depression Daughter    Colon cancer Neg Hx    Colon polyps Neg Hx    Esophageal cancer Neg Hx    Rectal cancer Neg Hx    Stomach cancer Neg Hx    Social History   Socioeconomic History   Marital status: Widowed    Spouse name: Not on file   Number of children: Not on file   Years of education: Not on file   Highest education level: Not on file  Occupational History   Not on file  Tobacco Use   Smoking status: Some Days    Packs/day: 0.50    Years: 30.00    Total pack years: 15.00    Types: Cigarettes   Smokeless tobacco: Never  Vaping Use   Vaping Use: Never used  Substance and Sexual Activity   Alcohol use: No   Drug use: No   Sexual activity: Never    Comment: 1 partner in last year  Other Topics Concern   Not on file  Social History Narrative   Not on file   Social Determinants of Health   Financial Resource Strain: Low Risk  (03/13/2022)   Overall Financial Resource Strain (CARDIA)    Difficulty of Paying Living Expenses: Not hard at all  Food Insecurity: No Food Insecurity (03/13/2022)   Hunger Vital Sign    Worried About Running Out of Food in the Last Year: Never true    [place] in the Last Year: Never true  Transportation Needs: No Transportation Needs (03/13/2022)   PRAPARE - Hydrologist (Medical): No    Lack of Transportation (Non-Medical): No  Physical Activity: Sufficiently Active (03/13/2022)   Exercise Vital Sign    Days of Exercise per Week: 7 days    Minutes of Exercise per Session: 30 min  Stress: No Stress Concern Present (03/13/2022)   [place]    Feeling of Stress : Not at all  Social Connections: Socially Isolated (03/13/2022)   Social Connection and Isolation Panel [NHANES]    Frequency of Communication with Friends and Family: More than three  times a week    Frequency of Social Gatherings with Friends and Family: Once a week    Attends Religious Services: Never    Marine scientist or Organizations: No    Attends Archivist Meetings: Never    Marital Status: Widowed    Tobacco Counseling Ready to quit: Not Answered Counseling given: Not Answered   Clinical Intake:  Pre-visit preparation completed: Yes  Pain : No/denies pain     BMI - recorded: 34.33 Nutritional Status: BMI > 30  Obese Nutritional Risks: None Diabetes: Yes CBG done?: No Did  pt. bring in CBG monitor from home?: No  How often do you need to have someone help you when you read instructions, pamphlets, or other written materials from your doctor or pharmacy?: 1 - Never  Diabetic?Nutrition Risk Assessment:  Has the patient had any N/V/D within the last 2 months?  No  Does the patient have any non-healing wounds?  No  Has the patient had any unintentional weight loss or weight gain?  No   Diabetes:  Is the patient diabetic?  Yes  If diabetic, was a CBG obtained today?  No  Did the patient bring in their glucometer from home?  No  How often do you monitor your CBG's? N/A.   Financial Strains and Diabetes Management:  Are you having any financial strains with the device, your supplies or your medication? No .  Does the patient want to be seen by Chronic Care Management for management of their diabetes?  No  Would the patient like to be referred to a Nutritionist or for Diabetic Management?  No   Diabetic Exams:  Diabetic Eye Exam: Overdue for diabetic eye exam. Pt has been advised about the importance in completing this exam. Patient advised to call and schedule an eye exam. Diabetic Foot Exam: Overdue, Pt has been advised about the importance in completing this exam. Pt is scheduled for diabetic foot exam on next appt .   Interpreter Needed?: No  Information entered by :: Charlott Rakes, LPN   Activities of Daily  Living    03/13/2022    3:15 PM  In your present state of health, do you have any difficulty performing the following activities:  Hearing? 0  Vision? 0  Difficulty concentrating or making decisions? 0  Walking or climbing stairs? 0  Dressing or bathing? 0  Doing errands, shopping? 0  Preparing Food and eating ? N  Using the Toilet? N  In the past six months, have you accidently leaked urine? Y  Comment urgency at time  Do you have problems with loss of bowel control? N  Managing your Medications? N  Managing your Finances? N  Housekeeping or managing your Housekeeping? N    Patient Care Team: Jinny Sanders, MD as PCP - General (Family Medicine) Huel Cote, NP (Inactive) as Nurse Practitioner (Obstetrics and Gynecology)  Indicate any recent Medical Services you may have received from other than [facility] providers in the past year (date may be approximate).     Assessment:   This is a routine wellness examination for Virginia Romero.  Hearing/Vision screen Hearing Screening - Comments:: Pt denies any hearing issues  Vision Screening - Comments:: Pt follows up with Dr Prudencio Burly for annul eye exams   Dietary issues and exercise activities discussed: Current Exercise Habits: Home exercise routine, Type of exercise: walking, Time (Minutes): 35, Frequency (Times/Week): 7, Weekly Exercise (Minutes/Week): 245   Goals Addressed             This Visit's Progress    Patient Stated       Lose weight and quit smoking       Depression Screen    03/13/2022    3:11 PM 09/21/2021   10:53 AM 03/06/2021   11:32 AM 09/22/2020    3:22 PM 09/01/2020   12:59 PM 08/14/2020   12:40 PM 01/03/2020    9:22 AM  PHQ 2/9 Scores  PHQ - 2 Score 1 0 '4 4 2 4 2  '$ PHQ- 9 Score  0 '10 16 11 18 '$ 4  Fall Risk    03/13/2022    3:15 PM 09/21/2021   10:44 AM 03/06/2021   11:31 AM 07/15/2018    8:59 AM 11/28/2015   11:03 AM  Fall Risk   Falls in the past year? 0 0 1 0 Yes  Number falls in past yr: 0 0 1  1   Injury with Fall? 0 0 1    Risk for fall due to : Impaired mobility No Fall Risks History of fall(s)    Risk for fall due to: Comment sore knee use a cane at times      Follow up Falls prevention discussed Falls evaluation completed Falls evaluation completed      [place]:  Any stairs in or around the home? Yes  If so, are there any without handrails? No  Home free of loose throw rugs in walkways, pet beds, electrical cords, etc? Yes  Adequate lighting in your home to reduce risk of falls? Yes   ASSISTIVE DEVICES UTILIZED TO PREVENT FALLS:  Life alert? No  Use of a cane, walker or w/c? Yes  Grab bars in the bathroom? Yes  Shower chair or bench in shower? No  Elevated toilet seat or a handicapped toilet? No   TIMED UP AND GO:  Was the test performed? No .  Cognitive Function:        03/13/2022    3:18 PM  6CIT Screen  What Year? 0 points  What month? 0 points  What time? 0 points  Count back from 20 0 points  Months in reverse 0 points  Repeat phrase 0 points  Total Score 0 points    Immunizations Immunization History  Administered Date(s) Administered   Influenza, Quadrivalent, Recombinant, Inj, Pf 01/20/2019   Influenza,inj,Quad PF,6+ Mos 04/01/2017, 05/20/2018, 04/17/2020   Influenza-Unspecified 02/23/2021   PFIZER(Purple Top)SARS-COV-2 Vaccination 08/13/2019, 09/03/2019, 05/03/2020   Pneumococcal Conjugate-13 02/16/2014   Pneumococcal Polysaccharide-23 07/28/2019   Tdap 06/03/2006   Zoster Recombinat (Shingrix) 08/05/2020, 12/13/2020    TDAP status: Due, Education has been provided regarding the importance of this vaccine. Advised may receive this vaccine at local pharmacy or Health Dept. Aware to provide a copy of the vaccination record if obtained from local pharmacy or Health Dept. Verbalized acceptance and understanding.  Flu Vaccine status: Due, Education has been provided regarding the importance of this vaccine.  Advised may receive this vaccine at local pharmacy or Health Dept. Aware to provide a copy of the vaccination record if obtained from local pharmacy or Health Dept. Verbalized acceptance and understanding.  Pneumococcal vaccine status: Up to date  Covid-19 vaccine status: Completed vaccines  Qualifies for Shingles Vaccine? Yes   Zostavax completed Yes   Shingrix Completed?: Yes  Screening Tests Health Maintenance  Topic Date Due   TETANUS/TDAP  06/03/2016   Diabetic kidney evaluation - Urine ACR  07/27/2020   DEXA SCAN  09/05/2021   PAP SMEAR-Modifier  11/03/2021   INFLUENZA VACCINE  01/01/2022   OPHTHALMOLOGY EXAM  01/25/2022   Diabetic kidney evaluation - GFR measurement  03/06/2022   FOOT EXAM  03/06/2022   COVID-19 Vaccine (4 - Pfizer series) 10/09/2022 (Originally 06/28/2020)   HEMOGLOBIN A1C  03/23/2022   MAMMOGRAM  05/22/2022   Fecal DNA (Cologuard)  04/03/2024   Pneumonia Vaccine 20+ Years old (77 - PPSV23 or PCV20) 07/27/2024   Hepatitis C Screening  Completed   HIV Screening  Completed   Zoster Vaccines- Shingrix  Completed   HPV  VACCINES  Aged Out   COLONOSCOPY (Pts 45-50yr Insurance coverage will need to be confirmed)  Discontinued    Health Maintenance  Health Maintenance Due  Topic Date Due   TETANUS/TDAP  06/03/2016   Diabetic kidney evaluation - Urine ACR  07/27/2020   DEXA SCAN  09/05/2021   PAP SMEAR-Modifier  11/03/2021   INFLUENZA VACCINE  01/01/2022   OPHTHALMOLOGY EXAM  01/25/2022   Diabetic kidney evaluation - GFR measurement  03/06/2022   FOOT EXAM  03/06/2022    Colorectal cancer screening: Type of screening: Cologuard. Completed 04/03/21. Repeat every 3 years  Mammogram status: Ordered 03/13/22. Pt provided with contact info and advised to call to schedule appt.   Bone Density status: Ordered 03/13/22. Pt provided with contact info and advised to call to schedule appt.  Additional Screening:  Hepatitis C Screening: Completed  01/20/17  Vision Screening: Recommended annual ophthalmology exams for early detection of glaucoma and other disorders of the eye. Is the patient up to date with their annual eye exam?  No  Who is the provider or what is the name of the office in which the patient attends annual eye exams? Dr LPrudencio Burly If pt is not established with a provider, would they like to be referred to a provider to establish care? No .   Dental Screening: Recommended annual dental exams for proper oral hygiene  Community Resource Referral / Chronic Care Management: CRR required this visit?  No   CCM required this visit?  No      Plan:     I have personally reviewed and noted the following in the patient's chart:   Medical and social history Use of alcohol, tobacco or illicit drugs  Current medications and supplements including opioid prescriptions. Patient is not currently taking opioid prescriptions. Functional ability and status Nutritional status Physical activity Advanced directives List of other physicians Hospitalizations, surgeries, and ER visits in previous 12 months Vitals Screenings to include cognitive, depression, and falls Referrals and appointments  In addition, I have reviewed and discussed with patient certain preventive protocols, quality metrics, and best practice recommendations. A written personalized care plan for preventive services as well as general preventive health recommendations were provided to patient.     TWillette Brace LPN   [id number]  Nurse Notes: none

## 2022-03-13 NOTE — Patient Instructions (Signed)
Virginia Romero , Thank you for taking time to come for your Medicare Wellness Visit. I appreciate your ongoing commitment to your health goals. Please review the following plan we discussed and let me know if I can assist you in the future.   These are the goals we discussed:  Goals      Patient Stated     Lose weight and quit smoking        This is a list of the screening recommended for you and due dates:  Health Maintenance  Topic Date Due   Tetanus Vaccine  06/03/2016   Yearly kidney health urinalysis for diabetes  07/27/2020   DEXA scan (bone density measurement)  09/05/2021   Pap Smear  11/03/2021   Flu Shot  01/01/2022   Eye exam for diabetics  01/25/2022   Yearly kidney function blood test for diabetes  03/06/2022   Complete foot exam   03/06/2022   COVID-19 Vaccine (4 - Pfizer series) 10/09/2022*   Hemoglobin A1C  03/23/2022   Mammogram  05/22/2022   Cologuard (Stool DNA test)  04/03/2024   Pneumonia Vaccine (3 - PPSV23 or PCV20) 07/27/2024   Hepatitis C Screening: USPSTF Recommendation to screen - Ages 46-79 yo.  Completed   HIV Screening  Completed   Zoster (Shingles) Vaccine  Completed   HPV Vaccine  Aged Out   Colon Cancer Screening  Discontinued  *Topic was postponed. The date shown is not the original due date.    Advanced directives: Advance directive discussed with you today. I have provided a copy for you to complete at home and have notarized. Once this is complete please bring a copy in to our office so we can scan it into your chart.  Conditions/risks identified: lose weight an stop smoking   Next appointment: Follow up in one year for your annual wellness visit    Preventive Care 65 Years and Older, Female Preventive care refers to lifestyle choices and visits with your health care provider that can promote health and wellness. What does preventive care include? A yearly physical exam. This is also called an annual well check. Dental exams once or  twice a year. Routine eye exams. Ask your health care provider how often you should have your eyes checked. Personal lifestyle choices, including: Daily care of your teeth and gums. Regular physical activity. Eating a healthy diet. Avoiding tobacco and drug use. Limiting alcohol use. Practicing safe sex. Taking low-dose aspirin every day. Taking vitamin and mineral supplements as recommended by your health care provider. What happens during an annual well check? The services and screenings done by your health care provider during your annual well check will depend on your age, overall health, lifestyle risk factors, and family history of disease. Counseling  Your health care provider may ask you questions about your: Alcohol use. Tobacco use. Drug use. Emotional well-being. Home and relationship well-being. Sexual activity. Eating habits. History of falls. Memory and ability to understand (cognition). Work and work Statistician. Reproductive health. Screening  You may have the following tests or measurements: Height, weight, and BMI. Blood pressure. Lipid and cholesterol levels. These may be checked every 5 years, or more frequently if you are over 54 years old. Skin check. Lung cancer screening. You may have this screening every year starting at age 37 if you have a 30-pack-year history of smoking and currently smoke or have quit within the past 15 years. Fecal occult blood test (FOBT) of the stool. You may have this  test every year starting at age 68. Flexible sigmoidoscopy or colonoscopy. You may have a sigmoidoscopy every 5 years or a colonoscopy every 10 years starting at age 18. Hepatitis C blood test. Hepatitis B blood test. Sexually transmitted disease (STD) testing. Diabetes screening. This is done by checking your blood sugar (glucose) after you have not eaten for a while (fasting). You may have this done every 1-3 years. Bone density scan. This is done to screen for  osteoporosis. You may have this done starting at age 54. Mammogram. This may be done every 1-2 years. Talk to your health care provider about how often you should have regular mammograms. Talk with your health care provider about your test results, treatment options, and if necessary, the need for more tests. Vaccines  Your health care provider may recommend certain vaccines, such as: Influenza vaccine. This is recommended every year. Tetanus, diphtheria, and acellular pertussis (Tdap, Td) vaccine. You may need a Td booster every 10 years. Zoster vaccine. You may need this after age 31. Pneumococcal 13-valent conjugate (PCV13) vaccine. One dose is recommended after age 85. Pneumococcal polysaccharide (PPSV23) vaccine. One dose is recommended after age 50. Talk to your health care provider about which screenings and vaccines you need and how often you need them. This information is not intended to replace advice given to you by your health care provider. Make sure you discuss any questions you have with your health care provider. Document Released: 06/16/2015 Document Revised: 02/07/2016 Document Reviewed: 03/21/2015 Elsevier Interactive Patient Education  2017 [place] Prevention in the Home Falls can cause injuries. They can happen to people of all ages. There are many things you can do to make your home safe and to help prevent falls. What can I do on the outside of my home? Regularly fix the edges of walkways and driveways and fix any cracks. Remove anything that might make you trip as you walk through a door, such as a raised step or threshold. Trim any bushes or trees on the path to your home. Use bright outdoor lighting. Clear any walking paths of anything that might make someone trip, such as rocks or tools. Regularly check to see if handrails are loose or broken. Make sure that both sides of any steps have handrails. Any raised decks and porches should have guardrails on  the edges. Have any leaves, snow, or ice cleared regularly. Use sand or salt on walking paths during winter. Clean up any spills in your garage right away. This includes oil or grease spills. What can I do in the bathroom? Use night lights. Install grab bars by the toilet and in the tub and shower. Do not use towel bars as grab bars. Use non-skid mats or decals in the tub or shower. If you need to sit down in the shower, use a plastic, non-slip stool. Keep the floor dry. Clean up any water that spills on the floor as soon as it happens. Remove soap buildup in the tub or shower regularly. Attach bath mats securely with double-sided non-slip rug tape. Do not have throw rugs and other things on the floor that can make you trip. What can I do in the bedroom? Use night lights. Make sure that you have a light by your bed that is easy to reach. Do not use any sheets or blankets that are too big for your bed. They should not hang down onto the floor. Have a firm chair that has side arms. You can  use this for support while you get dressed. Do not have throw rugs and other things on the floor that can make you trip. What can I do in the kitchen? Clean up any spills right away. Avoid walking on wet floors. Keep items that you use a lot in easy-to-reach places. If you need to reach something above you, use a strong step stool that has a grab bar. Keep electrical cords out of the way. Do not use floor polish or wax that makes floors slippery. If you must use wax, use non-skid floor wax. Do not have throw rugs and other things on the floor that can make you trip. What can I do with my stairs? Do not leave any items on the stairs. Make sure that there are handrails on both sides of the stairs and use them. Fix handrails that are broken or loose. Make sure that handrails are as long as the stairways. Check any carpeting to make sure that it is firmly attached to the stairs. Fix any carpet that is loose  or worn. Avoid having throw rugs at the top or bottom of the stairs. If you do have throw rugs, attach them to the floor with carpet tape. Make sure that you have a light switch at the top of the stairs and the bottom of the stairs. If you do not have them, ask someone to add them for you. What else can I do to help prevent falls? Wear shoes that: Do not have high heels. Have rubber bottoms. Are comfortable and fit you well. Are closed at the toe. Do not wear sandals. If you use a stepladder: Make sure that it is fully opened. Do not climb a closed stepladder. Make sure that both sides of the stepladder are locked into place. Ask someone to hold it for you, if possible. Clearly mark and make sure that you can see: Any grab bars or handrails. First and last steps. Where the edge of each step is. Use tools that help you move around (mobility aids) if they are needed. These include: Canes. Walkers. Scooters. Crutches. Turn on the lights when you go into a dark area. Replace any light bulbs as soon as they burn out. Set up your furniture so you have a clear path. Avoid moving your furniture around. If any of your floors are uneven, fix them. If there are any pets around you, be aware of where they are. Review your medicines with your doctor. Some medicines can make you feel dizzy. This can increase your chance of falling. Ask your doctor what other things that you can do to help prevent falls. This information is not intended to replace advice given to you by your health care provider. Make sure you discuss any questions you have with your health care provider. Document Released: 03/16/2009 Document Revised: 10/26/2015 Document Reviewed: 06/24/2014 Elsevier Interactive Patient Education  2017 [employer].

## 2022-03-19 DIAGNOSIS — Z23 Encounter for immunization: Secondary | ICD-10-CM | POA: Diagnosis not present

## 2022-03-21 ENCOUNTER — Ambulatory Visit (INDEPENDENT_AMBULATORY_CARE_PROVIDER_SITE_OTHER): Payer: Medicare Other | Admitting: Family Medicine

## 2022-03-21 ENCOUNTER — Encounter: Payer: Self-pay | Admitting: Family Medicine

## 2022-03-21 ENCOUNTER — Encounter: Payer: Self-pay | Admitting: *Deleted

## 2022-03-21 ENCOUNTER — Other Ambulatory Visit (INDEPENDENT_AMBULATORY_CARE_PROVIDER_SITE_OTHER): Payer: Medicare Other

## 2022-03-21 VITALS — BP 150/70 | HR 65 | Temp 98.8°F | Resp 16 | Ht 61.5 in | Wt 198.1 lb

## 2022-03-21 DIAGNOSIS — E1159 Type 2 diabetes mellitus with other circulatory complications: Secondary | ICD-10-CM

## 2022-03-21 DIAGNOSIS — E559 Vitamin D deficiency, unspecified: Secondary | ICD-10-CM | POA: Diagnosis not present

## 2022-03-21 DIAGNOSIS — Z0000 Encounter for general adult medical examination without abnormal findings: Secondary | ICD-10-CM | POA: Diagnosis not present

## 2022-03-21 DIAGNOSIS — F331 Major depressive disorder, recurrent, moderate: Secondary | ICD-10-CM | POA: Diagnosis not present

## 2022-03-21 DIAGNOSIS — Z6836 Body mass index (BMI) 36.0-36.9, adult: Secondary | ICD-10-CM | POA: Diagnosis not present

## 2022-03-21 DIAGNOSIS — E034 Atrophy of thyroid (acquired): Secondary | ICD-10-CM

## 2022-03-21 DIAGNOSIS — I152 Hypertension secondary to endocrine disorders: Secondary | ICD-10-CM | POA: Diagnosis not present

## 2022-03-21 DIAGNOSIS — E1169 Type 2 diabetes mellitus with other specified complication: Secondary | ICD-10-CM

## 2022-03-21 DIAGNOSIS — E785 Hyperlipidemia, unspecified: Secondary | ICD-10-CM | POA: Diagnosis not present

## 2022-03-21 LAB — COMPREHENSIVE METABOLIC PANEL
ALT: 9 U/L (ref 0–35)
AST: 12 U/L (ref 0–37)
Albumin: 4 g/dL (ref 3.5–5.2)
Alkaline Phosphatase: 83 U/L (ref 39–117)
BUN: 17 mg/dL (ref 6–23)
CO2: 31 mEq/L (ref 19–32)
Calcium: 9.1 mg/dL (ref 8.4–10.5)
Chloride: 102 mEq/L (ref 96–112)
Creatinine, Ser: 0.82 mg/dL (ref 0.40–1.20)
GFR: 74.96 mL/min (ref >60.00)
Glucose, Bld: 100 mg/dL — ABNORMAL HIGH (ref 70–99)
Potassium: 4 mEq/L (ref 3.5–5.1)
Sodium: 143 mEq/L (ref 135–145)
Total Bilirubin: 0.3 mg/dL (ref 0.2–1.2)
Total Protein: 6.3 g/dL (ref 6.0–8.3)

## 2022-03-21 LAB — LIPID PANEL
Cholesterol: 117 mg/dL (ref 0–200)
HDL: 32.4 mg/dL — ABNORMAL LOW (ref >39.00)
LDL Cholesterol: 51 mg/dL (ref 0–99)
NonHDL: 84.36
Total CHOL/HDL Ratio: 4
Triglycerides: 169 mg/dL — ABNORMAL HIGH (ref 0.0–149.0)
VLDL: 33.8 mg/dL (ref 0.0–40.0)

## 2022-03-21 LAB — HM DIABETES FOOT EXAM

## 2022-03-21 LAB — TSH: TSH: 3.3 u[IU]/mL (ref 0.35–5.50)

## 2022-03-21 LAB — VITAMIN D 25 HYDROXY (VIT D DEFICIENCY, FRACTURES): VITD: 50.18 ng/mL (ref 30.00–100.00)

## 2022-03-21 LAB — HEMOGLOBIN A1C: Hgb A1c MFr Bld: 6.7 % — ABNORMAL HIGH (ref 4.6–6.5)

## 2022-03-21 LAB — T4, FREE: Free T4: 0.99 ng/dL (ref 0.60–1.60)

## 2022-03-21 LAB — MICROALBUMIN / CREATININE URINE RATIO
Creatinine,U: 252.5 mg/dL
Microalb Creat Ratio: 1.6 mg/g (ref 0.0–30.0)
Microalb, Ur: 4.1 mg/dL — ABNORMAL HIGH (ref 0.0–1.9)

## 2022-03-21 LAB — T3, FREE: T3, Free: 3.1 pg/mL (ref 2.3–4.2)

## 2022-03-21 MED ORDER — TRIAMCINOLONE ACETONIDE 0.5 % EX CREA: TOPICAL_CREAM | CUTANEOUS | 1 refills | Status: DC

## 2022-03-21 NOTE — Progress Notes (Signed)
Patient ID: Virginia Romero, female    DOB: 07-24-1955, 66 y.o.   MRN: [id number]  This visit was conducted in person.  BP (!) 150/70   Pulse 65   Temp 98.8 F (37.1 C)   Resp 16   Ht 5' 1.5" (1.562 m)   Wt 198 lb 2 oz (89.9 kg)   SpO2 96%   BMI 36.83 kg/m    CC:  Chief Complaint  Patient presents with   Medicare Wellness    Subjective:   HPI: Virginia Romero is a 66 y.o. female presenting on 03/21/2022 for  annual review of chronic health issues.  The patient presents for  and review of chronic health problems. He/She also has the following acute concerns today:  The patient saw a LPN or RN for medicare wellness visit.  Prevention and wellness was reviewed in detail. Note reviewed and important notes copied below.  [facility] Visit from 03/21/2022 in [facility]  PHQ-2 Total Score 3      Hypertension:  Above goal in office today despite losartan 100 mg daily, metoprolol 100 mg twice daily, HCTZ 25 mg daily BP Readings from Last 3 Encounters:  03/21/22 (!) 150/70  09/21/21 138/72  06/15/21 (!) 121/59  Using medication without problems or lightheadedness:  none Chest pain with exertion:none Edema: yes... and chronic skin changes Short of breath: Average home BPs: Other issues:  Diabetes: Due for re-eval on metformin 500 mg 2 tablets twice daily Using medications without difficulties: Hypoglycemic episodes: Hyperglycemic episodes: Feet problems: Blood Sugars averaging: eye exam within last year:  Elevated Cholesterol: In past well-controlled on Crestor 20 mg daily Lab Results  Component Value Date   CHOL 126 03/06/2021   HDL 31.90 (L) 03/06/2021   LDLCALC 68 08/25/2020   LDLDIRECT 67.0 03/06/2021   TRIG 206.0 (H) 03/06/2021   CHOLHDL 4 03/06/2021  Using medications without problems: Muscle aches:  Diet compliance: moderate Exercise: daily walking. Other complaints:  Wt Readings from Last 3 Encounters:  03/21/22  198 lb 2 oz (89.9 kg)  03/13/22 200 lb (90.7 kg)  09/21/21 200 lb 4.8 oz (90.9 kg)     MDD, GAD, chronic insomnia: Tolerable control per patient on Cymbalta 90 mg daily  Puppy helping with mood.   Hypothyroid  levo 150 mcg daily Lab Results  Component Value Date   TSH 8.05 (H) 03/06/2021   She has chronic rash on bilateral legs... intermittent itchiness... was infected in 06/2021.. cellulitis resolved but persistent issues with skin changes.   The patients weight, height, BMI and visual acuity have been recorded in the chart   I have made referrals, counseling and provided education to the patient based review of the above and I have provided the pt with a written personalized care plan for preventive services.       Relevant past medical, surgical, family and social history reviewed and updated as indicated. Interim medical history since our last visit reviewed. Allergies and medications reviewed and updated. Outpatient Medications Prior to Visit  Medication Sig Dispense Refill   ALPRAZolam (XANAX) 1 MG tablet TAKE 1 TABLET BY MOUTH THREE TIMES DAILY AS NEEDED FOR ANXIETY 90 tablet 0   diclofenac Sodium (VOLTAREN) 1 % GEL Apply 4 g topically 4 (four) times daily as needed. 100 g 1   DULoxetine (CYMBALTA) 30 MG capsule TAKE 3 CAPSULES BY MOUTH ONCE DAILY 90 capsule 5   hydrochlorothiazide (HYDRODIURIL) 25 MG tablet Take 1 tablet by  mouth once daily 90 tablet 1   levothyroxine (SYNTHROID) 150 MCG tablet Take 1 tablet (150 mcg total) by mouth daily. 90 tablet 3   losartan (COZAAR) 100 MG tablet Take 1 tablet by mouth once daily 90 tablet 0   metFORMIN (GLUCOPHAGE) 500 MG tablet TAKE 2 TABLETS BY MOUTH TWICE DAILY WITH A MEAL 120 tablet 5   metoprolol tartrate (LOPRESSOR) 100 MG tablet Take 1 tablet by mouth twice daily 180 tablet 1   rosuvastatin (CRESTOR) 20 MG tablet Take 1 tablet by mouth once daily 90 tablet 3   triamcinolone cream (KENALOG) 0.5 % APPLY ONE APPLICATION TOPICALLY  TWO TIMES DAILY 30 g 0   Vitamin D, Ergocalciferol, (DRISDOL) 1.25 MG (50000 UNIT) CAPS capsule Take 1 capsule by mouth once a week 12 capsule 0   No facility-administered medications prior to visit.     Per HPI unless specifically indicated in ROS section below Review of Systems  Constitutional:  Negative for fatigue and fever.  HENT:  Negative for congestion.   Eyes:  Negative for pain.  Respiratory:  Negative for cough and shortness of breath.   Cardiovascular:  Negative for chest pain, palpitations and leg swelling.  Gastrointestinal:  Negative for abdominal pain.  Genitourinary:  Negative for dysuria and vaginal bleeding.  Musculoskeletal:  Negative for back pain.  Skin:  Positive for rash.  Neurological:  Negative for syncope, light-headedness and headaches.  Psychiatric/Behavioral:  Negative for dysphoric mood.    Objective:  BP (!) 150/70   Pulse 65   Temp 98.8 F (37.1 C)   Resp 16   Ht 5' 1.5" (1.562 m)   Wt 198 lb 2 oz (89.9 kg)   SpO2 96%   BMI 36.83 kg/m   Wt Readings from Last 3 Encounters:  03/21/22 198 lb 2 oz (89.9 kg)  03/13/22 200 lb (90.7 kg)  09/21/21 200 lb 4.8 oz (90.9 kg)      Physical Exam Vitals and nursing note reviewed.  Constitutional:      General: She is not in acute distress.    Appearance: Normal appearance. She is well-developed. She is not ill-appearing or toxic-appearing.  HENT:     Head: Normocephalic.     Right Ear: Hearing, tympanic membrane, ear canal and external ear normal.     Left Ear: Hearing, tympanic membrane, ear canal and external ear normal.     Nose: Nose normal.  Eyes:     General: Lids are normal. Lids are everted, no foreign bodies appreciated.     Conjunctiva/sclera: Conjunctivae normal.     Pupils: Pupils are equal, round, and reactive to light.  Neck:     Thyroid: No thyroid mass or thyromegaly.     Vascular: No carotid bruit.     Trachea: Trachea normal.  Cardiovascular:     Rate and Rhythm: Normal rate  and regular rhythm.     Heart sounds: Normal heart sounds, S1 normal and S2 normal. No murmur heard.    No gallop.  Pulmonary:     Effort: Pulmonary effort is normal. No respiratory distress.     Breath sounds: Normal breath sounds. No wheezing, rhonchi or rales.  Abdominal:     General: Bowel sounds are normal. There is no distension or abdominal bruit.     Palpations: Abdomen is soft. There is no fluid wave or mass.     Tenderness: There is no abdominal tenderness. There is no guarding or rebound.     Hernia: No hernia is  present.  Musculoskeletal:     Cervical back: Normal range of motion and neck supple.  Lymphadenopathy:     Cervical: No cervical adenopathy.  Skin:    General: Skin is warm and dry.     Findings: No rash.     Comments: Excoriation and scabs on legs... no erythema... hyperpigmented spots on legs and arms.  Neurological:     Mental Status: She is alert.     Cranial Nerves: No cranial nerve deficit.     Sensory: No sensory deficit.  Psychiatric:        Mood and Affect: Mood is not anxious or depressed.        Speech: Speech normal.        Behavior: Behavior normal. Behavior is cooperative.        Judgment: Judgment normal.       Diabetic foot exam: Normal inspection No skin breakdown No calluses  Normal DP pulses Normal sensation to light touch and monofilament Nails normal  Results for orders placed or performed in visit on 09/21/21  POCT HgB A1C  Result Value Ref Range   Hemoglobin A1C 6.2 (A) 4.0 - 5.6 %   HbA1c POC (<> result, manual entry)     HbA1c, POC (prediabetic range)     HbA1c, POC (controlled diabetic range)       COVID 19 screen:  No recent travel or known exposure to COVID19 The patient denies respiratory symptoms of COVID 19 at this time. The importance of social distancing was discussed today.   Assessment and Plan The patient's preventative maintenance and recommended screening tests for an annual wellness exam were reviewed in  full today. Brought up to date unless services declined.  Counselled on the importance of diet, exercise, and its role in overall health and mortality. The patient's FH and SH was reviewed, including their home life, tobacco status, and drug and alcohol status.   Vaccines:  COVID and flu  uptodate. Pap/DVE: 11/04/2018.. repeat one more time age 8. Mammo: 04/2021,  ordered Bone Density: 2018 osteopenia,repeat due in 2023.. ordered Colon:  Cologuard  2020.. colonoscopy showed 8 polyps.. return in 1 year recommended.  Smoking Status:smoker.. she reduced to 1/2 pack per WEEK and to go to the lab definitely does not ETOH/ drug WUX:LKGM/WNUU  Hep C: done Diabetic kidney evaluation: Ordered GFR and urine microalbumin testing  Problem List Items Addressed This Visit     Class 2 severe obesity with serious comorbidity and body mass index (BMI) of 36.0 to 36.9 in adult [facility])     Associated with DM, HTN and hyperlipidemia Encouraged exercise, weight loss, healthy eating habits.       DM type 2 with diabetic dyslipidemia ([place])    Diet controlled in past due for reevaluation.      Relevant Orders   Hemoglobin A1c   Lipid panel   Comprehensive metabolic panel   Microalbumin / creatinine urine ratio   Hyperlipidemia associated with type 2 diabetes mellitus ([place])    Due for reevaluation Encouraged exercise, weight loss, healthy eating habits.       Hypertension associated with diabetes ([place])    Blood pressure elevated in office today despite medication.  She will continue to follow at home and call if remaining greater than 140/90.   losartan 100 mg daily,  Metoprolol 100 mg twice daily,  HCTZ 25 mg daily      Hypothyroid    Due for reevaluation on levothyroxine 150 mcg daily  Relevant Orders   TSH   T4, free   T3, free   MDD (major depressive disorder), recurrent episode, moderate (HCC)   Vitamin D deficiency   Relevant Orders   VITAMIN D 25 Hydroxy (Vit-D Deficiency,  Fractures)   Other Visit Diagnoses     Medicare annual wellness visit, subsequent    -  Primary          Eliezer Lofts, MD

## 2022-03-21 NOTE — Assessment & Plan Note (Signed)
Associated with DM, HTN and hyperlipidemia Encouraged exercise, weight loss, healthy eating habits.

## 2022-03-21 NOTE — Assessment & Plan Note (Addendum)
Diet controlled in past due for reevaluation.

## 2022-03-21 NOTE — Assessment & Plan Note (Signed)
Due for reevaluation on levothyroxine 150 mcg daily

## 2022-03-21 NOTE — Assessment & Plan Note (Signed)
Blood pressure elevated in office today despite medication.  She will continue to follow at home and call if remaining greater than 140/90.   losartan 100 mg daily,  Metoprolol 100 mg twice daily,  HCTZ 25 mg daily

## 2022-03-21 NOTE — Patient Instructions (Addendum)
Please stop at the lab to have labs drawn.  Continue triamcinolone on legs.  Plan colonoscopy repeat with 2 day prep in early 2024.  Please call the location of your choice from the menu below to schedule your Mammogram and/or Bone Density appointment.    [facility]                      Phone:  [phone], [place] [address]                                                             Services: Traditional and 3D Mammogram, [facility]                 Phone: [phone] [address], [place] [address]    Service: Bone Density ONLY   *this site does NOT perform mammograms  [facility]                        Phone:  [phone] [address]                                  [place], [place] [address]                                            Services:  3D Mammogram and Bone Density

## 2022-03-21 NOTE — Assessment & Plan Note (Signed)
Due for reevaluation Encouraged exercise, weight loss, healthy eating habits.

## 2022-03-26 ENCOUNTER — Encounter: Payer: Medicare Other | Admitting: Family Medicine

## 2022-03-28 ENCOUNTER — Telehealth: Payer: Self-pay | Admitting: Family Medicine

## 2022-03-28 NOTE — Telephone Encounter (Signed)
Noted  

## 2022-03-28 NOTE — Telephone Encounter (Signed)
Patient called in ,she did received the copy of her lab results in the mail. However,she would like a phone call to get clarification of them. She doesn't know what it all means. She did also state that another patients medical information was mistaking sent to her as well with these lab results.

## 2022-03-28 NOTE — Telephone Encounter (Signed)
Incident reported to Cordelia Pen my RN supervisor and a Safety Zone Portal completed.  FYI to Dr. Diona Browner.

## 2022-03-28 NOTE — Telephone Encounter (Signed)
Spoke with Virginia Romero and discussed her lab results and Dr. Rometta Emery comments.  She states there was another patient's information sheet send with her letter.  Will complete safety zone portal.  Patient will shred paper.

## 2022-04-04 ENCOUNTER — Other Ambulatory Visit: Payer: Self-pay | Admitting: Family Medicine

## 2022-04-04 DIAGNOSIS — F32A Depression, unspecified: Secondary | ICD-10-CM

## 2022-04-04 DIAGNOSIS — F5104 Psychophysiologic insomnia: Secondary | ICD-10-CM

## 2022-04-04 DIAGNOSIS — I10 Essential (primary) hypertension: Secondary | ICD-10-CM

## 2022-04-05 NOTE — Telephone Encounter (Signed)
Last office visit 03/21/22 for Virginia Romero.  Last refilled 03/05/22 for #90 with no refills.  No future appointments.

## 2022-04-19 ENCOUNTER — Telehealth: Payer: Self-pay | Admitting: Family Medicine

## 2022-04-19 ENCOUNTER — Encounter: Payer: Self-pay | Admitting: Family Medicine

## 2022-04-19 NOTE — Telephone Encounter (Signed)
Patient called in and stated that a packet was to be mailed out to her. She stated she hasn't received anything and wanted to follow up on it. Thank you!

## 2022-04-26 ENCOUNTER — Other Ambulatory Visit: Payer: Self-pay | Admitting: Family Medicine

## 2022-05-15 ENCOUNTER — Other Ambulatory Visit: Payer: Self-pay | Admitting: Family Medicine

## 2022-05-15 DIAGNOSIS — F32A Depression, unspecified: Secondary | ICD-10-CM

## 2022-05-15 DIAGNOSIS — F5104 Psychophysiologic insomnia: Secondary | ICD-10-CM

## 2022-05-15 NOTE — Telephone Encounter (Signed)
Last office visit 03/21/22 for [place].  Last refilled 04/05/22 for #90 with no refills.  No future appointments.

## 2022-05-24 ENCOUNTER — Other Ambulatory Visit: Payer: Self-pay | Admitting: Family Medicine

## 2022-05-24 DIAGNOSIS — F411 Generalized anxiety disorder: Secondary | ICD-10-CM

## 2022-06-06 ENCOUNTER — Other Ambulatory Visit: Payer: Self-pay | Admitting: Family Medicine

## 2022-06-06 DIAGNOSIS — E785 Hyperlipidemia, unspecified: Secondary | ICD-10-CM

## 2022-06-09 ENCOUNTER — Other Ambulatory Visit: Payer: Self-pay | Admitting: Family Medicine

## 2022-06-09 NOTE — Telephone Encounter (Signed)
Last office visit 03/21/22 [facility].  Last refilled 02/22/22 for #12 with no refills.  Last Vit D Level 03/21/22 which was normal at 50.18 ng/mL.  No future appointments.

## 2022-06-10 ENCOUNTER — Other Ambulatory Visit: Payer: Self-pay | Admitting: Family Medicine

## 2022-06-10 DIAGNOSIS — F5104 Psychophysiologic insomnia: Secondary | ICD-10-CM

## 2022-06-10 DIAGNOSIS — F32A Depression, unspecified: Secondary | ICD-10-CM

## 2022-06-10 NOTE — Telephone Encounter (Signed)
Last office visit 03/21/22 for [place].  Last refilled 05/16/22 for #90 with no refills.  No future appointments.

## 2022-06-19 ENCOUNTER — Encounter: Payer: Self-pay | Admitting: Gastroenterology

## 2022-07-03 DIAGNOSIS — E119 Type 2 diabetes mellitus without complications: Secondary | ICD-10-CM | POA: Diagnosis not present

## 2022-07-03 DIAGNOSIS — H2513 Age-related nuclear cataract, bilateral: Secondary | ICD-10-CM | POA: Diagnosis not present

## 2022-07-03 DIAGNOSIS — H5213 Myopia, bilateral: Secondary | ICD-10-CM | POA: Diagnosis not present

## 2022-07-03 LAB — HM DIABETES EYE EXAM

## 2022-07-16 ENCOUNTER — Other Ambulatory Visit: Payer: Self-pay | Admitting: Family Medicine

## 2022-07-16 DIAGNOSIS — F5104 Psychophysiologic insomnia: Secondary | ICD-10-CM

## 2022-07-16 DIAGNOSIS — F32A Depression, unspecified: Secondary | ICD-10-CM

## 2022-07-16 NOTE — Telephone Encounter (Signed)
Last office visit 03/21/22 for [place].  Last refilled 06/10/2022 for #90 with no refills.  No future appointments.

## 2022-08-21 ENCOUNTER — Other Ambulatory Visit: Payer: Self-pay | Admitting: Family Medicine

## 2022-08-21 DIAGNOSIS — F32A Depression, unspecified: Secondary | ICD-10-CM

## 2022-08-21 DIAGNOSIS — F5104 Psychophysiologic insomnia: Secondary | ICD-10-CM

## 2022-08-21 NOTE — Telephone Encounter (Signed)
Last office visit 03/21/22 for [place].  Last refilled 07/16/2022 for #90 with no refills.  Next Appt: No future appointments.

## 2022-09-02 DIAGNOSIS — Z1231 Encounter for screening mammogram for malignant neoplasm of breast: Secondary | ICD-10-CM | POA: Diagnosis not present

## 2022-09-02 LAB — HM MAMMOGRAPHY

## 2022-09-18 ENCOUNTER — Other Ambulatory Visit: Payer: Self-pay | Admitting: Family Medicine

## 2022-09-18 NOTE — Telephone Encounter (Signed)
Last office visit 03/21/2022 for [facility].  Last refilled 06/10/2022 for #12 with no refills.  Last Vit D level 03/21/2022 which was normal at 50.18 ng/mL.  Next Appt: No future appointments.

## 2022-09-25 ENCOUNTER — Other Ambulatory Visit: Payer: Self-pay | Admitting: Family Medicine

## 2022-09-25 DIAGNOSIS — F32A Depression, unspecified: Secondary | ICD-10-CM

## 2022-09-25 DIAGNOSIS — F5104 Psychophysiologic insomnia: Secondary | ICD-10-CM

## 2022-09-25 NOTE — Telephone Encounter (Signed)
Last office visit 03/21/2022 for [facility].  Last refilled 08/21/2022 for #90 with no refills.  Next Appt: No future appointments.

## 2022-10-21 ENCOUNTER — Other Ambulatory Visit: Payer: Self-pay | Admitting: Family Medicine

## 2022-10-21 DIAGNOSIS — E1169 Type 2 diabetes mellitus with other specified complication: Secondary | ICD-10-CM

## 2022-10-21 NOTE — Telephone Encounter (Signed)
Please call and schedule Diabetes follow up with fasting labs prior.  Dr. Ermalene Searing has wanted her to follow up in April.

## 2022-10-21 NOTE — Telephone Encounter (Signed)
Patient has been scheduled. She stated that she will not be able to come in a week prior due to transportation. Thank you!

## 2022-10-21 NOTE — Telephone Encounter (Signed)
Lvmtcb, no mychart set up to send messages

## 2022-10-25 ENCOUNTER — Other Ambulatory Visit: Payer: Self-pay | Admitting: Family Medicine

## 2022-10-25 DIAGNOSIS — E1169 Type 2 diabetes mellitus with other specified complication: Secondary | ICD-10-CM

## 2022-11-05 ENCOUNTER — Other Ambulatory Visit: Payer: Self-pay | Admitting: Family Medicine

## 2022-11-05 DIAGNOSIS — F5104 Psychophysiologic insomnia: Secondary | ICD-10-CM

## 2022-11-05 DIAGNOSIS — F419 Anxiety disorder, unspecified: Secondary | ICD-10-CM

## 2022-11-05 NOTE — Telephone Encounter (Signed)
LAST APPOINTMENT DATE: 03/21/22 [facility]    NEXT APPOINTMENT DATE: 11/28/2022    LAST REFILL: 09/27/22   QTY: #90 no rf

## 2022-11-07 ENCOUNTER — Other Ambulatory Visit: Payer: Self-pay | Admitting: Family Medicine

## 2022-11-07 DIAGNOSIS — I10 Essential (primary) hypertension: Secondary | ICD-10-CM

## 2022-11-14 ENCOUNTER — Other Ambulatory Visit: Payer: Self-pay | Admitting: Family Medicine

## 2022-11-14 DIAGNOSIS — E1169 Type 2 diabetes mellitus with other specified complication: Secondary | ICD-10-CM

## 2022-11-27 ENCOUNTER — Other Ambulatory Visit: Payer: Self-pay | Admitting: *Deleted

## 2022-11-27 DIAGNOSIS — F411 Generalized anxiety disorder: Secondary | ICD-10-CM

## 2022-11-27 MED ORDER — DULOXETINE HCL 30 MG PO CPEP: 90.0000 mg | ORAL_CAPSULE | Freq: Every day | ORAL | 3 refills | Status: DC

## 2022-11-28 ENCOUNTER — Ambulatory Visit (INDEPENDENT_AMBULATORY_CARE_PROVIDER_SITE_OTHER): Payer: Medicare Other | Admitting: Family Medicine

## 2022-11-28 ENCOUNTER — Encounter: Payer: Self-pay | Admitting: Family Medicine

## 2022-11-28 VITALS — BP 134/84 | HR 55 | Temp 97.9°F | Ht 61.5 in | Wt 187.5 lb

## 2022-11-28 DIAGNOSIS — E785 Hyperlipidemia, unspecified: Secondary | ICD-10-CM | POA: Diagnosis not present

## 2022-11-28 DIAGNOSIS — E1159 Type 2 diabetes mellitus with other circulatory complications: Secondary | ICD-10-CM

## 2022-11-28 DIAGNOSIS — Z7984 Long term (current) use of oral hypoglycemic drugs: Secondary | ICD-10-CM

## 2022-11-28 DIAGNOSIS — I152 Hypertension secondary to endocrine disorders: Secondary | ICD-10-CM | POA: Diagnosis not present

## 2022-11-28 DIAGNOSIS — E1169 Type 2 diabetes mellitus with other specified complication: Secondary | ICD-10-CM

## 2022-11-28 LAB — POCT GLYCOSYLATED HEMOGLOBIN (HGB A1C): Hemoglobin A1C: 6.2 % — AB (ref 4.0–5.6)

## 2022-11-28 NOTE — Assessment & Plan Note (Signed)
Stable, chronic.  Continue current medication.   losartan 100 mg daily,  Metoprolol 100 mg twice daily,  HCTZ 25 mg daily

## 2022-11-28 NOTE — Assessment & Plan Note (Addendum)
Stable, chronic.  Continue current medication. At next office visit if blood sugar control continues to improve along with weight loss we can consider decreasing the dose of metformin.  She does have associated diarrhea with this medication.  metformin 500 mg 2 tablets twice daily

## 2022-11-28 NOTE — Progress Notes (Signed)
Patient ID: Virginia Romero, female    DOB: 04/22/1956, 67 y.o.   MRN: [id number]  This visit was conducted in person.  BP 134/84   Pulse (!) 55   Temp 97.9 F (36.6 C) (Temporal)   Ht 5' 1.5" (1.562 m)   Wt 187 lb 8 oz (85 kg)   SpO2 96%   BMI 34.85 kg/m    CC:  Chief Complaint  Patient presents with   Medical Management of Chronic Issues    Here for DM follow up.    Subjective:   HPI: Virginia Romero is a 67 y.o. female presenting on 11/28/2022 for   diabetes.  Hypertension:  At goal in office today on losartan 100 mg daily, metoprolol 100 mg twice daily, HCTZ 25 mg daily BP Readings from Last 3 Encounters:  11/28/22 134/84  03/21/22 (!) 150/70  09/21/21 138/72  Using medication without problems or lightheadedness:  none Chest pain with exertion:none Edema: yes... and chronic skin changes Short of breath:none Average home BPs: Other issues: not checking   Walking more .Marland Kitchen Has a new dog.  Wt Readings from Last 3 Encounters:  11/28/22 187 lb 8 oz (85 kg)  03/21/22 198 lb 2 oz (89.9 kg)  03/13/22 200 lb (90.7 kg)  Body mass index is 34.85 kg/m.    Diabetes:  good control on metformin 500 mg 2 tablets twice daily Lab Results  Component Value Date   HGBA1C 6.2 (A) 11/28/2022  Using medications without difficulties: Associated diarrhea, not bothersome to her Hypoglycemic episodes: none Hyperglycemic episodes: none Feet problems: no ulcers Blood Sugars averaging: not checking eye exam within last year: Yes    Wt Readings from Last 3 Encounters:  11/28/22 187 lb 8 oz (85 kg)  03/21/22 198 lb 2 oz (89.9 kg)  03/13/22 200 lb (90.7 kg)     She has chronic rash on bilateral legs... intermittent itchiness... was infected in 06/2021.. cellulitis resolved but persistent issues with skin changes. Topical steroid has not helped much.  She cannot afford to see a dermatologist.  She currently applies olive oil as moisturizer.   The patients weight, height, BMI  and visual acuity have been recorded in the chart   I have made referrals, counseling and provided education to the patient based review of the above and I have provided the pt with a written personalized care plan for preventive services.       Relevant past medical, surgical, family and social history reviewed and updated as indicated. Interim medical history since our last visit reviewed. Allergies and medications reviewed and updated. Outpatient Medications Prior to Visit  Medication Sig Dispense Refill   ALPRAZolam (XANAX) 1 MG tablet TAKE 1 TABLET BY MOUTH THREE TIMES DAILY AS NEEDED FOR ANXIETY 90 tablet 0   amoxicillin (AMOXIL) 500 MG capsule Take by mouth.     diclofenac Sodium (VOLTAREN) 1 % GEL Apply 4 g topically 4 (four) times daily as needed. 100 g 1   DULoxetine (CYMBALTA) 30 MG capsule Take 3 capsules (90 mg total) by mouth daily. 90 capsule 3   hydrochlorothiazide (HYDRODIURIL) 25 MG tablet Take 1 tablet by mouth once daily 90 tablet 1   levothyroxine (SYNTHROID) 150 MCG tablet Take 1 tablet by mouth once daily 90 tablet 3   losartan (COZAAR) 100 MG tablet Take 1 tablet by mouth once daily 90 tablet 0   metFORMIN (GLUCOPHAGE) 500 MG tablet TAKE 2 TABLETS BY MOUTH TWICE DAILY WITH A MEAL 120  tablet 0   metoprolol tartrate (LOPRESSOR) 100 MG tablet Take 1 tablet by mouth twice daily 180 tablet 1   rosuvastatin (CRESTOR) 20 MG tablet Take 1 tablet by mouth once daily 90 tablet 3   triamcinolone cream (KENALOG) 0.5 % APPLY ONE APPLICATION TOPICALLY TWO TIMES DAILY 30 g 1   Vitamin D, Ergocalciferol, (DRISDOL) 1.25 MG (50000 UNIT) CAPS capsule Take 1 capsule by mouth once a week 12 capsule 0   No facility-administered medications prior to visit.     Per HPI unless specifically indicated in ROS section below Review of Systems  Constitutional:  Negative for fatigue and fever.  HENT:  Negative for congestion.   Eyes:  Negative for pain.  Respiratory:  Negative for cough and  shortness of breath.   Cardiovascular:  Negative for chest pain, palpitations and leg swelling.  Gastrointestinal:  Negative for abdominal pain.  Genitourinary:  Negative for dysuria and vaginal bleeding.  Musculoskeletal:  Negative for back pain.  Skin:  Positive for rash.  Neurological:  Negative for syncope, light-headedness and headaches.  Psychiatric/Behavioral:  Negative for dysphoric mood.    Objective:  BP 134/84   Pulse (!) 55   Temp 97.9 F (36.6 C) (Temporal)   Ht 5' 1.5" (1.562 m)   Wt 187 lb 8 oz (85 kg)   SpO2 96%   BMI 34.85 kg/m   Wt Readings from Last 3 Encounters:  11/28/22 187 lb 8 oz (85 kg)  03/21/22 198 lb 2 oz (89.9 kg)  03/13/22 200 lb (90.7 kg)      Physical Exam Vitals and nursing note reviewed.  Constitutional:      General: She is not in acute distress.    Appearance: Normal appearance. She is well-developed. She is not ill-appearing or toxic-appearing.  HENT:     Head: Normocephalic.     Right Ear: Hearing, tympanic membrane, ear canal and external ear normal.     Left Ear: Hearing, tympanic membrane, ear canal and external ear normal.     Nose: Nose normal.  Eyes:     General: Lids are normal. Lids are everted, no foreign bodies appreciated.     Conjunctiva/sclera: Conjunctivae normal.     Pupils: Pupils are equal, round, and reactive to light.  Neck:     Thyroid: No thyroid mass or thyromegaly.     Vascular: No carotid bruit.     Trachea: Trachea normal.  Cardiovascular:     Rate and Rhythm: Normal rate and regular rhythm.     Heart sounds: Normal heart sounds, S1 normal and S2 normal. No murmur heard.    No gallop.  Pulmonary:     Effort: Pulmonary effort is normal. No respiratory distress.     Breath sounds: Normal breath sounds. No wheezing, rhonchi or rales.  Abdominal:     General: Bowel sounds are normal. There is no distension or abdominal bruit.     Palpations: Abdomen is soft. There is no fluid wave or mass.     Tenderness:  There is no abdominal tenderness. There is no guarding or rebound.     Hernia: No hernia is present.  Musculoskeletal:     Cervical back: Normal range of motion and neck supple.  Lymphadenopathy:     Cervical: No cervical adenopathy.  Skin:    General: Skin is warm and dry.     Findings: No rash.     Comments: Excoriation and scabs on legs... no erythema... hyperpigmented spots on legs and arms.  Neurological:     Mental Status: She is alert.     Cranial Nerves: No cranial nerve deficit.     Sensory: No sensory deficit.  Psychiatric:        Mood and Affect: Mood is not anxious or depressed.        Speech: Speech normal.        Behavior: Behavior normal. Behavior is cooperative.        Judgment: Judgment normal.       Diabetic foot exam: Normal inspection No skin breakdown No calluses  Normal DP pulses Normal sensation to light touch and monofilament Nails normal  Results for orders placed or performed in visit on 11/28/22  POCT glycosylated hemoglobin (Hb A1C)  Result Value Ref Range   Hemoglobin A1C 6.2 (A) 4.0 - 5.6 %   HbA1c POC (<> result, manual entry)     HbA1c, POC (prediabetic range)     HbA1c, POC (controlled diabetic range)       COVID 19 screen:  No recent travel or known exposure to COVID19 The patient denies respiratory symptoms of COVID 19 at this time. The importance of social distancing was discussed today.   Assessment and Plan  Problem List Items Addressed This Visit     DM type 2 with diabetic dyslipidemia (HCC) - Primary    Stable, chronic.  Continue current medication. At next office visit if blood sugar control continues to improve along with weight loss we can consider decreasing the dose of metformin.  She does have associated diarrhea with this medication.  metformin 500 mg 2 tablets twice daily      Relevant Orders   POCT glycosylated hemoglobin (Hb A1C) (Completed)   Hypertension associated with diabetes (HCC)    Stable, chronic.   Continue current medication.   losartan 100 mg daily,  Metoprolol 100 mg twice daily,  HCTZ 25 mg daily          Kerby Nora, MD

## 2022-12-04 ENCOUNTER — Other Ambulatory Visit: Payer: Self-pay | Admitting: Family Medicine

## 2022-12-04 DIAGNOSIS — F419 Anxiety disorder, unspecified: Secondary | ICD-10-CM

## 2022-12-04 DIAGNOSIS — F5104 Psychophysiologic insomnia: Secondary | ICD-10-CM

## 2022-12-04 NOTE — Telephone Encounter (Signed)
Last office visit 11/28/22 for DM.  Last refilled 11/05/2022 for #90 with no refills.  Next appt: CPE 04/02/2023.

## 2023-01-02 ENCOUNTER — Other Ambulatory Visit: Payer: Self-pay | Admitting: Family Medicine

## 2023-01-08 ENCOUNTER — Ambulatory Visit (INDEPENDENT_AMBULATORY_CARE_PROVIDER_SITE_OTHER): Payer: Medicare Other

## 2023-01-08 ENCOUNTER — Other Ambulatory Visit: Payer: Self-pay | Admitting: Family Medicine

## 2023-01-08 VITALS — Ht 60.0 in | Wt 185.0 lb

## 2023-01-08 DIAGNOSIS — Z0000 Encounter for general adult medical examination without abnormal findings: Secondary | ICD-10-CM | POA: Diagnosis not present

## 2023-01-08 DIAGNOSIS — F5104 Psychophysiologic insomnia: Secondary | ICD-10-CM

## 2023-01-08 DIAGNOSIS — Z1211 Encounter for screening for malignant neoplasm of colon: Secondary | ICD-10-CM | POA: Diagnosis not present

## 2023-01-08 DIAGNOSIS — F32A Anxiety disorder, unspecified: Secondary | ICD-10-CM

## 2023-01-08 NOTE — Telephone Encounter (Signed)
Last office visit 11/25/2022 for DM.  Last refilled 12/06/22 for #90 with no refills.  Next Appt: CPE 04/02/2023.

## 2023-01-08 NOTE — Progress Notes (Signed)
Subjective:   Virginia Romero is a 67 y.o. female who presents for Medicare Annual (Subsequent) preventive examination.  Visit Complete: Virtual  I connected with  Emilie Rutter on 01/08/23 by a audio enabled telemedicine application and verified that I am speaking with the correct person using two identifiers.  Patient Location: Home  Provider Location: Office/Clinic  I discussed the limitations of evaluation and management by telemedicine. The patient  Vital Signs: Unable to obtain new vitals due to this being a telehealth visit. expressed understanding and agreed to proceed.   Review of Systems      Cardiac Risk Factors include: advanced age (>43men, >64 women);hypertension;diabetes mellitus;dyslipidemia;obesity (BMI >30kg/m2);smoking/ tobacco exposure     Objective:    Today's Vitals   01/08/23 0844  Weight: 185 lb (83.9 kg)  Height: 5' (1.524 m)   Body mass index is 36.13 kg/m.     01/08/2023    8:55 AM 03/13/2022    3:13 PM 01/02/2020   12:22 AM 03/01/2015    2:44 PM  Advanced Directives  Does Patient Have a Medical Advance Directive? No No No No  Would patient like information on creating a medical advance directive? No - Patient declined Yes (MAU/Ambulatory/Procedural Areas - Information given) No - Patient declined No - patient declined information    Current Medications (verified) Outpatient Encounter Medications as of 01/08/2023  Medication Sig   ALPRAZolam (XANAX) 1 MG tablet TAKE 1 TABLET BY MOUTH THREE TIMES DAILY AS NEEDED FOR ANXIETY   DULoxetine (CYMBALTA) 30 MG capsule Take 3 capsules (90 mg total) by mouth daily.   hydrochlorothiazide (HYDRODIURIL) 25 MG tablet Take 1 tablet by mouth once daily   levothyroxine (SYNTHROID) 150 MCG tablet Take 1 tablet by mouth once daily   losartan (COZAAR) 100 MG tablet Take 1 tablet by mouth once daily   metFORMIN (GLUCOPHAGE) 500 MG tablet TAKE 2 TABLETS BY MOUTH TWICE DAILY WITH A MEAL   metoprolol tartrate  (LOPRESSOR) 100 MG tablet Take 1 tablet by mouth twice daily   rosuvastatin (CRESTOR) 20 MG tablet Take 1 tablet by mouth once daily   triamcinolone cream (KENALOG) 0.5 % APPLY ONE APPLICATION TOPICALLY TWO TIMES DAILY   amoxicillin (AMOXIL) 500 MG capsule Take by mouth. (Patient not taking: Reported on 01/08/2023)   diclofenac Sodium (VOLTAREN) 1 % GEL Apply 4 g topically 4 (four) times daily as needed. (Patient not taking: Reported on 01/08/2023)   Vitamin D, Ergocalciferol, (DRISDOL) 1.25 MG (50000 UNIT) CAPS capsule Take 1 capsule by mouth once a week (Patient not taking: Reported on 01/08/2023)   [DISCONTINUED] Metoprolol Succinate (TOPROL XL PO) Take by mouth.   No facility-administered encounter medications on file as of 01/08/2023.    Allergies (verified) Patient has no known allergies.   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   Depression    Diabetes mellitus    Type II   Endometrial polyp    GERD (gastroesophageal reflux disease)    History of ovarian cyst    Hyperlipemia    Hypertension    Osteopenia 09/2016   T score -1.5   Positive colorectal cancer screening using Cologuard test 2022   PTSD (post-traumatic stress disorder)    Shortness of breath dyspnea    with anxiety  Thyroid disease    Past Surgical History:  Procedure Laterality Date   CESAREAN SECTION  '79 AND '86   X2   COLONOSCOPY     with polypectomy   DIAGNOSTIC LAPAROSCOPY  07/17/2006   WITH LYSIS OF PERITUBULAR AND PERIOVARIAN ADHESIONS, LYSIS OF OMENTAL ADHESIONS, LSO, RIGHT  OVARIAN CYSTECTOMY , HYSTEROSCOPY  WITH EXCISION OF ENDOMETRIAL POLYP...   ORIF HUMERUS FRACTURE Left 03/01/2015   Procedure: OPEN REDUCTION INTERNAL FIXATION (ORIF) PROXIMAL HUMERUS FRACTURE;  Surgeon: Beverely Low, MD;  Location: [facility] OR;  Service: Orthopedics;  Laterality:  Left;   THYROIDECTOMY  2000   TUBAL LIGATION     Family History  Problem Relation Age of Onset   Hypertension Mother    Heart disease Mother    Diabetes Brother    Heart disease Brother    Depression Brother    Hypertension Maternal Grandmother    Heart disease Maternal Grandmother    Diabetes Maternal Grandfather    Diabetes Paternal Grandfather    Depression Daughter    Colon cancer Neg Hx    Colon polyps Neg Hx    Esophageal cancer Neg Hx    Rectal cancer Neg Hx    Stomach cancer Neg Hx    Social History   Socioeconomic History   Marital status: Widowed    Spouse name: Not on file   Number of children: Not on file   Years of education: Not on file   Highest education level: Not on file  Occupational History   Not on file  Tobacco Use   Smoking status: Some Days    Current packs/day: 0.50    Average packs/day: 0.5 packs/day for 30.0 years (15.0 ttl pk-yrs)    Types: Cigarettes   Smokeless tobacco: Never  Vaping Use   Vaping status: Never Used  Substance and Sexual Activity   Alcohol use: No   Drug use: No   Sexual activity: Never    Comment: 1 partner in last year  Other Topics Concern   Not on file  Social History Narrative   Not on file   Social Determinants of Health   Financial Resource Strain: Low Risk  (01/08/2023)   Overall Financial Resource Strain (CARDIA)    Difficulty of Paying Living Expenses: Not hard at all  Food Insecurity: No Food Insecurity (01/08/2023)   Hunger Vital Sign    Worried About Running Out of Food in the Last Year: Never true    Ran Out of Food in the Last Year: Never true  Transportation Needs: No Transportation Needs (01/08/2023)   PRAPARE - Administrator, Civil Service (Medical): No    Lack of Transportation (Non-Medical): No  Physical Activity: Sufficiently Active (01/08/2023)   Exercise Vital Sign    Days of Exercise per Week: 6 days    Minutes of Exercise per Session: 90 min  Stress: No Stress Concern Present  (01/08/2023)   [employer] of Occupational Health - Occupational Stress Questionnaire    Feeling of Stress : Not at all  Social Connections: Socially Isolated (01/08/2023)   Social Connection and Isolation Panel [NHANES]    Frequency of Communication with Friends and Family: Three times a week    Frequency of Social Gatherings with Friends and Family: Twice a week    Attends Religious Services: Never    Database administrator or Organizations: No    Attends Banker Meetings: Never    Marital Status: Widowed    Tobacco Counseling Ready  to quit: Yes Counseling given: Yes   Clinical Intake:  Pre-visit preparation completed: Yes  Pain : No/denies pain     BMI - recorded: 36.13 Nutritional Status: BMI > 30  Obese Nutritional Risks: None Diabetes: Yes CBG done?: No Did pt. bring in CBG monitor from home?: No  How often do you need to have someone help you when you read instructions, pamphlets, or other written materials from your doctor or pharmacy?: 1 - Never  Interpreter Needed?: No  Information entered by :: C. LPN   Activities of Daily Living    01/08/2023    8:57 AM 03/13/2022    3:15 PM  In your present state of health, do you have any difficulty performing the following activities:  Hearing? 0 0  Vision? 0 0  Difficulty concentrating or making decisions? 1 0  Comment sometimes   Walking or climbing stairs? 0 0  Dressing or bathing? 0 0  Doing errands, shopping? 0 0  Preparing Food and eating ? N N  Using the Toilet? N N  In the past six months, have you accidently leaked urine? N Y  Comment  urgency at time  Do you have problems with loss of bowel control? N N  Managing your Medications? N N  Managing your Finances? N N  Housekeeping or managing your Housekeeping? N N    Patient Care Team: Excell Seltzer, MD as PCP - General (Family Medicine) Harrington Challenger, NP (Inactive) as Nurse Practitioner (Obstetrics and  Gynecology)  Indicate any recent Medical Services you may have received from other than [facility] providers in the past year (date may be approximate).     Assessment:   This is a routine wellness examination for Kendyle.  Hearing/Vision screen Hearing Screening - Comments:: Denies hearing difficulties   Vision Screening - Comments:: Contact - Dr.Lyles - UTD on eye exams  Dietary issues and exercise activities discussed:     Goals Addressed             This Visit's Progress    Patient Stated       Lose 40 pounds       Depression Screen    01/08/2023    8:54 AM 11/28/2022   11:05 AM 03/21/2022   11:38 AM 03/13/2022    3:11 PM 09/21/2021   10:53 AM 03/06/2021   11:32 AM 09/22/2020    3:22 PM  PHQ 2/9 Scores  PHQ - 2 Score 0 4 3 1  0 4 4  PHQ- 9 Score 0 15 7  0 10 16    Fall Risk    01/08/2023    8:57 AM 11/28/2022   11:05 AM 03/13/2022    3:15 PM 09/21/2021   10:44 AM 03/06/2021   11:31 AM  Fall Risk   Falls in the past year? 0 0 0 0 1  Number falls in past yr: 0  0 0 1  Injury with Fall? 0  0 0 1  Risk for fall due to : No Fall Risks  Impaired mobility No Fall Risks History of fall(s)  Risk for fall due to: Comment   sore knee use a cane at times    Follow up Falls prevention discussed;Falls evaluation completed  Falls prevention discussed Falls evaluation completed Falls evaluation completed    MEDICARE RISK AT HOME:  Medicare Risk at Home - 01/08/23 0858     Any stairs in or around the home? Yes    If so, are there any  without handrails? No    Home free of loose throw rugs in walkways, pet beds, electrical cords, etc? Yes    Adequate lighting in your home to reduce risk of falls? Yes    Life alert? No    Use of a cane, walker or w/c? No    Grab bars in the bathroom? Yes    Shower chair or bench in shower? No    Elevated toilet seat or a handicapped toilet? Yes             TIMED UP AND GO:  Was the test performed?  No    Cognitive Function:         01/08/2023    8:58 AM 03/13/2022    3:18 PM  6CIT Screen  What Year? 0 points 0 points  What month? 0 points 0 points  What time? 0 points 0 points  Count back from 20 0 points 0 points  Months in reverse 0 points 0 points  Repeat phrase 0 points 0 points  Total Score 0 points 0 points    Immunizations Immunization History  Administered Date(s) Administered   Influenza, Quadrivalent, Recombinant, Inj, Pf 01/20/2019   Influenza,inj,Quad PF,6+ Mos 04/01/2017, 05/20/2018, 04/17/2020   Influenza-Unspecified 02/23/2021   PFIZER Comirnaty(Gray Top)Covid-19 Tri-Sucrose Vaccine 03/19/2022   PFIZER(Purple Top)SARS-COV-2 Vaccination 08/13/2019, 09/03/2019, 05/03/2020   Pneumococcal Conjugate-13 02/16/2014   Pneumococcal Polysaccharide-23 07/28/2019   Tdap 06/03/2006   Zoster Recombinant(Shingrix) 08/05/2020, 12/13/2020    TDAP status: Due, Education has been provided regarding the importance of this vaccine. Advised may receive this vaccine at local pharmacy or Health Dept. Aware to provide a copy of the vaccination record if obtained from local pharmacy or Health Dept. Verbalized acceptance and understanding.  Flu Vaccine status: Due, Education has been provided regarding the importance of this vaccine. Advised may receive this vaccine at local pharmacy or Health Dept. Aware to provide a copy of the vaccination record if obtained from local pharmacy or Health Dept. Verbalized acceptance and understanding.  Pneumococcal vaccine status: Up to date  Covid-19 vaccine status: Information provided on how to obtain vaccines.   Qualifies for Shingles Vaccine? Yes   Zostavax completed No   Shingrix Completed?: Yes  Screening Tests Health Maintenance  Topic Date Due   DTaP/Tdap/Td (2 - Td or Tdap) 06/03/2016   DEXA SCAN  09/05/2021   COVID-19 Vaccine (5 - 2023-24 season) 05/14/2022   Colonoscopy  06/15/2022   INFLUENZA VACCINE  01/02/2023   Diabetic kidney evaluation - eGFR measurement   03/22/2023   Diabetic kidney evaluation - Urine ACR  03/22/2023   FOOT EXAM  03/22/2023   HEMOGLOBIN A1C  05/30/2023   OPHTHALMOLOGY EXAM  07/04/2023   MAMMOGRAM  09/02/2023   Medicare Annual Wellness (AWV)  01/08/2024   Pneumonia Vaccine 36+ Years old (3 of 3 - PPSV23 or PCV20) 07/27/2024   Hepatitis C Screening  Completed   Zoster Vaccines- Shingrix  Completed   HPV VACCINES  Aged Out   Fecal DNA (Cologuard)  Discontinued    Health Maintenance  Health Maintenance Due  Topic Date Due   DTaP/Tdap/Td (2 - Td or Tdap) 06/03/2016   DEXA SCAN  09/05/2021   COVID-19 Vaccine (5 - 2023-24 season) 05/14/2022   Colonoscopy  06/15/2022   INFLUENZA VACCINE  01/02/2023    Colorectal cancer screening: Referral to GI placed 01/08/23. Pt aware the office will call re: appt.  Mammogram status: Completed 09/02/22. Repeat every year  Bone scan - pt declined.  Lung Cancer Screening: (Low Dose CT Chest recommended if Age 25-80 years, 20 pack-year currently smoking OR have quit w/in 15years.) does qualify.   Lung Cancer Screening Referral: Pt will discuss with PCP  Additional Screening:  Hepatitis C Screening: does qualify; Completed 01/20/17  Vision Screening: Recommended annual ophthalmology exams for early detection of glaucoma and other disorders of the eye. Is the patient up to date with their annual eye exam?  Yes  Who is the provider or what is the name of the office in which the patient attends annual eye exams? Dr.Lyles If pt is not established with a provider, would they like to be referred to a provider to establish care? Yes .   Dental Screening: Recommended annual dental exams for proper oral hygiene  Diabetic Foot Exam: Diabetic Foot Exam: Completed 03/21/22  Community Resource Referral / Chronic Care Management: CRR required this visit?  No   CCM required this visit?  No     Plan:     I have personally reviewed and noted the following in the patient's chart:    Medical and social history Use of alcohol, tobacco or illicit drugs  Current medications and supplements including opioid prescriptions. Patient is not currently taking opioid prescriptions. Functional ability and status Nutritional status Physical activity Advanced directives List of other physicians Hospitalizations, surgeries, and ER visits in previous 12 months Vitals Screenings to include cognitive, depression, and falls Referrals and appointments  In addition, I have reviewed and discussed with patient certain preventive protocols, quality metrics, and best practice recommendations. A written personalized care plan for preventive services as well as general preventive health recommendations were provided to patient.     Maryan Puls, LPN   01/02/9561   After Visit Summary: (Declined) Due to this being a telephonic visit, with patients personalized plan was offered to patient but patient Declined AVS at this time   Nurse Notes: colonoscopy order placed.

## 2023-01-08 NOTE — Patient Instructions (Addendum)
Virginia Romero , Thank you for taking time to come for your Medicare Wellness Visit. I appreciate your ongoing commitment to your health goals. Please review the following plan we discussed and let me know if I can assist you in the future.   Referrals/Orders/Follow-Ups/Clinician Recommendations: Aim for 30 minutes of exercise or brisk walking, 6-8 glasses of water, and 5 servings of fruits and vegetables each day.   If you wish to quit smoking, help is available. For free tobacco cessation program offerings call the [facility] at 918-605-6978 or Live Well Line at (714)565-0470. You may also visit [email or web address] or email livelifewell@[email or web address] .com for more information on other programs.   You may also call 1-800-QUIT-NOW (947 734 1139) or visit www.[place].ch or www.[facility].org for additional resources on smoking cessation.    This is a list of the screening recommended for you and due dates:  Health Maintenance  Topic Date Due   DTaP/Tdap/Td vaccine (2 - Td or Tdap) 06/03/2016   DEXA scan (bone density measurement)  09/05/2021   COVID-19 Vaccine (5 - 2023-24 season) 05/14/2022   Colon Cancer Screening  06/15/2022   Flu Shot  01/02/2023   Yearly kidney function blood test for diabetes  03/22/2023   Yearly kidney health urinalysis for diabetes  03/22/2023   Complete foot exam   03/22/2023   Medicare Annual Wellness Visit  03/22/2023   Hemoglobin A1C  05/30/2023   Eye exam for diabetics  07/04/2023   Mammogram  09/02/2023   Pneumonia Vaccine (3 of 3 - PPSV23 or PCV20) 07/27/2024   Hepatitis C Screening  Completed   Zoster (Shingles) Vaccine  Completed   HPV Vaccine  Aged Out   Cologuard (Stool DNA test)  Discontinued    Advanced directives: (Declined) Advance directive discussed with you today. Even though you declined this today, please call our office should you change your mind, and we can give you the proper paperwork for you to fill out.  Next Medicare  Annual Wellness Visit scheduled for next year: Yes  Preventive Care 56 Years and Older, Female Preventive care refers to lifestyle choices and visits with your health care provider that can promote health and wellness. What does preventive care include? A yearly physical exam. This is also called an annual well check. Dental exams once or twice a year. Routine eye exams. Ask your health care provider how often you should have your eyes checked. Personal lifestyle choices, including: Daily care of your teeth and gums. Regular physical activity. Eating a healthy diet. Avoiding tobacco and drug use. Limiting alcohol use. Practicing safe sex. Taking low-dose aspirin every day. Taking vitamin and mineral supplements as recommended by your health care provider. What happens during an annual well check? The services and screenings done by your health care provider during your annual well check will depend on your age, overall health, lifestyle risk factors, and family history of disease. Counseling  Your health care provider may ask you questions about your: Alcohol use. Tobacco use. Drug use. Emotional well-being. Home and relationship well-being. Sexual activity. Eating habits. History of falls. Memory and ability to understand (cognition). Work and work Astronomer. Reproductive health. Screening  You may have the following tests or measurements: Height, weight, and BMI. Blood pressure. Lipid and cholesterol levels. These may be checked every 5 years, or more frequently if you are over 89 years old. Skin check. Lung cancer screening. You may have this screening every year starting at age 1 if you have a 30-pack-year history  of smoking and currently smoke or have quit within the past 15 years. Fecal occult blood test (FOBT) of the stool. You may have this test every year starting at age 85. Flexible sigmoidoscopy or colonoscopy. You may have a sigmoidoscopy every 5 years or a  colonoscopy every 10 years starting at age 68. Hepatitis C blood test. Hepatitis B blood test. Sexually transmitted disease (STD) testing. Diabetes screening. This is done by checking your blood sugar (glucose) after you have not eaten for a while (fasting). You may have this done every 1-3 years. Bone density scan. This is done to screen for osteoporosis. You may have this done starting at age 31. Mammogram. This may be done every 1-2 years. Talk to your health care provider about how often you should have regular mammograms. Talk with your health care provider about your test results, treatment options, and if necessary, the need for more tests. Vaccines  Your health care provider may recommend certain vaccines, such as: Influenza vaccine. This is recommended every year. Tetanus, diphtheria, and acellular pertussis (Tdap, Td) vaccine. You may need a Td booster every 10 years. Zoster vaccine. You may need this after age 63. Pneumococcal 13-valent conjugate (PCV13) vaccine. One dose is recommended after age 58. Pneumococcal polysaccharide (PPSV23) vaccine. One dose is recommended after age 69. Talk to your health care provider about which screenings and vaccines you need and how often you need them. This information is not intended to replace advice given to you by your health care provider. Make sure you discuss any questions you have with your health care provider. Document Released: 06/16/2015 Document Revised: 02/07/2016 Document Reviewed: 03/21/2015 Elsevier Interactive Patient Education  2017 [employer].  Fall Prevention in the Home Falls can cause injuries. They can happen to people of all ages. There are many things you can do to make your home safe and to help prevent falls. What can I do on the outside of my home? Regularly fix the edges of walkways and driveways and fix any cracks. Remove anything that might make you trip as you walk through a door, such as a raised step or  threshold. Trim any bushes or trees on the path to your home. Use bright outdoor lighting. Clear any walking paths of anything that might make someone trip, such as rocks or tools. Regularly check to see if handrails are loose or broken. Make sure that both sides of any steps have handrails. Any raised decks and porches should have guardrails on the edges. Have any leaves, snow, or ice cleared regularly. Use sand or salt on walking paths during winter. Clean up any spills in your garage right away. This includes oil or grease spills. What can I do in the bathroom? Use night lights. Install grab bars by the toilet and in the tub and shower. Do not use towel bars as grab bars. Use non-skid mats or decals in the tub or shower. If you need to sit down in the shower, use a plastic, non-slip stool. Keep the floor dry. Clean up any water that spills on the floor as soon as it happens. Remove soap buildup in the tub or shower regularly. Attach bath mats securely with double-sided non-slip rug tape. Do not have throw rugs and other things on the floor that can make you trip. What can I do in the bedroom? Use night lights. Make sure that you have a light by your bed that is easy to reach. Do not use any sheets or blankets  that are too big for your bed. They should not hang down onto the floor. Have a firm chair that has side arms. You can use this for support while you get dressed. Do not have throw rugs and other things on the floor that can make you trip. What can I do in the kitchen? Clean up any spills right away. Avoid walking on wet floors. Keep items that you use a lot in easy-to-reach places. If you need to reach something above you, use a strong step stool that has a grab bar. Keep electrical cords out of the way. Do not use floor polish or wax that makes floors slippery. If you must use wax, use non-skid floor wax. Do not have throw rugs and other things on the floor that can make you  trip. What can I do with my stairs? Do not leave any items on the stairs. Make sure that there are handrails on both sides of the stairs and use them. Fix handrails that are broken or loose. Make sure that handrails are as long as the stairways. Check any carpeting to make sure that it is firmly attached to the stairs. Fix any carpet that is loose or worn. Avoid having throw rugs at the top or bottom of the stairs. If you do have throw rugs, attach them to the floor with carpet tape. Make sure that you have a light switch at the top of the stairs and the bottom of the stairs. If you do not have them, ask someone to add them for you. What else can I do to help prevent falls? Wear shoes that: Do not have high heels. Have rubber bottoms. Are comfortable and fit you well. Are closed at the toe. Do not wear sandals. If you use a stepladder: Make sure that it is fully opened. Do not climb a closed stepladder. Make sure that both sides of the stepladder are locked into place. Ask someone to hold it for you, if possible. Clearly mark and make sure that you can see: Any grab bars or handrails. First and last steps. Where the edge of each step is. Use tools that help you move around (mobility aids) if they are needed. These include: Canes. Walkers. Scooters. Crutches. Turn on the lights when you go into a dark area. Replace any light bulbs as soon as they burn out. Set up your furniture so you have a clear path. Avoid moving your furniture around. If any of your floors are uneven, fix them. If there are any pets around you, be aware of where they are. Review your medicines with your doctor. Some medicines can make you feel dizzy. This can increase your chance of falling. Ask your doctor what other things that you can do to help prevent falls. This information is not intended to replace advice given to you by your health care provider. Make sure you discuss any questions you have with your  health care provider. Document Released: 03/16/2009 Document Revised: 10/26/2015 Document Reviewed: 06/24/2014 Elsevier Interactive Patient Education  2017 [employer].

## 2023-01-28 ENCOUNTER — Ambulatory Visit (INDEPENDENT_AMBULATORY_CARE_PROVIDER_SITE_OTHER): Payer: Medicare Other | Admitting: Family Medicine

## 2023-01-28 ENCOUNTER — Encounter: Payer: Self-pay | Admitting: Family Medicine

## 2023-01-28 VITALS — BP 132/84 | HR 76 | Temp 97.4°F | Ht 61.5 in | Wt 187.5 lb

## 2023-01-28 DIAGNOSIS — N764 Abscess of vulva: Secondary | ICD-10-CM | POA: Diagnosis not present

## 2023-01-28 MED ORDER — DOXYCYCLINE HYCLATE 100 MG PO TABS
100.0000 mg | ORAL_TABLET | Freq: Two times a day (BID) | ORAL | 0 refills | Status: DC
Start: 2023-01-28 — End: 2023-04-10

## 2023-01-28 MED ORDER — VITAMIN D (ERGOCALCIFEROL) 1.25 MG (50000 UNIT) PO CAPS: 50000.0000 [IU] | ORAL_CAPSULE | ORAL | 0 refills | Status: DC

## 2023-01-28 NOTE — Progress Notes (Unsigned)
Patient ID: Virginia Romero, female    DOB: 1955-09-08, 67 y.o.   MRN: [id number]  This visit was conducted in person.  BP 132/84 (BP Location: Left Arm, Patient Position: Sitting, Cuff Size: Large)   Pulse 76   Temp (!) 97.4 F (36.3 C) (Temporal)   Ht 5' 1.5" (1.562 m)   Wt 187 lb 8 oz (85 kg)   SpO2 97%   BMI 34.85 kg/m    CC:  Chief Complaint  Patient presents with   Knot on Labia    Subjective:   HPI: Virginia Romero is a 67 y.o. female presenting on 01/28/2023 for Knot on Labia   Hx of DM   New onset lesion x 2 weeks. Area feels bigger and sore.  No discharge, no fever, no flu like symptoms  Has been more sweaty in groin.. vaginally.   No past boils.   She has treated with  warm compresess      Relevant past medical, surgical, family and social history reviewed and updated as indicated. Interim medical history since our last visit reviewed. Allergies and medications reviewed and updated. Outpatient Medications Prior to Visit  Medication Sig Dispense Refill   ALPRAZolam (XANAX) 1 MG tablet TAKE 1 TABLET BY MOUTH THREE TIMES DAILY AS NEEDED FOR ANXIETY 90 tablet 0   DULoxetine (CYMBALTA) 30 MG capsule Take 3 capsules (90 mg total) by mouth daily. 90 capsule 3   hydrochlorothiazide (HYDRODIURIL) 25 MG tablet Take 1 tablet by mouth once daily 90 tablet 1   levothyroxine (SYNTHROID) 150 MCG tablet Take 1 tablet by mouth once daily 90 tablet 3   losartan (COZAAR) 100 MG tablet Take 1 tablet by mouth once daily 90 tablet 0   metFORMIN (GLUCOPHAGE) 500 MG tablet TAKE 2 TABLETS BY MOUTH TWICE DAILY WITH A MEAL 120 tablet 0   metoprolol tartrate (LOPRESSOR) 100 MG tablet Take 1 tablet by mouth twice daily 180 tablet 0   rosuvastatin (CRESTOR) 20 MG tablet Take 1 tablet by mouth once daily 90 tablet 3   triamcinolone cream (KENALOG) 0.5 % APPLY ONE APPLICATION TOPICALLY TWO TIMES DAILY 30 g 1   Vitamin D, Ergocalciferol, (DRISDOL) 1.25 MG (50000 UNIT) CAPS capsule Take 1  capsule by mouth once a week 12 capsule 0   amoxicillin (AMOXIL) 500 MG capsule Take by mouth. (Patient not taking: Reported on 01/08/2023)     diclofenac Sodium (VOLTAREN) 1 % GEL Apply 4 g topically 4 (four) times daily as needed. (Patient not taking: Reported on 01/08/2023) 100 g 1   No facility-administered medications prior to visit.     Per HPI unless specifically indicated in ROS section below Review of Systems  Constitutional:  Negative for fatigue and fever.  HENT:  Negative for congestion.   Eyes:  Negative for pain.  Respiratory:  Negative for cough and shortness of breath.   Cardiovascular:  Negative for chest pain, palpitations and leg swelling.  Gastrointestinal:  Negative for abdominal pain.  Genitourinary:  Negative for dysuria and vaginal bleeding.  Musculoskeletal:  Negative for back pain.  Neurological:  Negative for syncope, light-headedness and headaches.  Psychiatric/Behavioral:  Negative for dysphoric mood.    Objective:  BP 132/84 (BP Location: Left Arm, Patient Position: Sitting, Cuff Size: Large)   Pulse 76   Temp (!) 97.4 F (36.3 C) (Temporal)   Ht 5' 1.5" (1.562 m)   Wt 187 lb 8 oz (85 kg)   SpO2 97%   BMI 34.85 kg/m  Wt Readings from Last 3 Encounters:  01/30/23 188 lb (85.3 kg)  01/28/23 187 lb 8 oz (85 kg)  01/08/23 185 lb (83.9 kg)      Physical Exam Constitutional:      General: She is not in acute distress.    Appearance: Normal appearance. She is well-developed. She is not ill-appearing or toxic-appearing.  HENT:     Head: Normocephalic.     Right Ear: Hearing, tympanic membrane, ear canal and external ear normal. Tympanic membrane is not erythematous, retracted or bulging.     Left Ear: Hearing, tympanic membrane, ear canal and external ear normal. Tympanic membrane is not erythematous, retracted or bulging.     Nose: No mucosal edema or rhinorrhea.     Right Sinus: No maxillary sinus tenderness or frontal sinus tenderness.     Left  Sinus: No maxillary sinus tenderness or frontal sinus tenderness.     Mouth/Throat:     Mouth: Oropharynx is clear and moist and mucous membranes are normal.     Pharynx: Uvula midline.  Eyes:     General: Lids are normal. Lids are everted, no foreign bodies appreciated.     Extraocular Movements: EOM normal.     Conjunctiva/sclera: Conjunctivae normal.     Pupils: Pupils are equal, round, and reactive to light.  Neck:     Thyroid: No thyroid mass or thyromegaly.     Vascular: No carotid bruit.     Trachea: Trachea normal.  Cardiovascular:     Rate and Rhythm: Normal rate and regular rhythm.     Pulses: Normal pulses.     Heart sounds: Normal heart sounds, S1 normal and S2 normal. No murmur heard.    No friction rub. No gallop.  Pulmonary:     Effort: Pulmonary effort is normal. No tachypnea or respiratory distress.     Breath sounds: Normal breath sounds. No decreased breath sounds, wheezing, rhonchi or rales.  Abdominal:     General: Bowel sounds are normal.     Palpations: Abdomen is soft.     Tenderness: There is no abdominal tenderness.  Genitourinary:      Comments: Nonfluctuant right labial abscess approximately 3-1/2 cm in diameter no central head,.  Tender to palpation Minimal surrounding redness No inguinal lymph nodes noted Musculoskeletal:     Cervical back: Normal range of motion and neck supple.  Skin:    General: Skin is warm, dry and intact.     Findings: No rash.  Neurological:     Mental Status: She is alert.  Psychiatric:        Mood and Affect: Mood is not anxious or depressed.        Speech: Speech normal.        Behavior: Behavior normal. Behavior is cooperative.        Thought Content: Thought content normal.        Cognition and Memory: Cognition and memory normal.        Judgment: Judgment normal.       Results for orders placed or performed in visit on 11/28/22  POCT glycosylated hemoglobin (Hb A1C)  Result Value Ref Range   Hemoglobin A1C  6.2 (A) 4.0 - 5.6 %   HbA1c POC (<> result, manual entry)     HbA1c, POC (prediabetic range)     HbA1c, POC (controlled diabetic range)      Assessment and Plan  Labial abscess Assessment & Plan: Acute, no current indication for incision and drainage. Will  treat with doxycycline 100 mg p.o. twice daily and frequent warm compresses.  Close follow-up for reevaluation in 2 days.  At that time if symptoms have not improved significantly or drainage occur we will proceed with incision and drainage.  Return to ER precautions provided.   Other orders -     Vitamin D (Ergocalciferol); Take 1 capsule (50,000 Units total) by mouth once a week. (Patient not taking: Reported on 01/30/2023)  Dispense: 12 capsule; Refill: 0 -     Doxycycline Hyclate; Take 1 tablet (100 mg total) by mouth 2 (two) times daily.  Dispense: 20 tablet; Refill: 0    No follow-ups on file.   Kerby Nora, MD

## 2023-01-30 ENCOUNTER — Ambulatory Visit (INDEPENDENT_AMBULATORY_CARE_PROVIDER_SITE_OTHER): Payer: Medicare Other | Admitting: Family Medicine

## 2023-01-30 ENCOUNTER — Encounter: Payer: Self-pay | Admitting: Family Medicine

## 2023-01-30 VITALS — BP 140/72 | HR 68 | Temp 98.5°F | Ht 61.5 in | Wt 188.0 lb

## 2023-01-30 DIAGNOSIS — N764 Abscess of vulva: Secondary | ICD-10-CM

## 2023-01-30 NOTE — Assessment & Plan Note (Signed)
Acute, no current indication for incision and drainage. Will treat with doxycycline 100 mg p.o. twice daily and frequent warm compresses.  Close follow-up for reevaluation in 2 days.  At that time if symptoms have not improved significantly or drainage occur we will proceed with incision and drainage.  Return to ER precautions provided.

## 2023-01-30 NOTE — Progress Notes (Signed)
Patient ID: Virginia Romero, female    DOB: Aug 02, 1955, 67 y.o.   MRN: [id number]  This visit was conducted in person.  BP (!) 140/72 (BP Location: Left Arm, Patient Position: Sitting, Cuff Size: Large)   Pulse 68   Temp 98.5 F (36.9 C) (Temporal)   Ht 5' 1.5" (1.562 m)   Wt 188 lb (85.3 kg)   SpO2 98%   BMI 34.95 kg/m    CC:  Chief Complaint  Patient presents with   Vaginal Abscess    Follow up     Subjective:   HPI: [name] is a 67 y.o. female presenting on 01/30/2023 for Vaginal Abscess (Follow up/)  She is on day  2 days ago.doxycycline.  Has been doing warm compresses.  No fever, no flu like symptoms.   some improvement in size or pain level..  But area still uncomfortable no discharge.        Relevant past medical, surgical, family and social history reviewed and updated as indicated. Interim medical history since our last visit reviewed. Allergies and medications reviewed and updated. Outpatient Medications Prior to Visit  Medication Sig Dispense Refill   ALPRAZolam (XANAX) 1 MG tablet TAKE 1 TABLET BY MOUTH THREE TIMES DAILY AS NEEDED FOR ANXIETY 90 tablet 0   doxycycline (VIBRA-TABS) 100 MG tablet Take 1 tablet (100 mg total) by mouth 2 (two) times daily. 20 tablet 0   DULoxetine (CYMBALTA) 30 MG capsule Take 3 capsules (90 mg total) by mouth daily. 90 capsule 3   hydrochlorothiazide (HYDRODIURIL) 25 MG tablet Take 1 tablet by mouth once daily 90 tablet 1   levothyroxine (SYNTHROID) 150 MCG tablet Take 1 tablet by mouth once daily 90 tablet 3   losartan (COZAAR) 100 MG tablet Take 1 tablet by mouth once daily 90 tablet 0   metFORMIN (GLUCOPHAGE) 500 MG tablet TAKE 2 TABLETS BY MOUTH TWICE DAILY WITH A MEAL 120 tablet 0   metoprolol tartrate (LOPRESSOR) 100 MG tablet Take 1 tablet by mouth twice daily 180 tablet 0   rosuvastatin (CRESTOR) 20 MG tablet Take 1 tablet by mouth once daily 90 tablet 3   triamcinolone cream (KENALOG) 0.5 % APPLY ONE  APPLICATION TOPICALLY TWO TIMES DAILY 30 g 1   Vitamin D, Ergocalciferol, (DRISDOL) 1.25 MG (50000 UNIT) CAPS capsule Take 1 capsule (50,000 Units total) by mouth once a week. (Patient not taking: Reported on 01/30/2023) 12 capsule 0   No facility-administered medications prior to visit.     Per HPI unless specifically indicated in ROS section below Review of Systems  Constitutional:  Negative for fatigue and fever.  HENT:  Negative for congestion.   Eyes:  Negative for pain.  Respiratory:  Negative for cough and shortness of breath.   Cardiovascular:  Negative for chest pain, palpitations and leg swelling.  Gastrointestinal:  Negative for abdominal pain.  Genitourinary:  Negative for dysuria and vaginal bleeding.  Musculoskeletal:  Negative for back pain.  Neurological:  Negative for syncope, light-headedness and headaches.  Psychiatric/Behavioral:  Negative for dysphoric mood.    Objective:  BP (!) 140/72 (BP Location: Left Arm, Patient Position: Sitting, Cuff Size: Large)   Pulse 68   Temp 98.5 F (36.9 C) (Temporal)   Ht 5' 1.5" (1.562 m)   Wt 188 lb (85.3 kg)   SpO2 98%   BMI 34.95 kg/m   Wt Readings from Last 3 Encounters:  01/30/23 188 lb (85.3 kg)  01/28/23 187 lb 8 oz (85  kg)  01/08/23 185 lb (83.9 kg)      Physical Exam Constitutional:      General: She is not in acute distress.    Appearance: Normal appearance. She is well-developed. She is not ill-appearing or toxic-appearing.  HENT:     Head: Normocephalic.     Right Ear: Hearing, tympanic membrane, ear canal and external ear normal. Tympanic membrane is not erythematous, retracted or bulging.     Left Ear: Hearing, tympanic membrane, ear canal and external ear normal. Tympanic membrane is not erythematous, retracted or bulging.     Nose: No mucosal edema or rhinorrhea.     Right Sinus: No maxillary sinus tenderness or frontal sinus tenderness.     Left Sinus: No maxillary sinus tenderness or frontal sinus  tenderness.     Mouth/Throat:     Mouth: Oropharynx is clear and moist and mucous membranes are normal.     Pharynx: Uvula midline.  Eyes:     General: Lids are normal. Lids are everted, no foreign bodies appreciated.     Extraocular Movements: EOM normal.     Conjunctiva/sclera: Conjunctivae normal.     Pupils: Pupils are equal, round, and reactive to light.  Neck:     Thyroid: No thyroid mass or thyromegaly.     Vascular: No carotid bruit.     Trachea: Trachea normal.  Cardiovascular:     Rate and Rhythm: Normal rate and regular rhythm.     Pulses: Normal pulses.     Heart sounds: Normal heart sounds, S1 normal and S2 normal. No murmur heard.    No friction rub. No gallop.  Pulmonary:     Effort: Pulmonary effort is normal. No tachypnea or respiratory distress.     Breath sounds: Normal breath sounds. No decreased breath sounds, wheezing, rhonchi or rales.  Abdominal:     General: Bowel sounds are normal.     Palpations: Abdomen is soft.     Tenderness: There is no abdominal tenderness.  Genitourinary:      Comments: Interval improvement in abscess, decrease in size by about 50%, minimal fluctuance and central area appears to be coming to a head Musculoskeletal:     Cervical back: Normal range of motion and neck supple.  Skin:    General: Skin is warm, dry and intact.     Findings: No rash.  Neurological:     Mental Status: She is alert.  Psychiatric:        Mood and Affect: Mood is not anxious or depressed.        Speech: Speech normal.        Behavior: Behavior normal. Behavior is cooperative.        Thought Content: Thought content normal.        Cognition and Memory: Cognition and memory normal.        Judgment: Judgment normal.       Results for orders placed or performed in visit on 11/28/22  POCT glycosylated hemoglobin (Hb A1C)  Result Value Ref Range   Hemoglobin A1C 6.2 (A) 4.0 - 5.6 %   HbA1c POC (<> result, manual entry)     HbA1c, POC (prediabetic  range)     HbA1c, POC (controlled diabetic range)      Assessment and Plan  There are no diagnoses linked to this encounter.  No follow-ups on file.   Kerby Nora, MD

## 2023-01-30 NOTE — Assessment & Plan Note (Signed)
Acute, given significant interval improvement with warm compresses and antibiotics in the last 48 hours we will continue with this plan.  Will hold off on incision and drainage at this time given interval improvement but patient was instructed to call if symptoms change, increased pain etc.  Doxycycline 100 mg p.o. twice daily for another 8 days Warm compresses 3 times daily. Can apply topical antibiotic ointment such as Neosporin.

## 2023-02-07 ENCOUNTER — Other Ambulatory Visit: Payer: Self-pay | Admitting: Family Medicine

## 2023-02-07 DIAGNOSIS — F419 Anxiety disorder, unspecified: Secondary | ICD-10-CM

## 2023-02-07 DIAGNOSIS — F5104 Psychophysiologic insomnia: Secondary | ICD-10-CM

## 2023-02-07 NOTE — Telephone Encounter (Signed)
Last office visit 01/30/2023 for labial abscess.  Last refilled 01/09/23 for #90 with no refills.  Next Appt: CPE 04/02/2023.

## 2023-02-09 ENCOUNTER — Other Ambulatory Visit: Payer: Self-pay | Admitting: Family Medicine

## 2023-02-09 DIAGNOSIS — E1169 Type 2 diabetes mellitus with other specified complication: Secondary | ICD-10-CM

## 2023-03-11 ENCOUNTER — Other Ambulatory Visit: Payer: Self-pay | Admitting: Family Medicine

## 2023-03-11 DIAGNOSIS — F419 Anxiety disorder, unspecified: Secondary | ICD-10-CM

## 2023-03-11 DIAGNOSIS — F5104 Psychophysiologic insomnia: Secondary | ICD-10-CM

## 2023-03-11 NOTE — Telephone Encounter (Signed)
LAST APPOINTMENT DATE: 11/28/22 DM f/u    NEXT APPOINTMENT DATE: 04/02/2023    LAST REFILL: 02/17/23  QTY: #90 no rf

## 2023-04-02 ENCOUNTER — Encounter: Payer: Medicare Other | Admitting: Family Medicine

## 2023-04-10 ENCOUNTER — Other Ambulatory Visit: Payer: Self-pay | Admitting: Family Medicine

## 2023-04-10 ENCOUNTER — Encounter: Payer: Self-pay | Admitting: Family Medicine

## 2023-04-10 ENCOUNTER — Ambulatory Visit: Payer: Medicare Other | Admitting: Family Medicine

## 2023-04-10 VITALS — BP 130/76 | HR 54 | Temp 97.3°F | Ht 61.75 in | Wt 194.0 lb

## 2023-04-10 DIAGNOSIS — F419 Anxiety disorder, unspecified: Secondary | ICD-10-CM | POA: Diagnosis not present

## 2023-04-10 DIAGNOSIS — E034 Atrophy of thyroid (acquired): Secondary | ICD-10-CM | POA: Diagnosis not present

## 2023-04-10 DIAGNOSIS — E1159 Type 2 diabetes mellitus with other circulatory complications: Secondary | ICD-10-CM

## 2023-04-10 DIAGNOSIS — Z7984 Long term (current) use of oral hypoglycemic drugs: Secondary | ICD-10-CM

## 2023-04-10 DIAGNOSIS — E1169 Type 2 diabetes mellitus with other specified complication: Secondary | ICD-10-CM

## 2023-04-10 DIAGNOSIS — F331 Major depressive disorder, recurrent, moderate: Secondary | ICD-10-CM

## 2023-04-10 DIAGNOSIS — F411 Generalized anxiety disorder: Secondary | ICD-10-CM

## 2023-04-10 DIAGNOSIS — E559 Vitamin D deficiency, unspecified: Secondary | ICD-10-CM | POA: Diagnosis not present

## 2023-04-10 DIAGNOSIS — M8580 Other specified disorders of bone density and structure, unspecified site: Secondary | ICD-10-CM

## 2023-04-10 DIAGNOSIS — E785 Hyperlipidemia, unspecified: Secondary | ICD-10-CM | POA: Diagnosis not present

## 2023-04-10 DIAGNOSIS — Z720 Tobacco use: Secondary | ICD-10-CM | POA: Diagnosis not present

## 2023-04-10 DIAGNOSIS — M8589 Other specified disorders of bone density and structure, multiple sites: Secondary | ICD-10-CM

## 2023-04-10 DIAGNOSIS — Z23 Encounter for immunization: Secondary | ICD-10-CM | POA: Diagnosis not present

## 2023-04-10 DIAGNOSIS — I152 Hypertension secondary to endocrine disorders: Secondary | ICD-10-CM | POA: Diagnosis not present

## 2023-04-10 DIAGNOSIS — F5104 Psychophysiologic insomnia: Secondary | ICD-10-CM

## 2023-04-10 DIAGNOSIS — Z6836 Body mass index (BMI) 36.0-36.9, adult: Secondary | ICD-10-CM

## 2023-04-10 DIAGNOSIS — E66812 Obesity, class 2: Secondary | ICD-10-CM

## 2023-04-10 DIAGNOSIS — L602 Onychogryphosis: Secondary | ICD-10-CM

## 2023-04-10 LAB — COMPREHENSIVE METABOLIC PANEL
ALT: 8 U/L (ref 0–35)
AST: 12 U/L (ref 0–37)
Albumin: 4.5 g/dL (ref 3.5–5.2)
Alkaline Phosphatase: 81 U/L (ref 39–117)
BUN: 16 mg/dL (ref 6–23)
CO2: 29 meq/L (ref 19–32)
Calcium: 9.1 mg/dL (ref 8.4–10.5)
Chloride: 101 meq/L (ref 96–112)
Creatinine, Ser: 1.16 mg/dL (ref 0.40–1.20)
GFR: 49.08 mL/min — ABNORMAL LOW (ref >60.00)
Glucose, Bld: 82 mg/dL (ref 70–99)
Potassium: 4.2 meq/L (ref 3.5–5.1)
Sodium: 140 meq/L (ref 135–145)
Total Bilirubin: 0.6 mg/dL (ref 0.2–1.2)
Total Protein: 7 g/dL (ref 6.0–8.3)

## 2023-04-10 LAB — T3, FREE: T3, Free: 2.8 pg/mL (ref 2.3–4.2)

## 2023-04-10 LAB — MICROALBUMIN / CREATININE URINE RATIO
Creatinine,U: 524.3 mg/dL
Microalb Creat Ratio: 4.4 mg/g (ref 0.0–30.0)
Microalb, Ur: 23 mg/dL — ABNORMAL HIGH (ref 0.0–1.9)

## 2023-04-10 LAB — LIPID PANEL
Cholesterol: 127 mg/dL (ref 0–200)
HDL: 38.1 mg/dL — ABNORMAL LOW (ref >39.00)
LDL Cholesterol: 54 mg/dL (ref 0–99)
NonHDL: 89.29
Total CHOL/HDL Ratio: 3
Triglycerides: 178 mg/dL — ABNORMAL HIGH (ref 0.0–149.0)
VLDL: 35.6 mg/dL (ref 0.0–40.0)

## 2023-04-10 LAB — HM DIABETES FOOT EXAM

## 2023-04-10 LAB — VITAMIN D 25 HYDROXY (VIT D DEFICIENCY, FRACTURES): VITD: 36.93 ng/mL (ref 30.00–100.00)

## 2023-04-10 LAB — T4, FREE: Free T4: 0.34 ng/dL — ABNORMAL LOW (ref 0.60–1.60)

## 2023-04-10 LAB — TSH: TSH: 54.39 u[IU]/mL — ABNORMAL HIGH (ref 0.35–5.50)

## 2023-04-10 MED ORDER — METOPROLOL TARTRATE 100 MG PO TABS: 100.0000 mg | ORAL_TABLET | Freq: Two times a day (BID) | ORAL | 3 refills | Status: DC

## 2023-04-10 MED ORDER — ALPRAZOLAM 1 MG PO TABS: 1.0000 mg | ORAL_TABLET | Freq: Three times a day (TID) | ORAL | 0 refills | Status: DC | PRN

## 2023-04-10 MED ORDER — DULOXETINE HCL 30 MG PO CPEP: 90.0000 mg | ORAL_CAPSULE | Freq: Every day | ORAL | 3 refills | Status: DC

## 2023-04-10 NOTE — Assessment & Plan Note (Signed)
Stable, chronic.  Continue current medication.   losartan 100 mg daily,  Metoprolol 100 mg twice daily,  HCTZ 25 mg daily

## 2023-04-10 NOTE — Assessment & Plan Note (Addendum)
Due for reevaluation Encouraged exercise, weight loss, healthy eating habits.  On crestor 20 mg daily

## 2023-04-10 NOTE — Assessment & Plan Note (Signed)
Due for re-val.  metformin 500 mg 2 tablets twice daily

## 2023-04-10 NOTE — Patient Instructions (Addendum)
Restart Cymbalta 30 mg daily.. increase to 90 mg daily.  Please stop at the lab to have labs drawn.   When you schedule mammogram.. ask them to also set up bone density in 2025  Call to  set colonoscopy [facility]  [phone]

## 2023-04-10 NOTE — Progress Notes (Signed)
Patient ID: Virginia Romero, female    DOB: 1955-10-01, 67 y.o.   MRN: [id number]  This visit was conducted in person.  BP 130/76 (BP Location: Right Arm, Patient Position: Sitting, Cuff Size: Large)   Pulse (!) 54   Temp (!) 97.3 F (36.3 C) (Temporal)   Ht 5' 1.75" (1.568 m)   Wt 194 lb (88 kg)   SpO2 99%   BMI 35.77 kg/m    CC:  Chief Complaint  Patient presents with   Annual Exam    Part 2    Subjective:   HPI: Virginia Romero is a 67 y.o. female presenting on 04/10/2023 for  annual review of chronic health issues.  The patient presents for  and review of chronic health problems. He/She also has the following acute concerns today:  The patient saw a LPN or RN for medicare wellness visit. 01/08/2023  Prevention and wellness was reviewed in detail. Note reviewed and important notes copied below.  Flowsheet Row Clinical Support from 01/08/2023 in [facility] [place]  PHQ-2 Total Score 0      Hypertension:  Above goal in office today despite losartan 100 mg daily, metoprolol 100 mg twice daily, HCTZ 25 mg daily BP Readings from Last 3 Encounters:  04/10/23 130/76  01/30/23 (!) 140/72  01/28/23 132/84  Using medication without problems or lightheadedness:  none Chest pain with exertion:none Edema: yes... and chronic skin changes Short of breath: Average home BPs: Other issues:  Diabetes: Due for re-eval on metformin 500 mg 2 tablets twice daily Lab Results  Component Value Date   HGBA1C 6.2 (A) 11/28/2022  Using medications without difficulties: Hypoglycemic episodes: Hyperglycemic episodes: Feet problems: Blood Sugars averaging: eye exam within last year:  Elevated Cholesterol: In past well-controlled on Crestor 20 mg daily Lab Results  Component Value Date   CHOL 117 03/21/2022   HDL 32.40 (L) 03/21/2022   LDLCALC 51 03/21/2022   LDLDIRECT 67.0 03/06/2021   TRIG 169.0 (H) 03/21/2022   CHOLHDL 4 03/21/2022  Using medications  without problems: Muscle aches:  Diet compliance: moderate Exercise: daily walking. Other complaints:  Wt Readings from Last 3 Encounters:  04/10/23 194 lb (88 kg)  01/30/23 188 lb (85.3 kg)  01/28/23 187 lb 8 oz (85 kg)     MDD, GAD, chronic insomnia:  Worse lately but had stopped Cymbalta   Having trouble affording meds,  transportation issue. Was on Cymbalta 30 mg daily     01/08/2023    8:54 AM 11/28/2022   11:05 AM 03/21/2022   11:38 AM  PHQ9 SCORE ONLY  PHQ-9 Total Score 0 15 7       Hypothyroid  levo 150 mcg daily Lab Results  Component Value Date   TSH 3.30 03/21/2022    The patients weight, height, BMI and visual acuity have been recorded in the chart   I have made referrals, counseling and provided education to the patient based review of the above and I have provided the pt with a written personalized care plan for preventive services.       Relevant past medical, surgical, family and social history reviewed and updated as indicated. Interim medical history since our last visit reviewed. Allergies and medications reviewed and updated. Outpatient Medications Prior to Visit  Medication Sig Dispense Refill   ALPRAZolam (XANAX) 1 MG tablet TAKE 1 TABLET BY MOUTH THREE TIMES DAILY AS NEEDED FOR ANXIETY 90 tablet 0   DULoxetine (CYMBALTA) 30  MG capsule Take 3 capsules (90 mg total) by mouth daily. 90 capsule 3   hydrochlorothiazide (HYDRODIURIL) 25 MG tablet Take 1 tablet by mouth once daily 90 tablet 1   levothyroxine (SYNTHROID) 150 MCG tablet Take 1 tablet by mouth once daily 90 tablet 3   losartan (COZAAR) 100 MG tablet Take 1 tablet by mouth once daily 90 tablet 0   metFORMIN (GLUCOPHAGE) 500 MG tablet TAKE 2 TABLETS BY MOUTH TWICE DAILY WITH A MEAL 120 tablet 5   metoprolol tartrate (LOPRESSOR) 100 MG tablet Take 1 tablet by mouth twice daily 180 tablet 0   rosuvastatin (CRESTOR) 20 MG tablet Take 1 tablet by mouth once daily 90 tablet 3   Vitamin D,  Ergocalciferol, (DRISDOL) 1.25 MG (50000 UNIT) CAPS capsule Take 1 capsule (50,000 Units total) by mouth once a week. (Patient not taking: Reported on 01/30/2023) 12 capsule 0   doxycycline (VIBRA-TABS) 100 MG tablet Take 1 tablet (100 mg total) by mouth 2 (two) times daily. 20 tablet 0   No facility-administered medications prior to visit.     Per HPI unless specifically indicated in ROS section below Review of Systems  Constitutional:  Negative for fatigue and fever.  HENT:  Negative for congestion.   Eyes:  Negative for pain.  Respiratory:  Negative for cough and shortness of breath.   Cardiovascular:  Negative for chest pain, palpitations and leg swelling.  Gastrointestinal:  Negative for abdominal pain.  Genitourinary:  Negative for dysuria and vaginal bleeding.  Musculoskeletal:  Negative for back pain.  Skin:  Positive for rash.  Neurological:  Negative for syncope, light-headedness and headaches.  Psychiatric/Behavioral:  Negative for dysphoric mood.    Objective:  BP 130/76 (BP Location: Right Arm, Patient Position: Sitting, Cuff Size: Large)   Pulse (!) 54   Temp (!) 97.3 F (36.3 C) (Temporal)   Ht 5' 1.75" (1.568 m)   Wt 194 lb (88 kg)   SpO2 99%   BMI 35.77 kg/m   Wt Readings from Last 3 Encounters:  04/10/23 194 lb (88 kg)  01/30/23 188 lb (85.3 kg)  01/28/23 187 lb 8 oz (85 kg)      Physical Exam Vitals and nursing note reviewed.  Constitutional:      General: She is not in acute distress.    Appearance: Normal appearance. She is well-developed. She is not ill-appearing or toxic-appearing.  HENT:     Head: Normocephalic.     Right Ear: Hearing, tympanic membrane, ear canal and external ear normal.     Left Ear: Hearing, tympanic membrane, ear canal and external ear normal.     Nose: Nose normal.  Eyes:     General: Lids are normal. Lids are everted, no foreign bodies appreciated.     Conjunctiva/sclera: Conjunctivae normal.     Pupils: Pupils are equal,  round, and reactive to light.  Neck:     Thyroid: No thyroid mass or thyromegaly.     Vascular: No carotid bruit.     Trachea: Trachea normal.  Cardiovascular:     Rate and Rhythm: Normal rate and regular rhythm.     Heart sounds: Normal heart sounds, S1 normal and S2 normal. No murmur heard.    No gallop.  Pulmonary:     Effort: Pulmonary effort is normal. No respiratory distress.     Breath sounds: Normal breath sounds. No wheezing, rhonchi or rales.  Abdominal:     General: Bowel sounds are normal. There is no distension or  abdominal bruit.     Palpations: Abdomen is soft. There is no fluid wave or mass.     Tenderness: There is no abdominal tenderness. There is no guarding or rebound.     Hernia: No hernia is present.  Musculoskeletal:     Cervical back: Normal range of motion and neck supple.  Lymphadenopathy:     Cervical: No cervical adenopathy.  Skin:    General: Skin is warm and dry.     Findings: No rash.     Comments: Excoriation and scabs on legs... no erythema... hyperpigmented spots on legs and arms.  Neurological:     Mental Status: She is alert.     Cranial Nerves: No cranial nerve deficit.     Sensory: No sensory deficit.  Psychiatric:        Mood and Affect: Mood is not anxious or depressed.        Speech: Speech normal.        Behavior: Behavior normal. Behavior is cooperative.        Judgment: Judgment normal.       Diabetic foot exam: Normal inspection No skin breakdown No calluses  Normal DP pulses Normal sensation to light touch and monofilament Nails  thicknened Results for orders placed or performed in visit on 04/10/23  HM DIABETES FOOT EXAM  Result Value Ref Range   HM Diabetic Foot Exam done      COVID 19 screen:  No recent travel or known exposure to COVID19 The patient denies respiratory symptoms of COVID 19 at this time. The importance of social distancing was discussed today.   Assessment and Plan The patient's preventative  maintenance and recommended screening tests for an annual wellness exam were reviewed in full today. Brought up to date unless services declined.  Counselled on the importance of diet, exercise, and its role in overall health and mortality. The patient's FH and SH was reviewed, including their home life, tobacco status, and drug and alcohol status.   Vaccines:  COVID and PNA  uptodate. Given regular dose flu Pap/DVE: 11/04/2018, nml , neg HPV, no further indicated. Mammo: 09/2022,  nml Bone Density: 2018 osteopenia,repeat due in 2023.Marland Kitchen  OVERDUE Colon:  Cologuard  2023.. colonoscopy showed adenomatous polyps.. return in 1 year recommended.  Smoking Status:smoker.. she reduced to 1/2 pack per WEEK  ETOH/ drug BMW:UXLK/GMWN  Hep C: done Diabetic kidney evaluation: Ordered GFR and urine microalbumin testing  Problem List Items Addressed This Visit     Class 2 severe obesity with serious comorbidity and body mass index (BMI) of 36.0 to 36.9 in adult [facility])     Associated with DM, HTN and hyperlipidemia Encouraged exercise, weight loss, healthy eating habits.  Wt Readings from Last 3 Encounters:  04/10/23 194 lb (88 kg)  01/30/23 188 lb (85.3 kg)  01/28/23 187 lb 8 oz (85 kg)         DM type 2 with diabetic dyslipidemia (HCC) - Primary    Due for re-val.  metformin 500 mg 2 tablets twice daily      GAD (generalized anxiety disorder)   Hyperlipidemia associated with type 2 diabetes mellitus (HCC)    Due for reevaluation Encouraged exercise, weight loss, healthy eating habits.  On crestor 20 mg daily       Hypertension associated with diabetes (HCC)    Stable, chronic.  Continue current medication.   losartan 100 mg daily,  Metoprolol 100 mg twice daily,  HCTZ 25 mg daily  Hypothyroid    Due for reevaluation on levothyroxine 150 mcg daily      MDD (major depressive disorder), recurrent episode, moderate (HCC)    Chronic, improved control Cymbalta 30 mg daily        Tobacco abuse    Smoking cessation instruction/counseling given:  counseled patient on the dangers of tobacco use, advised patient to stop smoking, and reviewed strategies to maximize success       Vitamin D deficiency   Other Visit Diagnoses     Anxiety and depression       Psychophysiological insomnia              Kerby Nora, MD

## 2023-04-10 NOTE — Addendum Note (Signed)
Addended by: Damita Lack on: 04/10/2023 11:25 AM   Modules accepted: Orders

## 2023-04-10 NOTE — Assessment & Plan Note (Signed)
Associated with DM, HTN and hyperlipidemia Encouraged exercise, weight loss, healthy eating habits.  Wt Readings from Last 3 Encounters:  04/10/23 194 lb (88 kg)  01/30/23 188 lb (85.3 kg)  01/28/23 187 lb 8 oz (85 kg)

## 2023-04-10 NOTE — Assessment & Plan Note (Signed)
Due for reevaluation on levothyroxine 150 mcg daily

## 2023-04-10 NOTE — Assessment & Plan Note (Signed)
Smoking cessation instruction/counseling given:  counseled patient on the dangers of tobacco use, advised patient to stop smoking, and reviewed strategies to maximize success 

## 2023-04-10 NOTE — Assessment & Plan Note (Addendum)
Chronic, poor control   restart Cymbalta 30 mg daily

## 2023-04-11 ENCOUNTER — Telehealth: Payer: Self-pay | Admitting: Family Medicine

## 2023-04-11 ENCOUNTER — Other Ambulatory Visit: Payer: Self-pay | Admitting: Family Medicine

## 2023-04-11 ENCOUNTER — Encounter: Payer: Self-pay | Admitting: Gastroenterology

## 2023-04-11 DIAGNOSIS — E034 Atrophy of thyroid (acquired): Secondary | ICD-10-CM

## 2023-04-11 LAB — HEMOGLOBIN A1C: Hgb A1c MFr Bld: 6.6 % — ABNORMAL HIGH (ref 4.6–6.5)

## 2023-04-11 MED ORDER — LEVOTHYROXINE SODIUM 175 MCG PO TABS: 150.0000 ug | ORAL_TABLET | Freq: Every day | ORAL | 11 refills | Status: DC

## 2023-04-11 NOTE — Telephone Encounter (Signed)
Lab results and Dr. Daphine Deutscher comments discussed with patient via telephone.  See result note.

## 2023-04-11 NOTE — Telephone Encounter (Signed)
Patient called in to follow up on lab results from yesterday. Thank you!

## 2023-04-14 ENCOUNTER — Telehealth: Payer: Self-pay | Admitting: *Deleted

## 2023-04-14 NOTE — Progress Notes (Unsigned)
  Care Coordination  Outreach Note  04/14/2023 Name: VERBA DEGEN MRN: [id number] DOB: [birth year]   Care Coordination Outreach Attempts: An unsuccessful telephone outreach was attempted today to offer the patient information about available care coordination services.  Follow Up Plan:  Additional outreach attempts will be made to offer the patient care coordination information and services.   Encounter Outcome:  Patient Request to Call Back  Burman Nieves, [facility] Care Coordination Care Guide Direct Dial: [phone]

## 2023-04-15 NOTE — Progress Notes (Signed)
  Care Coordination   Note   04/15/2023 Name: Virginia Romero MRN: [id number] DOB: 09/13/1955  Virginia Romero is a 67 y.o. year old female who sees Excell Seltzer, MD for primary care. I reached out to Emilie Rutter by phone today to offer care coordination services.  [name] was given information about Care Coordination services today including:   The Care Coordination services include support from the care team which includes your Nurse Coordinator, Clinical Social Worker, or Pharmacist.  The Care Coordination team is here to help remove barriers to the health concerns and goals most important to you. Care Coordination services are voluntary, and the patient may decline or stop services at any time by request to their care team member.   Care Coordination Consent Status: Patient agreed to services and verbal consent obtained.   Follow up plan:  Telephone appointment with care coordination team member scheduled for:  04/21/2023  Encounter Outcome:  Patient Scheduled from referral   Burman Nieves, [facility] Care Coordination Care Guide Direct Dial: [phone]

## 2023-04-21 ENCOUNTER — Ambulatory Visit: Payer: Self-pay

## 2023-04-21 NOTE — Patient Instructions (Signed)
Visit Information  Thank you for taking time to visit with me today. Please don't hesitate to contact me if I can be of assistance to you.   Following are the goals we discussed today:  Patient will sign the transportation application and deliver to DSS-Mobility Department. Patient will re-apply for foodstamps at DSS.    Our next appointment is by telephone on 04/29/23 at 11:30am  Please call the care guide team at [phone] if you need to cancel or reschedule your appointment.   If you are experiencing a Mental Health or Behavioral Health Crisis or need someone to talk to, please call 911  Patient verbalizes understanding of instructions and care plan provided today and agrees to view in MyChart. Active MyChart status and patient understanding of how to access instructions and care plan via MyChart confirmed with patient.     Telephone follow up appointment with care management team member scheduled for: 04/29/23 at 11:30am  Lysle Morales, BSW Social Worker [phone]

## 2023-04-21 NOTE — Patient Outreach (Signed)
  Care Coordination   Initial Visit Note   04/21/2023 Name: Virginia Romero MRN: [id number] DOB: [birth year]  Virginia Romero is a 67 y.o. year old female who sees Excell Seltzer, MD for primary care. I spoke with  Virginia Romero by phone today.  What matters to the patients health and wellness today?  Patient wants transportation options for medical and errands.  Patient also plans to re-apply for foodstamps.    Goals Addressed             This Visit's Progress    Care coordination activities       Interventions Today    Flowsheet Row Most Recent Value  Chronic Disease   Chronic disease during today's visit Diabetes, Hypertension (HTN)  General Interventions   General Interventions Discussed/Reviewed General Interventions Discussed, General Interventions Reviewed, [employer], Communication with  [Pt reports no car and children are not local. Pt pays a cab and it is expensive. [employer] ended and only insurance is Medicare.$1500 [place]. Daughter has to take off work to transport to medical visit. Pt wants to reapply for Foodstamps.]  Communication with --  [SW t/c DSS Mobility Department and left vm to contact patient to verify service goes to [employer]. Pt is willing to take the signed application to DSS to submit. SW completed application and will mail to patient.]              SDOH assessments and interventions completed:  Yes  SDOH Interventions Today    Flowsheet Row Most Recent Value  SDOH Interventions   Food Insecurity Interventions Intervention Not Indicated  Housing Interventions Intervention Not Indicated  Transportation Interventions Intervention Not Indicated  Utilities Interventions Intervention Not Indicated        Care Coordination Interventions:  Yes, provided   Follow up plan: Follow up call scheduled for 04/29/23 at 11:30am.    Encounter Outcome:  Patient Visit Completed

## 2023-04-29 ENCOUNTER — Ambulatory Visit: Payer: Self-pay

## 2023-04-29 NOTE — Patient Outreach (Signed)
  Care Coordination   Follow Up Visit Note   04/29/2023 Name: Virginia Romero MRN: [id number] DOB: 02/24/1956  Virginia Romero is a 67 y.o. year old female who sees Excell Seltzer, MD for primary care. I spoke with  Virginia Romero by phone today.  What matters to the patients health and wellness today?  Patient had communicated with DSS and will enroll in transportation services.    Goals Addressed             This Visit's Progress    Care coordination activities       Interventions Today    Flowsheet Row Most Recent Value  General Interventions   General Interventions Discussed/Reviewed General Interventions Discussed, General Interventions Reviewed  [Pt spoke to DSS Transportation department and is waiting for paperwork in the mail.  DSS will be able to enroll for medical and errand transportation.  There is a small fee that pt feels she can afford. Pt does not want a follow up.]              SDOH assessments and interventions completed:  No     Care Coordination Interventions:  Yes, provided   Follow up plan: No further intervention required.   Encounter Outcome:  Patient Visit Completed

## 2023-04-29 NOTE — Patient Instructions (Signed)
Visit Information  Thank you for taking time to visit with me today. Please don't hesitate to contact me if I can be of assistance to you.   Following are the goals we discussed today:  Patient will complete and submit paperwork to DSS for Transportation.    If you are experiencing a Mental Health or Behavioral Health Crisis or need someone to talk to, please call 911  Patient verbalizes understanding of instructions and care plan provided today and agrees to view in MyChart. Active MyChart status and patient understanding of how to access instructions and care plan via MyChart confirmed with patient.     No further follow up required: Patient does not request a follow up visit.   Lysle Morales, BSW Social Worker [phone]

## 2023-04-29 NOTE — Patient Outreach (Signed)
  Care Coordination   04/29/2023 Name: Virginia Romero MRN: [id number] DOB: May 19, 1956   Care Coordination Outreach Attempts:  An unsuccessful telephone outreach was attempted for a scheduled appointment today.  Follow Up Plan:  Additional outreach attempts will be made to offer the patient care coordination information and services.   Encounter Outcome:  No Answer   Care Coordination Interventions:  No, not indicated    Lysle Morales, BSW Social Worker [phone]

## 2023-05-03 ENCOUNTER — Other Ambulatory Visit: Payer: Self-pay | Admitting: Family Medicine

## 2023-05-03 DIAGNOSIS — F32A Depression, unspecified: Secondary | ICD-10-CM

## 2023-05-03 DIAGNOSIS — F5104 Psychophysiologic insomnia: Secondary | ICD-10-CM

## 2023-05-05 NOTE — Telephone Encounter (Signed)
Last office visit 04/10/2023 CPE.  Last refilled 04/10/2023 for #90 with no refills.  Next Appt: No future appointments with PCP.

## 2023-05-12 ENCOUNTER — Ambulatory Visit (INDEPENDENT_AMBULATORY_CARE_PROVIDER_SITE_OTHER): Payer: Medicare Other | Admitting: Podiatry

## 2023-05-12 ENCOUNTER — Encounter: Payer: Self-pay | Admitting: Podiatry

## 2023-05-12 DIAGNOSIS — B351 Tinea unguium: Secondary | ICD-10-CM

## 2023-05-12 DIAGNOSIS — M79675 Pain in left toe(s): Secondary | ICD-10-CM | POA: Diagnosis not present

## 2023-05-12 DIAGNOSIS — E1151 Type 2 diabetes mellitus with diabetic peripheral angiopathy without gangrene: Secondary | ICD-10-CM

## 2023-05-12 DIAGNOSIS — M79674 Pain in right toe(s): Secondary | ICD-10-CM

## 2023-05-12 NOTE — Progress Notes (Signed)
       Subjective:  Patient ID: Virginia Romero, female    DOB: Apr 27, 1956,  MRN: [id number]  Virginia Romero presents to clinic today for:  Chief Complaint  Patient presents with   Diabetes    Foot exam - last A1c was 6.6, PCP referred, she is concerned her toenails look thick   New Patient (Initial Visit)  . Patient notes nails are thick and elongated, causing pain in shoe gear when ambulating.  She is not familiar with the diabetic shoe program, but is interested in learning more.  PCP is Excell Seltzer, MD.  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 female in NAD. AAO x 3.  Vascular Examination: Patient has palpable DP pulse, absent PT pulse bilateral.  Delayed capillary refill bilateral toes.  Sparse digital hair bilateral.  Proximal to distal cooling WNL bilateral.    Dermatological Examination: Interspaces are clear with no open lesions noted bilateral.  Skin is shiny and atrophic bilateral.  Nails are 3-50mm thick, with yellowish/brown discoloration, subungual debris and distal onycholysis x10.  There is pain with compression of nails x10.    Neurological Examination: Protective sensation intact b/l LE. Vibratory sensation intact b/l LE.  Musculoskeletal Examination: Muscle strength 5/5 to all LE muscle groups b/l.      Latest Ref Rng & Units 04/10/2023    11:24 AM 11/28/2022   11:05 AM  Hemoglobin A1C  Hemoglobin-A1c 4.6 - 6.5 % 6.6  6.2    Patient qualifies for at-risk foot care because of diabetes with PVD .  Assessment/Plan: 1. Pain due to onychomycosis of toenails of both feet   2. Type II diabetes mellitus with peripheral circulatory disorder (HCC)     FOR HOME USE ONLY DME DIABETIC SHOE  Mycotic nails x10 were sharply debrided with sterile nail nippers and power debriding burr to decrease bulk and length.  Will scheduled patient with our pedorthist for a diabetic shoe consult.    Return in about 3 months (around 08/10/2023) for [facility].   Clerance Lav, DPM, FACFAS [facility]     [address] [place], [place] [address]                Office [phone]  Fax [phone]

## 2023-05-15 ENCOUNTER — Other Ambulatory Visit: Payer: Medicare Other

## 2023-06-02 ENCOUNTER — Ambulatory Visit (AMBULATORY_SURGERY_CENTER): Payer: Medicare Other

## 2023-06-02 VITALS — Ht 61.75 in

## 2023-06-02 DIAGNOSIS — Z860100 Personal history of colon polyps, unspecified: Secondary | ICD-10-CM

## 2023-06-02 MED ORDER — PLENVU 140 G PO SOLR: 1.0000 | Freq: Once | ORAL | 0 refills | Status: AC

## 2023-06-02 NOTE — Progress Notes (Signed)
No egg or soy allergy known to patient  No issues known to pt with past sedation with any surgeries or procedures Patient denies ever being told they had issues or difficulty with intubation  No FH of Malignant Hyperthermia Pt is not on diet pills Pt is not on  home 02  Pt is not on blood thinners  Pt denies issues with constipation  No A fib or A flutter Have any cardiac testing pending-- no  LOA: independent  Prep: 2 day plenvu   Patient's chart reviewed by Cathlyn Parsons CNRA prior to previsit and patient appropriate for the [facility].  Previsit completed and red dot placed by patient's name on their procedure day (on provider's schedule).     PV competed with patient. Prep instructions sent to home address.

## 2023-06-05 ENCOUNTER — Telehealth: Payer: Self-pay | Admitting: Gastroenterology

## 2023-06-05 NOTE — Telephone Encounter (Signed)
 Inbound call from patient, states prep medication is too costly for her, patient would like to discuss more affordable options.

## 2023-06-06 ENCOUNTER — Other Ambulatory Visit: Payer: Self-pay | Admitting: Family Medicine

## 2023-06-06 DIAGNOSIS — F32A Depression, unspecified: Secondary | ICD-10-CM

## 2023-06-06 DIAGNOSIS — F5104 Psychophysiologic insomnia: Secondary | ICD-10-CM

## 2023-06-06 NOTE — Telephone Encounter (Signed)
 Spoke with patient about the prep costing too much as she is on disability.  Will leave prep kit at front desk on floor 2.Patient will come pick up prep Monday 06/09/23

## 2023-06-06 NOTE — Telephone Encounter (Signed)
 Last office visit 04/10/2023 for DM.  Last refilled 05/07/2023 for #90 with no refills.  Next Appt: No future appointments with PCP.

## 2023-06-09 ENCOUNTER — Other Ambulatory Visit: Payer: Self-pay | Admitting: Family Medicine

## 2023-06-09 DIAGNOSIS — I10 Essential (primary) hypertension: Secondary | ICD-10-CM

## 2023-06-12 ENCOUNTER — Other Ambulatory Visit: Payer: Self-pay | Admitting: Family Medicine

## 2023-06-12 DIAGNOSIS — E785 Hyperlipidemia, unspecified: Secondary | ICD-10-CM

## 2023-06-18 ENCOUNTER — Encounter: Payer: Self-pay | Admitting: Gastroenterology

## 2023-06-18 ENCOUNTER — Ambulatory Visit: Payer: Medicare Other | Admitting: Gastroenterology

## 2023-06-18 VITALS — BP 124/49 | HR 53 | Temp 97.3°F | Resp 22 | Ht 61.0 in | Wt 189.2 lb

## 2023-06-18 DIAGNOSIS — Z860100 Personal history of colon polyps, unspecified: Secondary | ICD-10-CM

## 2023-06-18 DIAGNOSIS — Z860101 Personal history of adenomatous and serrated colon polyps: Secondary | ICD-10-CM

## 2023-06-18 DIAGNOSIS — F419 Anxiety disorder, unspecified: Secondary | ICD-10-CM | POA: Diagnosis not present

## 2023-06-18 DIAGNOSIS — D122 Benign neoplasm of ascending colon: Secondary | ICD-10-CM | POA: Diagnosis not present

## 2023-06-18 DIAGNOSIS — K635 Polyp of colon: Secondary | ICD-10-CM | POA: Diagnosis not present

## 2023-06-18 DIAGNOSIS — Z1211 Encounter for screening for malignant neoplasm of colon: Secondary | ICD-10-CM

## 2023-06-18 DIAGNOSIS — K648 Other hemorrhoids: Secondary | ICD-10-CM

## 2023-06-18 DIAGNOSIS — I10 Essential (primary) hypertension: Secondary | ICD-10-CM | POA: Diagnosis not present

## 2023-06-18 DIAGNOSIS — K5730 Diverticulosis of large intestine without perforation or abscess without bleeding: Secondary | ICD-10-CM

## 2023-06-18 DIAGNOSIS — E119 Type 2 diabetes mellitus without complications: Secondary | ICD-10-CM | POA: Diagnosis not present

## 2023-06-18 DIAGNOSIS — F32A Depression, unspecified: Secondary | ICD-10-CM | POA: Diagnosis not present

## 2023-06-18 DIAGNOSIS — K621 Rectal polyp: Secondary | ICD-10-CM | POA: Diagnosis not present

## 2023-06-18 DIAGNOSIS — D128 Benign neoplasm of rectum: Secondary | ICD-10-CM | POA: Diagnosis not present

## 2023-06-18 MED ORDER — SODIUM CHLORIDE 0.9 % IV SOLN: 500.0000 mL | Freq: Once | INTRAVENOUS | Status: DC

## 2023-06-18 NOTE — Progress Notes (Signed)
 History and Physical:  This patient presents for endoscopic testing for: Encounter Diagnosis  Name Primary?   Hx of colonic polyps Yes    68 year old woman here for surveillance colonoscopy.  Last colonoscopy January 2023 for a positive Cologuard.  Fair preparation, 6 tubular adenomas removed, including an advanced adenoma. Patient denies chronic abdominal pain, rectal bleeding, constipation or diarrhea.   Patient is otherwise without complaints or active issues today.   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   Depression    Diabetes mellitus    Type II   Endometrial polyp    GERD (gastroesophageal reflux disease)    History of ovarian cyst    Hyperlipemia    Hypertension    Osteopenia 09/2016   T score -1.5   Positive colorectal cancer screening using Cologuard test 2022   PTSD (post-traumatic stress disorder)    Shortness of breath dyspnea    with anxiety   Thyroid  disease      Past Surgical History: Past Surgical History:  Procedure Laterality Date   CESAREAN SECTION  '79 AND '86   X2   COLONOSCOPY     with polypectomy   DIAGNOSTIC LAPAROSCOPY  07/17/2006   WITH LYSIS OF PERITUBULAR AND PERIOVARIAN ADHESIONS, LYSIS OF OMENTAL ADHESIONS, LSO, RIGHT  OVARIAN CYSTECTOMY , HYSTEROSCOPY  WITH EXCISION OF ENDOMETRIAL POLYP...   ORIF HUMERUS FRACTURE Left 03/01/2015   Procedure: OPEN REDUCTION INTERNAL FIXATION (ORIF) PROXIMAL HUMERUS FRACTURE;  Surgeon: Winston Hawking, MD;  Location: [facility] OR;  Service: Orthopedics;  Laterality: Left;   THYROIDECTOMY  2000   TUBAL LIGATION      Allergies: No Known Allergies  Outpatient Meds: Current Outpatient Medications  Medication Sig Dispense Refill   ALPRAZolam  (XANAX ) 1 MG tablet TAKE 1 TABLET BY MOUTH THREE TIMES DAILY AS NEEDED FOR ANXIETY 90 tablet 0    DULoxetine  (CYMBALTA ) 30 MG capsule Take 3 capsules (90 mg total) by mouth daily. 90 capsule 3   levothyroxine  (SYNTHROID ) 175 MCG tablet Take 1 tablet (175 mcg total) by mouth daily. 30 tablet 11   losartan  (COZAAR ) 100 MG tablet Take 1 tablet by mouth once daily 90 tablet 3   metFORMIN  (GLUCOPHAGE ) 500 MG tablet TAKE 2 TABLETS BY MOUTH TWICE DAILY WITH A MEAL 120 tablet 5   metoprolol  tartrate (LOPRESSOR ) 100 MG tablet Take 1 tablet (100 mg total) by mouth 2 (two) times daily. 180 tablet 3   rosuvastatin  (CRESTOR ) 20 MG tablet Take 1 tablet by mouth once daily 90 tablet 3   hydrochlorothiazide  (HYDRODIURIL ) 25 MG tablet Take 1 tablet by mouth once daily 90 tablet 1   Vitamin D , Ergocalciferol , (DRISDOL ) 1.25 MG (50000 UNIT) CAPS capsule Take 1 capsule (50,000 Units total) by mouth once a week. 12 capsule 0   Current Facility-Administered Medications  Medication Dose Route Frequency Provider Last Rate Last Admin   0.9 %  sodium chloride  infusion  500 mL Intravenous Once Danis, Delecia Vastine L III, MD          ___________________________________________________________________ Objective   Exam:  BP (!) 232/98   Pulse (!) 57   Temp (!) 97.3 F (36.3 C)   Ht 5\' 1"  (1.549 m)   Wt 189 lb 3.2 oz (85.8 kg)   SpO2 97%   BMI  35.75 kg/m   CV: regular , S1/S2 Resp: clear to auscultation bilaterally, normal RR and effort noted GI: soft, no tenderness, with active bowel sounds.   Assessment: Encounter Diagnosis  Name Primary?   Hx of colonic polyps Yes     Plan: Colonoscopy   The benefits and risks of the planned procedure were described in detail with the patient or (when appropriate) their health care proxy.  Risks were outlined as including, but not limited to, bleeding, infection, perforation, adverse medication reaction leading to cardiac or pulmonary decompensation, pancreatitis (if ERCP).  The limitation of incomplete mucosal visualization was also discussed.  No guarantees or  warranties were given.  The patient is appropriate for an endoscopic procedure in the ambulatory setting.   - Lorella Roles, MD

## 2023-06-18 NOTE — Progress Notes (Signed)
 Notified CRNA, Rogena Class and Dr Dominic Friendly of patient BP of 232/98, ok to proceed with procedure.

## 2023-06-18 NOTE — Progress Notes (Signed)
 Pt's states no medical or surgical changes since previsit or office visit.

## 2023-06-18 NOTE — Progress Notes (Signed)
 A/ox3, pleased with MAC, report to RN

## 2023-06-18 NOTE — Progress Notes (Signed)
 Called to room to assist during endoscopic procedure.  Patient ID and intended procedure confirmed with present staff. Received instructions for my participation in the procedure from the performing physician.

## 2023-06-18 NOTE — Patient Instructions (Signed)

## 2023-06-18 NOTE — Op Note (Signed)
 [facility] Patient Name: Virginia Romero Procedure Date: 06/18/2023 10:48 AM MRN: [id number] Endoscopist: Ace Abu L. Dominic Friendly , MD, [id number] Age: 68 Referring MD:  Date of Birth: December 15, 1955 Gender: Female Account #: [id number] Procedure:                Colonoscopy Indications:              Surveillance: History of adenomatous polyps,                            inadequate prep on last exam (<59yr)                           6 TAs (including one > 10mm) Jan 2023 (done for                            positive cologuard) - fair prep on that exam with                            golytely  Medicines:                Monitored Anesthesia Care Procedure:                Pre-Anesthesia Assessment:                           - Prior to the procedure, a History and Physical                            was performed, and patient medications and                            allergies were reviewed. The patient's tolerance of                            previous anesthesia was also reviewed. The risks                            and benefits of the procedure and the sedation                            options and risks were discussed with the patient.                            All questions were answered, and informed consent                            was obtained. Prior Anticoagulants: The patient has                            taken no anticoagulant or antiplatelet agents. ASA                            Grade Assessment: III - A patient with severe  systemic disease. After reviewing the risks and                            benefits, the patient was deemed in satisfactory                            condition to undergo the procedure.                           After obtaining informed consent, the colonoscope                            was passed under direct vision. Throughout the                            procedure, the patient's blood pressure, pulse, and                             oxygen saturations were monitored continuously. The                            CF [id number] #[id number] was introduced through the anus                            and advanced to the the cecum, identified by                            appendiceal orifice and ileocecal valve. The                            colonoscopy was somewhat difficult due to a                            redundant colon. Successful completion of the                            procedure was aided by straightening and shortening                            the scope to obtain bowel loop reduction. The                            patient tolerated the procedure well. The quality                            of the bowel preparation was excellent. The                            ileocecal valve, appendiceal orifice, and rectum                            were photographed. The bowel preparation used was 2  day Suprep followed by golytely . Scope In: 10:59:48 AM Scope Out: 11:22:52 AM Scope Withdrawal Time: 0 hours 18 minutes 26 seconds  Total Procedure Duration: 0 hours 23 minutes 4 seconds  Findings:                 The perianal and digital rectal examinations were                            normal.                           Repeat examination of right colon under NBI                            performed.                           Three sessile polyps were found in the ascending                            colon. The polyps were diminutive in size. These                            polyps were removed with a cold snare. Resection                            and retrieval were complete. (Jar 1)                           Multiple diverticula were found in the left colon                            and right colon.                           An 8 mm polyp was found in the recto-sigmoid colon.                            The polyp was semi-pedunculated. The polyp was                            removed with a  hot snare. Resection and retrieval                            were complete. (Jar 2)                           Internal hemorrhoids were found.                           The exam was otherwise without abnormality on                            direct and retroflexion views. Complications:            No immediate complications. Estimated Blood Loss:  Estimated blood loss was minimal. Impression:               - Three diminutive polyps in the ascending colon,                            removed with a cold snare. Resected and retrieved.                           - Diverticulosis in the left colon and in the right                            colon.                           - One 8 mm polyp at the recto-sigmoid colon,                            removed with a cold snare. Resected and retrieved.                           - Internal hemorrhoids.                           - The examination was otherwise normal on direct                            and retroflexion views. Recommendation:           - Patient has a contact number available for                            emergencies. The signs and symptoms of potential                            delayed complications were discussed with the                            patient. Return to normal activities tomorrow.                            Written discharge instructions were provided to the                            patient.                           - Resume previous diet.                           - Continue present medications.                           - Await pathology results.                           - Repeat colonoscopy is recommended for  surveillance. The colonoscopy date will be                            determined after pathology results from today's                            exam become available for review. Hanya Guerin L. Dominic Friendly, MD 06/18/2023 11:29:11 AM This report has been signed electronically.

## 2023-06-19 ENCOUNTER — Telehealth: Payer: Self-pay

## 2023-06-19 NOTE — Telephone Encounter (Signed)
Attempted f/u call. No answer, left VM. 

## 2023-06-23 LAB — SURGICAL PATHOLOGY

## 2023-06-24 ENCOUNTER — Encounter: Payer: Self-pay | Admitting: Gastroenterology

## 2023-07-09 ENCOUNTER — Other Ambulatory Visit: Payer: Self-pay | Admitting: Family Medicine

## 2023-07-09 NOTE — Telephone Encounter (Signed)
 Last office visit 04/10/23 for CPE.  Last refilled 01/28/2023 for #12 with no refills.  Vit D level 04/10/23 which was normal at 36.93.  Next Appt: No future appointments.

## 2023-07-16 ENCOUNTER — Other Ambulatory Visit: Payer: Self-pay | Admitting: Family Medicine

## 2023-07-16 DIAGNOSIS — F5104 Psychophysiologic insomnia: Secondary | ICD-10-CM

## 2023-07-16 DIAGNOSIS — F419 Anxiety disorder, unspecified: Secondary | ICD-10-CM

## 2023-07-16 NOTE — Telephone Encounter (Signed)
Last office visit 04/10/23 for CPE.  Last refilled 06/06/23 for #90 with no refills.  Next Appt: No future appointments with PCP.

## 2023-07-31 ENCOUNTER — Telehealth: Payer: Self-pay

## 2023-07-31 NOTE — Telephone Encounter (Signed)
 Copied from CRM [id number]. Topic: Clinical - Medical Advice >> Jul 31, 2023 12:25 PM Deaijah H wrote: Reason for CRM: Patient called in stating she saw on news that stated anyone who received measles shot in 1957 needs to receive another shot, she would like someone to call & confirm if this is true. Please call [phone]   Medicare will not cover in office patient will need to get at pharmacy and file part D

## 2023-07-31 NOTE — Telephone Encounter (Signed)
 Ms. Leitzke notified as instructed by telephone.  Patient states understanding.  No other concerns.  Patient she saw this today on [employer].

## 2023-07-31 NOTE — Telephone Encounter (Signed)
 I do not understand, she was born in 02/01/1956 how could she have received the measles shot in 1956?  Birth before 35 - Individuals born before 20 are generally considered immune to measles and mumps, except health care workers.  After 1956 she should be vaccinated like usual.  I am not aware of anything otherwise.  Please obtain details about what patient concerns are and I can look into further as I am not aware of this.

## 2023-08-11 ENCOUNTER — Ambulatory Visit: Payer: Medicare Other | Admitting: Podiatry

## 2023-08-12 ENCOUNTER — Other Ambulatory Visit: Payer: Self-pay | Admitting: Family Medicine

## 2023-08-12 DIAGNOSIS — F32A Depression, unspecified: Secondary | ICD-10-CM

## 2023-08-12 DIAGNOSIS — F5104 Psychophysiologic insomnia: Secondary | ICD-10-CM

## 2023-08-12 NOTE — Telephone Encounter (Signed)
 Last office visit 04/10/2023 for DM.  Last refilled 07/16/2023 for #90 with no refills.  Next Appt: CPE 04/13/24.

## 2023-08-19 ENCOUNTER — Telehealth: Payer: Self-pay | Admitting: Family Medicine

## 2023-08-19 DIAGNOSIS — E1169 Type 2 diabetes mellitus with other specified complication: Secondary | ICD-10-CM

## 2023-08-19 NOTE — Telephone Encounter (Signed)
 I called the patient to discuss the  below. Questions answered. It appears she is due for a diabetes follow-up sometime in May. She is agreeable to returning at that time for diabetes follow-up with labs prior.  Labs ordered. Virginia Romero, please call the patient to set up a MAY diabetes follow-up with labs prior (A1c, complete metabolic panel thyroid labs and urine microalbumin)   Virginia Romero   We were given a list of the individuals that may have been affected by the discrepancy in the urine micro-albumin, and your results may have been affected.  You are already on a kidney protective medication called losartan, but there may be additional medications that are recommended to protect your kidneys such as Jardiance.  The first step is to determine if your results were affected.  I recommend you call the office to set up a lab only appointment to repeat the urine micro-albumin test (at no charge) to determine if we need any additional treatment or to just continue monitoring moving forward. All you have to do prior to coming in is drink some water as you will need to provide a urine sample. Ideally we recommend not doing any strenuous exercise two days or so prior to taking the test as it can affect the results.  If you have any further questions/concerns don't be afraid to ask!   Kerby Nora, MD [facility]  -------------- FAQs  What happened? We recently discovered a software error in a system at the laboratory at [facility] that examines kidney function. It is often used in the care of diabetes and high blood pressure. The test is called the uACR test. It compares the amount of a protein in urine with the amount of a waste product in urine. The laboratory software miscalculated the ratio of one to the other.  The measurements themselves were correct.  Please remember that keeping your blood sugar and blood pressure levels in the ranges discussed with your provider are  important to healthy kidneys.  What did the test measure? The uACR test compares the amount of a protein in urine with the amount of a waste product in urine. The software miscalculated it. This test is one of several factors used to judge kidney function.  Has the issue been resolved? As soon as this miscalculation was discovered, the software was fixed.  Will I need to take the test again? Perhaps. We expect the calculation error won't impact most patients. If it impacts you, your provider will contact you. They may ask for the urine test to be redone. You won't be charged for that additional test if it is needed.  Is this the only test that is used to check my kidneys? No, this is one of several tests your provider might use to watch for kidney damage related to diabetes or high blood pressure.  Tests are part of the entire package considered by your provider to give good care for diabetes and high blood pressure.  What does this mean for my treatment? This depends on the individual. Your provider can tell you more. Again, we expect this won't impact most people.  Who do I contact with questions? Contact your provider. Am I ok? We expect the calculation error won't impact most patients. If it impacts you, your provider will contact you. They may ask for the urine test to be redone. You won't be charged for that additional test if it is needed. What should I do? We have recalculated your  results, and they are available to your provider now.  We are in the process of making the updated results available in MyChart as well.  Your provider will contact you if they believe that you need to adjust your treatment plan due to the updated results.  Was I harmed by my incorrect test results? We believe the calculation error won't impact most patients. If it impacts you, your provider will contact you. They may ask for the urine test to be redone. You won't be charged for that additional test if it is  needed.

## 2023-08-20 NOTE — Telephone Encounter (Signed)
 LVM for patient to c/b and schedule.

## 2023-08-20 NOTE — Telephone Encounter (Signed)
 Patient has been scheduled

## 2023-08-20 NOTE — Telephone Encounter (Signed)
 Please call the patient to set up a MAY diabetes follow-up with labs prior (A1c, complete metabolic panel thyroid labs and urine microalbumin)

## 2023-08-25 ENCOUNTER — Ambulatory Visit: Admitting: Podiatry

## 2023-09-10 ENCOUNTER — Other Ambulatory Visit: Payer: Self-pay | Admitting: Family Medicine

## 2023-09-10 DIAGNOSIS — F32A Depression, unspecified: Secondary | ICD-10-CM

## 2023-09-10 DIAGNOSIS — F5104 Psychophysiologic insomnia: Secondary | ICD-10-CM

## 2023-09-10 NOTE — Telephone Encounter (Signed)
 Last office visit 04/10/2023 for DM.  Last refilled 08/12/2023 for #90 with no refills.  Next Appt: 10/16/23 for DM.

## 2023-10-07 ENCOUNTER — Other Ambulatory Visit

## 2023-10-08 ENCOUNTER — Other Ambulatory Visit: Payer: Self-pay | Admitting: Family Medicine

## 2023-10-08 DIAGNOSIS — F419 Anxiety disorder, unspecified: Secondary | ICD-10-CM

## 2023-10-08 DIAGNOSIS — F5104 Psychophysiologic insomnia: Secondary | ICD-10-CM

## 2023-10-08 NOTE — Telephone Encounter (Signed)
 Last office visit 04/10/2023 for CPE.  Last refilled 09/11/23 for #90 with no refills. Next Appt: 10/23/23 for DM.

## 2023-10-09 ENCOUNTER — Other Ambulatory Visit

## 2023-10-16 ENCOUNTER — Ambulatory Visit: Admitting: Family Medicine

## 2023-10-16 ENCOUNTER — Other Ambulatory Visit

## 2023-10-23 ENCOUNTER — Ambulatory Visit: Admitting: Family Medicine

## 2023-11-07 ENCOUNTER — Other Ambulatory Visit: Payer: Self-pay | Admitting: Family Medicine

## 2023-11-07 DIAGNOSIS — F419 Anxiety disorder, unspecified: Secondary | ICD-10-CM

## 2023-11-07 DIAGNOSIS — F5104 Psychophysiologic insomnia: Secondary | ICD-10-CM

## 2023-11-07 NOTE — Telephone Encounter (Signed)
 Last office visit 04/10/2023 for DM.  Last refilled 10/08/2023 for #90 with no refills.  Next appt: 11/27/2023 for DM.

## 2023-11-20 ENCOUNTER — Ambulatory Visit: Payer: Self-pay | Admitting: Family Medicine

## 2023-11-20 ENCOUNTER — Other Ambulatory Visit

## 2023-11-20 DIAGNOSIS — E785 Hyperlipidemia, unspecified: Secondary | ICD-10-CM | POA: Diagnosis not present

## 2023-11-20 DIAGNOSIS — E034 Atrophy of thyroid (acquired): Secondary | ICD-10-CM | POA: Diagnosis not present

## 2023-11-20 DIAGNOSIS — E1169 Type 2 diabetes mellitus with other specified complication: Secondary | ICD-10-CM | POA: Diagnosis not present

## 2023-11-20 LAB — MICROALBUMIN / CREATININE URINE RATIO
Creatinine,U: 244.8 mg/dL
Microalb Creat Ratio: 48.5 mg/g — ABNORMAL HIGH (ref 0.0–30.0)
Microalb, Ur: 11.9 mg/dL — ABNORMAL HIGH (ref 0.0–1.9)

## 2023-11-20 LAB — T4, FREE: Free T4: 1.17 ng/dL (ref 0.60–1.60)

## 2023-11-20 LAB — T3, FREE: T3, Free: 2.9 pg/mL (ref 2.3–4.2)

## 2023-11-20 LAB — TSH: TSH: 4.1 u[IU]/mL (ref 0.35–5.50)

## 2023-11-20 NOTE — Progress Notes (Signed)
 No critical labs need to be addressed urgently. We will discuss labs in detail at upcoming office visit.

## 2023-11-27 ENCOUNTER — Ambulatory Visit: Admitting: Family Medicine

## 2023-11-27 ENCOUNTER — Encounter: Payer: Self-pay | Admitting: Family Medicine

## 2023-11-27 VITALS — BP 170/78 | HR 60 | Temp 98.2°F | Resp 20 | Ht 61.75 in | Wt 192.0 lb

## 2023-11-27 DIAGNOSIS — E1169 Type 2 diabetes mellitus with other specified complication: Secondary | ICD-10-CM | POA: Diagnosis not present

## 2023-11-27 DIAGNOSIS — F331 Major depressive disorder, recurrent, moderate: Secondary | ICD-10-CM

## 2023-11-27 DIAGNOSIS — R809 Proteinuria, unspecified: Secondary | ICD-10-CM | POA: Diagnosis not present

## 2023-11-27 DIAGNOSIS — I152 Hypertension secondary to endocrine disorders: Secondary | ICD-10-CM

## 2023-11-27 DIAGNOSIS — Z599 Problem related to housing and economic circumstances, unspecified: Secondary | ICD-10-CM | POA: Diagnosis not present

## 2023-11-27 DIAGNOSIS — Z7984 Long term (current) use of oral hypoglycemic drugs: Secondary | ICD-10-CM | POA: Diagnosis not present

## 2023-11-27 DIAGNOSIS — E1159 Type 2 diabetes mellitus with other circulatory complications: Secondary | ICD-10-CM

## 2023-11-27 DIAGNOSIS — E785 Hyperlipidemia, unspecified: Secondary | ICD-10-CM | POA: Diagnosis not present

## 2023-11-27 LAB — POCT GLYCOSYLATED HEMOGLOBIN (HGB A1C): Hemoglobin A1C: 6.3 % — AB (ref 4.0–5.6)

## 2023-11-27 NOTE — Patient Instructions (Signed)
Set up yearly eye exam for diabetes and have the opthalmologist send [place] a copy of the evaluation for the chart.

## 2023-11-27 NOTE — Assessment & Plan Note (Signed)
 Chronic, poor control.  She has stopped Cymbalta .

## 2023-11-27 NOTE — Assessment & Plan Note (Signed)
 Chronic, well controlled on metformin . Recent new elevated microalbumin 8 ratio.  Discussed once she is more financially secure that we will conside SGLT2 inhibitor.  She is already on ARB.

## 2023-11-27 NOTE — Assessment & Plan Note (Addendum)
 Spent significant time at office visit discussing her financial issues currently.  She has $6 to last the next 2 weeks.  She is unable to afford her medications.  We gave her information on [facility] outpatient medication assistance.  Blood pressure medication and thyroid  medication are on $5 list.  Given coupon for $10 off at [facility] outpatient pharmacy. She also plans to speak with [facility] which is her regular pharmacy to determine if cost of her medications will go above the amount she can afford.  She is also requested assistance from her daughter.  I encouraged her to not go without her thyroid  and blood pressure medications.  She can hold the cholesterol medication and antidepressant if needed.  She refused referral to care management for additional assistance.  Information on find help.com was given.  She states she will be fine when she gets her Social Security check in July.

## 2023-11-27 NOTE — Assessment & Plan Note (Signed)
 On ARB.SABRA consider SGLT2i at 3 months follow up.

## 2023-11-27 NOTE — Progress Notes (Signed)
 Patient ID: Virginia Romero, female    DOB: 06-Aug-1955, 68 y.o.   MRN: [id number]  This visit was conducted in person.  BP (!) 170/78   Pulse 60   Temp 98.2 F (36.8 C)   Resp 20   Ht 5' 1.75 (1.568 m)   Wt 192 lb (87.1 kg)   SpO2 98%   BMI 35.40 kg/m    CC:  Chief Complaint  Patient presents with   Diabetes    Subjective:   HPI: Virginia Romero is a 68 y.o. female presenting on 11/27/2023 for   DM follow up.   She has had several losses of friends and family meds.  She is low on money.  She has been having trouble with mood.  Not always taking her medication sometimes cannot afford them.  Has  had to stop cymbalta  until July 9 when her social security is deposited. She has 6 dollars to last until July 9  Taking a cab everywhere as cannot fix car.  Hypertension:   HAs not been taking any BP meds. On losartan  100 mg daily, metoprolol  100 mg twice daily, HCTZ 25 mg daily BP Readings from Last 3 Encounters:  11/27/23 (!) 170/78  06/18/23 (!) 124/49  04/10/23 130/76  Using medication without problems or lightheadedness:  none Chest pain with exertion:none Edema: yes... and chronic skin changes Short of breath: Average home BPs: Other issues:  Diabetes:  well controlled on metformin  500 mg 2 tablets twice daily Lab Results  Component Value Date   HGBA1C 6.3 (A) 11/27/2023  Using medications without difficulties: Hypoglycemic episodes: Hyperglycemic episodes: Feet problems: Blood Sugars averaging: eye exam within last year:  Elevated Cholesterol: In past well-controlled on Crestor  20 mg daily Lab Results  Component Value Date   CHOL 127 04/10/2023   HDL 38.10 (L) 04/10/2023   LDLCALC 54 04/10/2023   LDLDIRECT 67.0 03/06/2021   TRIG 178.0 (H) 04/10/2023   CHOLHDL 3 04/10/2023  Using medications without problems: Muscle aches:  Diet compliance: moderate Exercise: daily walking. Other complaints:  Wt Readings from Last 3 Encounters:  11/27/23 192 lb  (87.1 kg)  06/18/23 189 lb 3.2 oz (85.8 kg)  04/10/23 194 lb (88 kg)    Hypothyroid well controlled...  now on 175 mcg daily only has 3 tablets left. Lab Results  Component Value Date   TSH 4.10 11/20/2023      Relevant past medical, surgical, family and social history reviewed and updated as indicated. Interim medical history since our last visit reviewed. Allergies and medications reviewed and updated. Outpatient Medications Prior to Visit  Medication Sig Dispense Refill   ALPRAZolam  (XANAX ) 1 MG tablet TAKE 1 TABLET BY MOUTH THREE TIMES DAILY AS NEEDED FOR ANXIETY 90 tablet 0   DULoxetine  (CYMBALTA ) 30 MG capsule Take 3 capsules (90 mg total) by mouth daily. 90 capsule 3   hydrochlorothiazide  (HYDRODIURIL ) 25 MG tablet Take 1 tablet by mouth once daily 90 tablet 1   levothyroxine  (SYNTHROID ) 175 MCG tablet Take 1 tablet (175 mcg total) by mouth daily. 30 tablet 11   losartan  (COZAAR ) 100 MG tablet Take 1 tablet by mouth once daily 90 tablet 3   metFORMIN  (GLUCOPHAGE ) 500 MG tablet TAKE 2 TABLETS BY MOUTH TWICE DAILY WITH A MEAL 120 tablet 5   metoprolol  tartrate (LOPRESSOR ) 100 MG tablet Take 1 tablet (100 mg total) by mouth 2 (two) times daily. 180 tablet 3   rosuvastatin  (CRESTOR ) 20 MG tablet Take 1 tablet by  mouth once daily 90 tablet 3   Vitamin D , Ergocalciferol , (DRISDOL ) 1.25 MG (50000 UNIT) CAPS capsule Take 1 capsule by mouth once a week 12 capsule 0   No facility-administered medications prior to visit.     Per HPI unless specifically indicated in ROS section below Review of Systems  Constitutional:  Negative for fatigue and fever.  HENT:  Negative for congestion.   Eyes:  Negative for pain.  Respiratory:  Negative for cough and shortness of breath.   Cardiovascular:  Negative for chest pain, palpitations and leg swelling.  Gastrointestinal:  Negative for abdominal pain.  Genitourinary:  Negative for dysuria and vaginal bleeding.  Musculoskeletal:  Negative for  back pain.  Skin:  Positive for rash.  Neurological:  Negative for syncope, light-headedness and headaches.  Psychiatric/Behavioral:  Negative for dysphoric mood.    Objective:  BP (!) 170/78   Pulse 60   Temp 98.2 F (36.8 C)   Resp 20   Ht 5' 1.75 (1.568 m)   Wt 192 lb (87.1 kg)   SpO2 98%   BMI 35.40 kg/m   Wt Readings from Last 3 Encounters:  11/27/23 192 lb (87.1 kg)  06/18/23 189 lb 3.2 oz (85.8 kg)  04/10/23 194 lb (88 kg)      Physical Exam Vitals and nursing note reviewed.  Constitutional:      General: She is not in acute distress.    Appearance: Normal appearance. She is well-developed. She is not ill-appearing or toxic-appearing.  HENT:     Head: Normocephalic.     Right Ear: Hearing, tympanic membrane, ear canal and external ear normal.     Left Ear: Hearing, tympanic membrane, ear canal and external ear normal.     Nose: Nose normal.   Eyes:     General: Lids are normal. Lids are everted, no foreign bodies appreciated.     Conjunctiva/sclera: Conjunctivae normal.     Pupils: Pupils are equal, round, and reactive to light.   Neck:     Thyroid : No thyroid  mass or thyromegaly.     Vascular: No carotid bruit.     Trachea: Trachea normal.   Cardiovascular:     Rate and Rhythm: Normal rate and regular rhythm.     Heart sounds: Normal heart sounds, S1 normal and S2 normal. No murmur heard.    No gallop.  Pulmonary:     Effort: Pulmonary effort is normal. No respiratory distress.     Breath sounds: Normal breath sounds. No wheezing, rhonchi or rales.  Abdominal:     General: Bowel sounds are normal. There is no distension or abdominal bruit.     Palpations: Abdomen is soft. There is no fluid wave or mass.     Tenderness: There is no abdominal tenderness. There is no guarding or rebound.     Hernia: No hernia is present.   Musculoskeletal:     Cervical back: Normal range of motion and neck supple.  Lymphadenopathy:     Cervical: No cervical adenopathy.    Skin:    General: Skin is warm and dry.     Findings: No rash.   Neurological:     Mental Status: She is alert.     Cranial Nerves: No cranial nerve deficit.     Sensory: No sensory deficit.   Psychiatric:        Mood and Affect: Mood is not anxious or depressed.        Speech: Speech normal.  Behavior: Behavior normal. Behavior is cooperative.        Judgment: Judgment normal.    Results for orders placed or performed in visit on 11/27/23  HgB A1c   Collection Time: 11/27/23 10:54 AM  Result Value Ref Range   Hemoglobin A1C 6.3 (A) 4.0 - 5.6 %   HbA1c POC (<> result, manual entry)     HbA1c, POC (prediabetic range)     HbA1c, POC (controlled diabetic range)       COVID 19 screen:  No recent travel or known exposure to COVID19 The patient denies respiratory symptoms of COVID 19 at this time. The importance of social distancing was discussed today.   Assessment and Plan  Due for yearly eye exam.Pt reminded.  Problem List Items Addressed This Visit     DM type 2 with diabetic dyslipidemia (HCC) - Primary    Chronic, well controlled on metformin . Recent new elevated microalbumin 8 ratio.  Discussed once she is more financially secure that we will conside SGLT2 inhibitor.  She is already on ARB.       Relevant Orders   HgB A1c (Completed)   Financial difficulties   Spent significant time at office visit discussing her financial issues currently.  She has $6 to last the next 2 weeks.  She is unable to afford her medications.  We gave her information on [facility] outpatient medication assistance.  Blood pressure medication and thyroid  medication are on $5 list.  Given coupon for $10 off at [facility] outpatient pharmacy. She also plans to speak with [facility] which is her regular pharmacy to determine if cost of her medications will go above the amount she can afford.  She is also requested assistance from her daughter.  I encouraged her to not go without her thyroid  and blood  pressure medications.  She can hold the cholesterol medication and antidepressant if needed.  She refused referral to care management for additional assistance.  Information on find [email or web address] was given.  She states she will be fine when she gets her Social Security check in July.      Hypertension associated with diabetes (HCC)   Chronic, poor controlled given not taking medication. Encouraged patient to take medication regularly.  See note on financial need.      MDD (major depressive disorder), recurrent episode, moderate (HCC)   Chronic, poor control.  She has stopped Cymbalta .          Greig Ring, MD

## 2023-11-27 NOTE — Assessment & Plan Note (Signed)
 Chronic, poor controlled given not taking medication. Encouraged patient to take medication regularly.  See note on financial need.

## 2023-11-28 DIAGNOSIS — Z1231 Encounter for screening mammogram for malignant neoplasm of breast: Secondary | ICD-10-CM | POA: Diagnosis not present

## 2023-11-28 LAB — HM MAMMOGRAPHY

## 2023-12-01 ENCOUNTER — Encounter: Payer: Self-pay | Admitting: Family Medicine

## 2023-12-02 ENCOUNTER — Ambulatory Visit: Payer: Self-pay | Admitting: Family Medicine

## 2023-12-02 ENCOUNTER — Other Ambulatory Visit: Payer: Self-pay | Admitting: Family Medicine

## 2023-12-05 ENCOUNTER — Other Ambulatory Visit: Payer: Self-pay | Admitting: Family Medicine

## 2023-12-05 DIAGNOSIS — F419 Anxiety disorder, unspecified: Secondary | ICD-10-CM

## 2023-12-05 DIAGNOSIS — F5104 Psychophysiologic insomnia: Secondary | ICD-10-CM

## 2023-12-08 NOTE — Telephone Encounter (Signed)
 Last office visit 11/27/2023 for DM.  Last refilled 11/07/2023 for #90 with no refills.  Next appt: 02/27/2024 for BP check.

## 2023-12-26 ENCOUNTER — Encounter (HOSPITAL_COMMUNITY): Payer: Self-pay

## 2023-12-26 ENCOUNTER — Emergency Department (HOSPITAL_COMMUNITY)

## 2023-12-26 ENCOUNTER — Inpatient Hospital Stay (HOSPITAL_COMMUNITY)

## 2023-12-26 ENCOUNTER — Other Ambulatory Visit: Payer: Self-pay

## 2023-12-26 ENCOUNTER — Inpatient Hospital Stay (HOSPITAL_COMMUNITY)
Admission: EM | Admit: 2023-12-26 | Discharge: 2023-12-29 | DRG: 305 | Disposition: A | Discharge status: Home Health | Attending: Family Medicine | Admitting: Family Medicine

## 2023-12-26 DIAGNOSIS — E119 Type 2 diabetes mellitus without complications: Secondary | ICD-10-CM | POA: Diagnosis not present

## 2023-12-26 DIAGNOSIS — Z79899 Other long term (current) drug therapy: Secondary | ICD-10-CM | POA: Diagnosis not present

## 2023-12-26 DIAGNOSIS — F39 Unspecified mood [affective] disorder: Secondary | ICD-10-CM | POA: Diagnosis present

## 2023-12-26 DIAGNOSIS — R519 Headache, unspecified: Secondary | ICD-10-CM | POA: Diagnosis not present

## 2023-12-26 DIAGNOSIS — F4310 Post-traumatic stress disorder, unspecified: Secondary | ICD-10-CM | POA: Diagnosis not present

## 2023-12-26 DIAGNOSIS — I161 Hypertensive emergency: Secondary | ICD-10-CM | POA: Diagnosis not present

## 2023-12-26 DIAGNOSIS — I714 Abdominal aortic aneurysm, without rupture, unspecified: Secondary | ICD-10-CM | POA: Diagnosis present

## 2023-12-26 DIAGNOSIS — Z5986 Financial insecurity: Secondary | ICD-10-CM

## 2023-12-26 DIAGNOSIS — E279 Disorder of adrenal gland, unspecified: Secondary | ICD-10-CM | POA: Diagnosis not present

## 2023-12-26 DIAGNOSIS — D3500 Benign neoplasm of unspecified adrenal gland: Secondary | ICD-10-CM | POA: Diagnosis present

## 2023-12-26 DIAGNOSIS — K219 Gastro-esophageal reflux disease without esophagitis: Secondary | ICD-10-CM | POA: Diagnosis present

## 2023-12-26 DIAGNOSIS — R079 Chest pain, unspecified: Secondary | ICD-10-CM | POA: Diagnosis not present

## 2023-12-26 DIAGNOSIS — F32A Depression, unspecified: Secondary | ICD-10-CM | POA: Diagnosis present

## 2023-12-26 DIAGNOSIS — Z7984 Long term (current) use of oral hypoglycemic drugs: Secondary | ICD-10-CM | POA: Diagnosis not present

## 2023-12-26 DIAGNOSIS — R935 Abnormal findings on diagnostic imaging of other abdominal regions, including retroperitoneum: Secondary | ICD-10-CM | POA: Diagnosis not present

## 2023-12-26 DIAGNOSIS — Z716 Tobacco abuse counseling: Secondary | ICD-10-CM | POA: Diagnosis not present

## 2023-12-26 DIAGNOSIS — Z8249 Family history of ischemic heart disease and other diseases of the circulatory system: Secondary | ICD-10-CM | POA: Diagnosis not present

## 2023-12-26 DIAGNOSIS — Z79890 Hormone replacement therapy (postmenopausal): Secondary | ICD-10-CM | POA: Diagnosis not present

## 2023-12-26 DIAGNOSIS — E039 Hypothyroidism, unspecified: Secondary | ICD-10-CM | POA: Diagnosis not present

## 2023-12-26 DIAGNOSIS — Q272 Other congenital malformations of renal artery: Secondary | ICD-10-CM | POA: Diagnosis not present

## 2023-12-26 DIAGNOSIS — Z6836 Body mass index (BMI) 36.0-36.9, adult: Secondary | ICD-10-CM | POA: Diagnosis not present

## 2023-12-26 DIAGNOSIS — F17210 Nicotine dependence, cigarettes, uncomplicated: Secondary | ICD-10-CM | POA: Diagnosis not present

## 2023-12-26 DIAGNOSIS — I160 Hypertensive urgency: Principal | ICD-10-CM

## 2023-12-26 DIAGNOSIS — Z91148 Patient's other noncompliance with medication regimen for other reason: Secondary | ICD-10-CM

## 2023-12-26 DIAGNOSIS — F419 Anxiety disorder, unspecified: Secondary | ICD-10-CM | POA: Diagnosis present

## 2023-12-26 DIAGNOSIS — D3502 Benign neoplasm of left adrenal gland: Secondary | ICD-10-CM | POA: Diagnosis not present

## 2023-12-26 DIAGNOSIS — Z833 Family history of diabetes mellitus: Secondary | ICD-10-CM

## 2023-12-26 DIAGNOSIS — E66811 Obesity, class 1: Secondary | ICD-10-CM | POA: Diagnosis present

## 2023-12-26 DIAGNOSIS — E1169 Type 2 diabetes mellitus with other specified complication: Secondary | ICD-10-CM

## 2023-12-26 DIAGNOSIS — E785 Hyperlipidemia, unspecified: Secondary | ICD-10-CM | POA: Diagnosis present

## 2023-12-26 DIAGNOSIS — K449 Diaphragmatic hernia without obstruction or gangrene: Secondary | ICD-10-CM | POA: Diagnosis not present

## 2023-12-26 DIAGNOSIS — I6523 Occlusion and stenosis of bilateral carotid arteries: Secondary | ICD-10-CM | POA: Diagnosis not present

## 2023-12-26 DIAGNOSIS — I700 Atherosclerosis of aorta: Secondary | ICD-10-CM | POA: Diagnosis present

## 2023-12-26 DIAGNOSIS — I10 Essential (primary) hypertension: Secondary | ICD-10-CM

## 2023-12-26 DIAGNOSIS — K8020 Calculus of gallbladder without cholecystitis without obstruction: Secondary | ICD-10-CM | POA: Diagnosis not present

## 2023-12-26 DIAGNOSIS — R42 Dizziness and giddiness: Secondary | ICD-10-CM | POA: Diagnosis not present

## 2023-12-26 LAB — URINALYSIS, ROUTINE W REFLEX MICROSCOPIC
Bilirubin Urine: NEGATIVE
Glucose, UA: NEGATIVE mg/dL
Hgb urine dipstick: NEGATIVE
Ketones, ur: NEGATIVE mg/dL
Leukocytes,Ua: NEGATIVE
Nitrite: NEGATIVE
Protein, ur: NEGATIVE mg/dL
Specific Gravity, Urine: 1.005 (ref 1.005–1.030)
pH: 7 (ref 5.0–8.0)

## 2023-12-26 LAB — CBC
HCT: 39 % (ref 36.0–46.0)
Hemoglobin: 12.3 g/dL (ref 12.0–15.0)
MCH: 27.9 pg (ref 26.0–34.0)
MCHC: 31.5 g/dL (ref 30.0–36.0)
MCV: 88.4 fL (ref 80.0–100.0)
Platelets: 324 K/uL (ref 150–400)
RBC: 4.41 MIL/uL (ref 3.87–5.11)
RDW: 14.6 % (ref 11.5–15.5)
WBC: 6.7 K/uL (ref 4.0–10.5)
nRBC: 0 % (ref 0.0–0.2)

## 2023-12-26 LAB — RAPID URINE DRUG SCREEN, HOSP PERFORMED
Amphetamines: NOT DETECTED
Barbiturates: NOT DETECTED
Benzodiazepines: POSITIVE — AB
Cocaine: NOT DETECTED
Opiates: NOT DETECTED
Tetrahydrocannabinol: NOT DETECTED

## 2023-12-26 LAB — BASIC METABOLIC PANEL WITH GFR
Anion gap: 12 (ref 5–15)
BUN: 12 mg/dL (ref 8–23)
CO2: 24 mmol/L (ref 22–32)
Calcium: 8.7 mg/dL — ABNORMAL LOW (ref 8.9–10.3)
Chloride: 105 mmol/L (ref 98–111)
Creatinine, Ser: 1 mg/dL (ref 0.44–1.00)
GFR, Estimated: >60 mL/min (ref >60)
Glucose, Bld: 121 mg/dL — ABNORMAL HIGH (ref 70–99)
Potassium: 4 mmol/L (ref 3.5–5.1)
Sodium: 141 mmol/L (ref 135–145)

## 2023-12-26 LAB — TSH: TSH: 14.332 u[IU]/mL — ABNORMAL HIGH (ref 0.350–4.500)

## 2023-12-26 LAB — TROPONIN I (HIGH SENSITIVITY)
Troponin I (High Sensitivity): 13 ng/L (ref <18)
Troponin I (High Sensitivity): 18 ng/L — ABNORMAL HIGH (ref <18)

## 2023-12-26 MED ORDER — LOSARTAN POTASSIUM 50 MG PO TABS
100.0000 mg | ORAL_TABLET | Freq: Every day | ORAL | Status: DC
  Administered 2023-12-26 – 2023-12-28 (×3): 100 mg via ORAL
  Filled 2023-12-26 (×3): qty 2

## 2023-12-26 MED ORDER — ALPRAZOLAM 0.5 MG PO TABS
1.0000 mg | ORAL_TABLET | Freq: Three times a day (TID) | ORAL | Status: DC | PRN
  Administered 2023-12-26 – 2023-12-28 (×5): 1 mg via ORAL
  Filled 2023-12-26 (×5): qty 2

## 2023-12-26 MED ORDER — HYDROCHLOROTHIAZIDE 25 MG PO TABS
25.0000 mg | ORAL_TABLET | Freq: Every day | ORAL | Status: DC
  Administered 2023-12-26 – 2023-12-29 (×4): 25 mg via ORAL
  Filled 2023-12-26 (×4): qty 1

## 2023-12-26 MED ORDER — SODIUM CHLORIDE 0.9% FLUSH
3.0000 mL | Freq: Two times a day (BID) | INTRAVENOUS | Status: DC
  Administered 2023-12-26 – 2023-12-29 (×5): 3 mL via INTRAVENOUS

## 2023-12-26 MED ORDER — LABETALOL HCL 5 MG/ML IV SOLN
10.0000 mg | Freq: Once | INTRAVENOUS | Status: AC
  Administered 2023-12-26: 10 mg via INTRAVENOUS
  Filled 2023-12-26: qty 4

## 2023-12-26 MED ORDER — ALBUTEROL SULFATE (2.5 MG/3ML) 0.083% IN NEBU: 2.5000 mg | INHALATION_SOLUTION | RESPIRATORY_TRACT | Status: DC | PRN

## 2023-12-26 MED ORDER — MELATONIN 3 MG PO TABS
6.0000 mg | ORAL_TABLET | Freq: Every evening | ORAL | Status: DC | PRN
  Administered 2023-12-27: 6 mg via ORAL
  Filled 2023-12-26 (×2): qty 2

## 2023-12-26 MED ORDER — ROSUVASTATIN CALCIUM 20 MG PO TABS
20.0000 mg | ORAL_TABLET | Freq: Every day | ORAL | Status: DC
  Administered 2023-12-26 – 2023-12-29 (×4): 20 mg via ORAL
  Filled 2023-12-26 (×4): qty 1

## 2023-12-26 MED ORDER — LABETALOL HCL 5 MG/ML IV SOLN: 10.0000 mg | INTRAVENOUS | Status: DC | PRN

## 2023-12-26 MED ORDER — POLYETHYLENE GLYCOL 3350 17 G PO PACK
17.0000 g | PACK | Freq: Every day | ORAL | Status: DC | PRN
  Administered 2023-12-29: 17 g via ORAL
  Filled 2023-12-26: qty 1

## 2023-12-26 MED ORDER — LEVOTHYROXINE SODIUM 75 MCG PO TABS
175.0000 ug | ORAL_TABLET | Freq: Every day | ORAL | Status: DC
  Administered 2023-12-26 – 2023-12-29 (×4): 175 ug via ORAL
  Filled 2023-12-26 (×4): qty 1

## 2023-12-26 MED ORDER — ONDANSETRON HCL 4 MG/2ML IJ SOLN: 4.0000 mg | Freq: Four times a day (QID) | INTRAMUSCULAR | Status: DC | PRN

## 2023-12-26 MED ORDER — ACETAMINOPHEN 500 MG PO TABS
1000.0000 mg | ORAL_TABLET | Freq: Four times a day (QID) | ORAL | Status: DC | PRN
  Administered 2023-12-27: 1000 mg via ORAL
  Filled 2023-12-26: qty 2

## 2023-12-26 MED ORDER — CARVEDILOL 25 MG PO TABS
25.0000 mg | ORAL_TABLET | Freq: Two times a day (BID) | ORAL | Status: DC
  Administered 2023-12-26 – 2023-12-29 (×8): 25 mg via ORAL
  Filled 2023-12-26 (×7): qty 1
  Filled 2023-12-26: qty 2

## 2023-12-26 MED ORDER — HYDRALAZINE HCL 20 MG/ML IJ SOLN
10.0000 mg | Freq: Once | INTRAMUSCULAR | Status: AC
  Administered 2023-12-26: 10 mg via INTRAVENOUS
  Filled 2023-12-26: qty 1

## 2023-12-26 MED ORDER — AMLODIPINE BESYLATE 5 MG PO TABS
5.0000 mg | ORAL_TABLET | Freq: Every day | ORAL | Status: DC
  Administered 2023-12-26 – 2023-12-29 (×4): 5 mg via ORAL
  Filled 2023-12-26 (×4): qty 1

## 2023-12-26 MED ORDER — NICOTINE POLACRILEX 2 MG MT GUM: 2.0000 mg | CHEWING_GUM | OROMUCOSAL | Status: DC | PRN

## 2023-12-26 NOTE — ED Notes (Signed)
 Pt has been Npo since midnight except for sips with meds

## 2023-12-26 NOTE — ED Notes (Signed)
 EMS admin 1 nitroglycerin and 324 aspirin pta, pt reports relief in chest pain.

## 2023-12-26 NOTE — Progress Notes (Signed)
 PROGRESS NOTE  Virginia Romero  FMW:[id number] DOB: December 19, 1955 DOA: 12/26/2023 PCP: Avelina Greig BRAVO, MD  Consultants  Brief Narrative: 68 y.o. female with hx of HTN, DM type 2, hypothyroidism, mood disorder, who presents with severe range hypertension. She notes that her BP is chronically uncontrolled, can range 180 - 220s systolic at home. Today she was feeling weird', clarifies that she was feeling very anxious and emotionally elevated. Did report fleeting episode of chest pain, which is not as bad as what I get when I'm out doing yardwork. Does have intermittent chest pain with activity. No shortness of breath. She has a mild headache. Denies any vision changes, speech changes, focal numbness or weakness. Daughter reports she woke up and asked why she killed a bird, which was unusual. But patient writes this off as she was dreaming about birds. She has been taking her home Losartan  and Metoprolol  and last took yesterday morning.    Assessment & Plan: Hypertensive emergency Chronically uncontrolled BP reportedly 180 -220s systolic at home. Per EMS reportedly up to 300 mmHg in field. However in the ED max BP of 242/106; calc MAP 151.  - BP has been better since admission and she is now on home Labetalol 100 mg, and switch Metoprolol  to Carvedilol 25 mg BID.  -- Amlodipine 5 mg also started. -- Labetalol 10 mg IV q 2 hr prn for SBP > 200  -- Goal systolic less than 180 and MAP less than 112. -Of note she was also on HCTZ outpatient.  Will restart this -- Workup for secondary HTN, renal vascular US , renin / aldo, plasma metanephrine, UDS mostly still pending. -No diagnosis of sleep apnea as she does have body habitus/neck circumference concerning for this   Smoking cessation  Current smoker at 3 cigarettes per day  -- Counseled on smoking cessation, declines NRT    Incidental findings:  Likely remote infarct R ventricular nucleus: Would defer antiPLT during marked elevation in BP, but should  consider starting once better controlled    DM type 2: A1c last month 6.3%. On Metformin  outpatient. Not currently requiring SSI   HLD: continue home Rosuvastatin    Hypothyroidism: Continue home Levothyroxine , TSH elevated to 14.  Unclear how much she has been taking this med, could definitely be contributing to #1 above.    Mood d/o: Continue home Xanax  1 mg TID.  She tells me today this is PRN, will switch as such here   Body mass index is 36.13 kg/m. Obesity class I, would benefit from weight loss outpatient      DVT prophylaxis:  SCDs Start: 12/26/23 0447  Code Status:   Code Status: Full Code Level of care: Progressive Status is: Inpatient Dispo: Pending further workup and treatment for elevated blood pressure    Subjective: Patient lying in bed on my exam.  No complaints.  She is worried about how high her blood pressure has been.  Reports that 170s to 190s systolic is normal for her.  Objective: Vitals:   12/26/23 1200 12/26/23 1230 12/26/23 1300 12/26/23 1400  BP: (!) 160/105 (!) 166/72 (!) 183/105 (!) 157/96  Pulse: 80 69 77 68  Resp: 19 (!) 22    Temp:   98.6 F (37 C)   TempSrc:   Oral   SpO2: 100% 94% 96% 93%  Weight:      Height:        Intake/Output Summary (Last 24 hours) at 12/26/2023 1657 Last data filed at 12/26/2023 1248 Gross per 24  hour  Intake 60 ml  Output --  Net 60 ml   Filed Weights   12/26/23 0110  Weight: 83.9 kg   Body mass index is 36.13 kg/m.  Gen: 68 y.o. female in no apparent distress.  Nontoxic Pulm: Non-labored breathing.  Clear to auscultation bilaterally.  CV: Regular rate and rhythm.  GI: Abdomen soft, non-tender, non-distended, with normoactive bowel sounds. No organomegaly or masses felt. Ext: Warm, no deformities, trace bilateral pedal edema Skin: No rashes, lesions  Neuro: Alert and oriented. No focal neurological deficits. Psych: Calm  Judgement and insight appear normal. Mood & affect appropriate.     I have  personally reviewed the following labs and images: CBC: Recent Labs  Lab 12/26/23 0140  WBC 6.7  HGB 12.3  HCT 39.0  MCV 88.4  PLT 324   BMP &GFR Recent Labs  Lab 12/26/23 0140  NA 141  K 4.0  CL 105  CO2 24  GLUCOSE 121*  BUN 12  CREATININE 1.00  CALCIUM  8.7*   Estimated Creatinine Clearance: 52.5 mL/min (by C-G formula based on SCr of 1 mg/dL). Liver & Pancreas: No results for input(s): AST, ALT, ALKPHOS, BILITOT, PROT, ALBUMIN in the last 168 hours. No results for input(s): LIPASE, AMYLASE in the last 168 hours. No results for input(s): AMMONIA in the last 168 hours. Diabetic: No results for input(s): HGBA1C in the last 72 hours. No results for input(s): GLUCAP in the last 168 hours. Cardiac Enzymes: No results for input(s): CKTOTAL, CKMB, CKMBINDEX, TROPONINI in the last 168 hours. No results for input(s): PROBNP in the last 8760 hours. Coagulation Profile: No results for input(s): INR, PROTIME in the last 168 hours. Thyroid  Function Tests: Recent Labs    12/26/23 0340  TSH 14.332*   Lipid Profile: No results for input(s): CHOL, HDL, LDLCALC, TRIG, CHOLHDL, LDLDIRECT in the last 72 hours. Anemia Panel: No results for input(s): VITAMINB12, FOLATE, FERRITIN, TIBC, IRON, RETICCTPCT in the last 72 hours. Urine analysis:    Component Value Date/Time   COLORURINE STRAW (A) 12/26/2023 0130   APPEARANCEUR CLEAR 12/26/2023 0130   LABSPEC 1.005 12/26/2023 0130   PHURINE 7.0 12/26/2023 0130   GLUCOSEU NEGATIVE 12/26/2023 0130   HGBUR NEGATIVE 12/26/2023 0130   BILIRUBINUR NEGATIVE 12/26/2023 0130   KETONESUR NEGATIVE 12/26/2023 0130   PROTEINUR NEGATIVE 12/26/2023 0130   UROBILINOGEN 0.2 07/07/2014 0910   NITRITE NEGATIVE 12/26/2023 0130   LEUKOCYTESUR NEGATIVE 12/26/2023 0130   Sepsis Labs: Invalid input(s): PROCALCITONIN, LACTICIDVEN  Microbiology: No results found for this or any previous  visit (from the past 240 hours).  Radiology Studies: VAS US  RENAL ARTERY DUPLEX Result Date: 12/26/2023 ABDOMINAL VISCERAL Patient Name:  SELA FALK  Date of Exam:   12/26/2023 Medical Rec #: [id number]       Accession #:    [id number] Date of Birth: Oct 14, 1955       Patient Gender: F Patient Age:   40 years Exam Location:  [facility] Procedure:      VAS US  RENAL ARTERY DUPLEX Referring Phys: [id number] DORN DAWSON -------------------------------------------------------------------------------- Indications: Hypertension High Risk Factors: Hypertension. Limitations: Obesity, air/bowel gas and heavy breathing and coughing. Performing Technologist: Elmarie Lindau, RVT  Examination Guidelines: A complete evaluation includes B-mode imaging, spectral Doppler, color Doppler, and power Doppler as needed of all accessible portions of each vessel. Bilateral testing is considered an integral part of a complete examination. Limited examinations for reoccurring indications may be performed as noted.  Duplex Findings: +----------+--------+--------+------+--------+ MesentericPSV cm/sEDV  cm/sPlaqueComments +----------+--------+--------+------+--------+ Aorta Prox   50                          +----------+--------+--------+------+--------+    +------------------+--------+--------+-------+ Right Renal ArteryPSV cm/sEDV cm/sComment +------------------+--------+--------+-------+ Origin               85      22           +------------------+--------+--------+-------+ Proximal            174      27           +------------------+--------+--------+-------+ Mid                 164      38           +------------------+--------+--------+-------+ Distal              123      22           +------------------+--------+--------+-------+ +-----------------+--------+--------+--------+ Left Renal ArteryPSV cm/sEDV cm/sComment  +-----------------+--------+--------+--------+ Origin                            not seen +-----------------+--------+--------+--------+ Proximal                         not seen +-----------------+--------+--------+--------+ Mid                              not seen +-----------------+--------+--------+--------+ Distal             102      23            +-----------------+--------+--------+--------+  Technologist observations: Patent right renal vein Patent left renal vein +------------+--------+--------+----+-----------+--------+--------+----+ Right KidneyPSV cm/sEDV cm/sRI  Left KidneyPSV cm/sEDV cm/sRI   +------------+--------+--------+----+-----------+--------+--------+----+ Upper Pole  20      7       0.65Upper Pole 16      5       0.69 +------------+--------+--------+----+-----------+--------+--------+----+ Mid         18      7       0.                             +------------+--------+--------+----+-----------+--------+--------+----+ Lower Pole  20      6       0.70Lower Pole                      +------------+--------+--------+----+-----------+--------+--------+----+ Hilar                           Hilar                           +------------+--------+--------+----+-----------+--------+--------+----+ +------------------+-----+------------------+-----+ Right Kidney           Left Kidney             +------------------+-----+------------------+-----+ RAR                    RAR                     +------------------+-----+------------------+-----+ RAR (manual)      3.5  RAR (manual)            +------------------+-----+------------------+-----+  Cortex                 Cortex                  +------------------+-----+------------------+-----+ Cortex thickness       Corex thickness         +------------------+-----+------------------+-----+ Kidney length (cm)11.62Kidney length (cm)11.40 +------------------+-----+------------------+-----+  Summary: Renal:  Right: Normal size  right kidney. Normal right Resisitive Index. No        evidence of right renal artery stenosis. Left:  Normal size of left kidney. The left RI and stenosis is not        determined due to the inability to visualize the entire left        renal artery.  *See table(s) above for measurements and observations.  Diagnosing physician: Norman Serve  Electronically signed by Norman Serve on 12/26/2023 at 4:04:03 PM.    Final    CT HEAD WO CONTRAST ( ) Result Date: 12/26/2023 EXAM: CT HEAD WITHOUT CONTRAST 12/26/2023 04:26:20 AM TECHNIQUE: CT of the head was performed without the administration of intravenous contrast. Automated exposure control, iterative reconstruction, and/or weight based adjustment of the mA/kV was utilized to reduce the radiation dose to as low as reasonably achievable. COMPARISON: 10/31/2003 CLINICAL HISTORY: Headache, increasing frequency or severity, hypertension. FINDINGS: BRAIN AND VENTRICLES: No acute hemorrhage. Gray-white differentiation is preserved. No hydrocephalus. No extra-axial collection. No mass effect or midline shift. Periventricular and scattered subcortical white matter hypoattenuation is moderately advanced for age. A nonhemorrhagic lacunar infarct in the right ventricular nucleus is age indeterminate but likely remote. ORBITS: No acute abnormality. SINUSES: No acute abnormality. SOFT TISSUES AND SKULL: No acute soft tissue abnormality. No skull fracture. VASCULATURE: Atherosclerotic calcifications are present in the cavernous carotid arteries bilaterally and at the dural margin of both vertebral arteries. No hyperdense vessel is present. IMPRESSION: 1. No acute intracranial abnormality. 2. Moderately advanced periventricular and scattered subcortical white matter hypoattenuation for age. 3. Age indeterminate but likely remote nonhemorrhagic lacunar infarct in the right ventricular nucleus. Electronically signed by: Lonni Necessary MD 12/26/2023 04:36 AM EDT RP  Workstation: [id number]   DG Chest Port 1 View Result Date: 12/26/2023 CLINICAL DATA:  Hypertension and dizziness. EXAM: PORTABLE CHEST 1 VIEW COMPARISON:  August 27, 2020 FINDINGS: The heart size and mediastinal contours are within normal limits. Both lungs are clear. Multilevel degenerative changes are seen throughout the thoracic spine. IMPRESSION: No active disease. Electronically Signed   By: Suzen Dials M.D.   On: 12/26/2023 01:54    Scheduled Meds:  amLODipine  5 mg Oral Daily   carvedilol  25 mg Oral BID WC   levothyroxine   175 mcg Oral Q0600   losartan   100 mg Oral Daily   rosuvastatin   20 mg Oral Daily   sodium chloride  flush  3 mL Intravenous Q12H   Continuous Infusions:   LOS: 0 days   35 minutes with more than 50% spent in reviewing records, counseling patient/family and coordinating care.  Reyes VEAR Gaw, MD [facility] [email or web address] 12/26/2023, 4:57 PM

## 2023-12-26 NOTE — ED Triage Notes (Signed)
 Arrives [facility] for hypertension and dizziness.   Says she began feeling dizzy tonight, checked bp and sbp was 200. She then became afraid and called 911.   Upon paramedic arrival sbp was 300.   Hx of HTN and compliant on all antihypertensives.

## 2023-12-26 NOTE — Plan of Care (Signed)

## 2023-12-26 NOTE — H&P (Signed)
 History and Physical    Virginia Romero FMW:[id number] DOB: 01/29/1956 DOA: 12/26/2023  PCP: Avelina Greig BRAVO, MD   Patient coming from: Home   Chief Complaint:  Chief Complaint  Patient presents with   Hypertension    HPI:  Virginia Romero is a 68 y.o. female with hx of HTN, DM type 2, hypothyroidism, mood disorder, who presents with severe range hypertension. She notes that her BP is chronically uncontrolled, can range 180 - 220s systolic at home. Today she was feeling weird', clarifies that she was feeling very anxious and emotionally elevated. Did report fleeting episode of chest pain, which is not as bad as what I get when I'm out doing yardwork. Does have intermittent chest pain with activity. No shortness of breath. She has a mild headache. Denies any vision changes, speech changes, focal numbness or weakness. Daughter reports she woke up and asked why she killed a bird, which was unusual. But patient writes this off as she was dreaming about birds. She has been taking her home Losartan  and Metoprolol  and last took yesterday morning.    Review of Systems:  ROS complete and negative except as marked above   No Known Allergies  Prior to Admission medications   Medication Sig Start Date End Date Taking? Authorizing Provider  ALPRAZolam  (XANAX ) 1 MG tablet TAKE 1 TABLET BY MOUTH THREE TIMES DAILY AS NEEDED FOR ANXIETY 12/09/23   Bedsole, Amy E, MD  DULoxetine  (CYMBALTA ) 30 MG capsule Take 3 capsules (90 mg total) by mouth daily. 04/10/23   Bedsole, Greig BRAVO, MD  hydrochlorothiazide  (HYDRODIURIL ) 25 MG tablet Take 1 tablet by mouth once daily 12/02/23   Bedsole, Amy E, MD  levothyroxine  (SYNTHROID ) 175 MCG tablet Take 1 tablet (175 mcg total) by mouth daily. 04/11/23   Avelina Greig BRAVO, MD  losartan  (COZAAR ) 100 MG tablet Take 1 tablet by mouth once daily 06/09/23   Bedsole, Amy E, MD  metFORMIN  (GLUCOPHAGE ) 500 MG tablet TAKE 2 TABLETS BY MOUTH TWICE DAILY WITH A MEAL 02/10/23   Bedsole, Amy E, MD   metoprolol  tartrate (LOPRESSOR ) 100 MG tablet Take 1 tablet (100 mg total) by mouth 2 (two) times daily. 04/10/23   Avelina Greig BRAVO, MD  rosuvastatin  (CRESTOR ) 20 MG tablet Take 1 tablet by mouth once daily 06/12/23   Bedsole, Amy E, MD  Vitamin D , Ergocalciferol , (DRISDOL ) 1.25 MG (50000 UNIT) CAPS capsule Take 1 capsule by mouth once a week 07/09/23   Avelina Greig BRAVO, MD  Metoprolol  Succinate (TOPROL  XL PO) Take by mouth.  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   Depression    Diabetes mellitus    Type II   Endometrial polyp    GERD (gastroesophageal reflux disease)    History of ovarian cyst    Hyperlipemia    Hypertension    Osteopenia 09/2016   T score -1.5   Positive colorectal cancer screening using Cologuard test 2022   PTSD (post-traumatic stress disorder)    Shortness of breath dyspnea    with anxiety   Thyroid  disease     Past Surgical History:  Procedure Laterality Date   CESAREAN SECTION  '79 AND '86   X2   COLONOSCOPY  with polypectomy   DIAGNOSTIC LAPAROSCOPY  07/17/2006   WITH LYSIS OF PERITUBULAR AND PERIOVARIAN ADHESIONS, LYSIS OF OMENTAL ADHESIONS, LSO, RIGHT  OVARIAN CYSTECTOMY , HYSTEROSCOPY  WITH EXCISION OF ENDOMETRIAL POLYP...   ORIF HUMERUS FRACTURE Left 03/01/2015   Procedure: OPEN REDUCTION INTERNAL FIXATION (ORIF) PROXIMAL HUMERUS FRACTURE;  Surgeon: Marcey Her, MD;  Location: [facility] OR;  Service: Orthopedics;  Laterality: Left;   THYROIDECTOMY  2000   TUBAL LIGATION       reports that she has been smoking cigarettes. She has a 15 pack-year smoking history. She has never used smokeless tobacco. She reports that she does not drink alcohol and does not use drugs.  Family History  Problem Relation Age of Onset   Hypertension Mother    Heart disease Mother    Diabetes  Brother    Heart disease Brother    Depression Brother    Hypertension Maternal Grandmother    Heart disease Maternal Grandmother    Diabetes Maternal Grandfather    Diabetes Paternal Grandfather    Depression Daughter    Colon cancer Neg Hx    Colon polyps Neg Hx    Esophageal cancer Neg Hx    Rectal cancer Neg Hx    Stomach cancer Neg Hx      Physical Exam: Vitals:   12/26/23 0257 12/26/23 0300 12/26/23 0400 12/26/23 0432  BP: (!) 242/106 (!) 242/106 (!) 201/66 (!) 210/82  Pulse:   70 79  Resp:   (!) 24 (!) 21  Temp:      TempSrc:      SpO2:   100% 97%  Weight:      Height:        Gen: Awake, alert, NAD, well appearing   CV: Regular, normal S1, S2, 1/6 SEM  Resp: Normal WOB, CTAB  Abd: Flat, normoactive, nontender MSK: Symmetric, no edema  Skin: No rashes or lesions to exposed skin  Neuro: Alert and interactive, fully oriented, speech is clear without aphasia, CN 2-12 intact, motor is 5/5 and symmetric, and sensation is intact and equal to fine touch.  Psych: euthymic, appropriate    Data review:   Labs reviewed, notable for:   Chemistries unremarkable, Cr 1 at baseline  HS trop neg , r/pt pending  Blood counts normal.   Micro:  Results for orders placed or performed in visit on 07/11/15  Urine culture     Status: None   Collection Time: 07/11/15 10:17 AM  Result Value Ref Range Status   Colony Count NO GROWTH  Final   Organism ID, Bacteria NO GROWTH  Final    Imaging reviewed:  CT HEAD WO CONTRAST ( ) Result Date: 12/26/2023 EXAM: CT HEAD WITHOUT CONTRAST 12/26/2023 04:26:20 AM TECHNIQUE: CT of the head was performed without the administration of intravenous contrast. Automated exposure control, iterative reconstruction, and/or weight based adjustment of the mA/kV was utilized to reduce the radiation dose to as low as reasonably achievable. COMPARISON: 10/31/2003 CLINICAL HISTORY: Headache, increasing frequency or severity, hypertension. FINDINGS: BRAIN  AND VENTRICLES: No acute hemorrhage. Gray-white differentiation is preserved. No hydrocephalus. No extra-axial collection. No mass effect or midline shift. Periventricular and scattered subcortical white matter hypoattenuation is moderately advanced for age. A nonhemorrhagic lacunar infarct in the right ventricular nucleus is age indeterminate but likely remote. ORBITS: No acute abnormality. SINUSES: No acute abnormality. SOFT TISSUES AND SKULL: No acute soft tissue abnormality. No skull fracture. VASCULATURE: Atherosclerotic calcifications are present in the cavernous carotid arteries bilaterally and at the dural  margin of both vertebral arteries. No hyperdense vessel is present. IMPRESSION: 1. No acute intracranial abnormality. 2. Moderately advanced periventricular and scattered subcortical white matter hypoattenuation for age. 3. Age indeterminate but likely remote nonhemorrhagic lacunar infarct in the right ventricular nucleus. Electronically signed by: Lonni Necessary MD 12/26/2023 04:36 AM EDT RP Workstation: [id number]   DG Chest Port 1 View Result Date: 12/26/2023 CLINICAL DATA:  Hypertension and dizziness. EXAM: PORTABLE CHEST 1 VIEW COMPARISON:  August 27, 2020 FINDINGS: The heart size and mediastinal contours are within normal limits. Both lungs are clear. Multilevel degenerative changes are seen throughout the thoracic spine. IMPRESSION: No active disease. Electronically Signed   By: Suzen Dials M.D.   On: 12/26/2023 01:54    EKG:  Personally reviewed SR with LVH, no acute ischemic changes.   ED Course:  Treated with Hydralazine 10 mg, Labetalol 10 mg IV, BP improving with treatment.     Assessment/Plan:  68 y.o. female with hx HTN, DM type 2, hypothyroidism, mood disorder, who presents with hypertensive emergency   Hypertensive emergency Chronically uncontrolled BP reportedly 180 -220s systolic at home. Per EMS reportedly up to 300 mmHg in field. However in the ED max BP of  242/106; calc MAP 151. BP improving with treatment with Hydralazine and Labetalol IV in the ED. There is no clear end organ damage, had fleeting episode of chest pain although EKG nonischemic, HS trop neg. Reports mild HA, Neuro exam reassuring and CT Head with no acute findings.   -- Resume home Labetalol 100 mg, and switch Metoprolol  to Carvedilol 25 mg BID.  -- Add Amlodipine 5 mg daily  -- Labetalol 10 mg IV q 2 hr prn for SBP > 200  -- Will aim for systolic ~ 200 (MAP ~ 135), and by tomorrow aim for SBP < 180; MAP <112  -- Workup for secondary HTN, renal vascular US , renin / aldo, plasma metanephrine, UDS  Smoking cessation  Current smoker at 3 cigarettes per day  -- Counseled on smoking cessation, declines NRT   Incidental findings:  Likely remote infarct R ventricular nucleus: Would defer antiPLT during marked elevation in BP, but should consider starting once better controlled   Chronic medical problems:  DM type 2: A1c last month 6.3%. On Metformin  outpatient. Not currently requiring SSI  HLD: continue home Rosuvastatin   Hypothyroidism: Continue home Levothyroxine , check TSH.  Mood d/o: Continue home Xanax  1 mg TID   Body mass index is 36.13 kg/m. Obesity class I, would benefit from weight loss outpatient    DVT prophylaxis:  SCDs Code Status:  Full Code Diet:  Diet Orders (From admission, onward)     Start     Ordered   12/26/23 0448  Diet 2 gram sodium Room service appropriate? Yes; Fluid consistency: Thin  Diet effective now       Question Answer Comment  Room service appropriate? Yes   Fluid consistency: Thin      12/26/23 0448           Family Communication:  Yes discussed with daughter at bedside   Consults:  None   Admission status:   Observation, Step Down Unit  Severity of Illness: The appropriate patient status for this patient is OBSERVATION. Observation status is judged to be reasonable and necessary in order to provide the required intensity of  service to ensure the patient's safety. The patient's presenting symptoms, physical exam findings, and initial radiographic and laboratory data in the context of their medical condition is felt  to place them at decreased risk for further clinical deterioration. Furthermore, it is anticipated that the patient will be medically stable for discharge from the hospital within 2 midnights of admission.    Dorn Dawson, MD [facility]  How to contact the [facility] Attending or Consulting provider 7A - 7P or covering provider during after hours 7P -7A, for this patient.  Check the care team in [facility] and look for a) attending/consulting [facility] provider listed and b) the [facility] team listed Log into [email or web address] and use [facility]'s universal password to access. If you do not have the password, please contact the hospital operator. Locate the [facility] provider you are looking for under [facility] and page to a number that you can be directly reached. If you still have difficulty reaching the provider, please page the [facility] (Director on Call) for the Hospitalists listed on amion for assistance.  12/26/2023, 4:51 AM

## 2023-12-26 NOTE — Progress Notes (Signed)
 Renal art exam is completed. Virginia Romero, RVT

## 2023-12-26 NOTE — ED Provider Notes (Signed)
 [facility]Y DEPT [facility] Emergency Department Provider Note MRN:  [id number]  Arrival date & time: 12/26/23     Chief Complaint   Hypertension   History of Present Illness   Virginia Romero is a 68 y.o. year-old female with a history of hypertension, diabetes presenting to the ED with chief complaint of dizziness.  Feeling generally unwell today with weakness and dizziness.  EMS was called and they found that her systolic blood pressure was 300.  She received a nitroglycerin tablet on the way here and is feeling a lot better now.  She was having some mild chest discomfort earlier.  Denies shortness of breath, no numbness or weakness to the arms or legs.  Has had issues with medication compliance recently due to costs.  Review of Systems  A thorough review of systems was obtained and all systems are negative except as noted in the HPI and PMH.   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   Depression    Diabetes mellitus    Type II   Endometrial polyp    GERD (gastroesophageal reflux disease)    History of ovarian cyst    Hyperlipemia    Hypertension    Osteopenia 09/2016   T score -1.5   Positive colorectal cancer screening using Cologuard test 2022   PTSD (post-traumatic stress disorder)    Shortness of breath dyspnea    with anxiety   Thyroid  disease     Past Surgical History:  Procedure Laterality Date   CESAREAN SECTION  '79 AND '86   X2   COLONOSCOPY     with polypectomy   DIAGNOSTIC LAPAROSCOPY  07/17/2006   WITH LYSIS OF PERITUBULAR AND PERIOVARIAN ADHESIONS, LYSIS OF OMENTAL ADHESIONS, LSO, RIGHT  OVARIAN CYSTECTOMY , HYSTEROSCOPY  WITH EXCISION OF ENDOMETRIAL POLYP...   ORIF HUMERUS FRACTURE Left 03/01/2015   Procedure: OPEN REDUCTION INTERNAL FIXATION (ORIF) PROXIMAL HUMERUS  FRACTURE;  Surgeon: Marcey Her, MD;  Location: [facility] OR;  Service: Orthopedics;  Laterality: Left;   THYROIDECTOMY  2000   TUBAL LIGATION      Family History  Problem Relation Age of Onset   Hypertension Mother    Heart disease Mother    Diabetes Brother    Heart disease Brother    Depression Brother    Hypertension Maternal Grandmother    Heart disease Maternal Grandmother    Diabetes Maternal Grandfather    Diabetes Paternal Grandfather    Depression Daughter    Colon cancer Neg Hx    Colon polyps Neg Hx    Esophageal cancer Neg Hx    Rectal cancer Neg Hx    Stomach cancer Neg Hx     Social History   Socioeconomic History   Marital status: Widowed    Spouse name: Not on file   Number of children: Not on file   Years of education: Not on file   Highest education level: Not on file  Occupational History   Not on file  Tobacco Use   Smoking status: Some Days    Current packs/day: 0.50    Average packs/day: 0.5 packs/day for 30.0 years (15.0 ttl pk-yrs)    Types: Cigarettes   Smokeless tobacco: Never  Vaping Use   Vaping status:  Never Used  Substance and Sexual Activity   Alcohol use: No   Drug use: No   Sexual activity: Never    Comment: 1 partner in last year  Other Topics Concern   Not on file  Social History Narrative   Not on file   Social Drivers of Health   Financial Resource Strain: Low Risk  (01/08/2023)   Overall Financial Resource Strain (CARDIA)    Difficulty of Paying Living Expenses: Not hard at all  Food Insecurity: Food Insecurity Present (04/21/2023)   Hunger Vital Sign    Worried About Running Out of Food in the Last Year: Never true    Ran Out of Food in the Last Year: Sometimes true  Transportation Needs: No Transportation Needs (04/21/2023)   PRAPARE - Administrator, Civil Service (Medical): No    Lack of Transportation (Non-Medical): No  Physical Activity: Sufficiently Active (01/08/2023)   Exercise Vital Sign    Days of  Exercise per Week: 6 days    Minutes of Exercise per Session: 90 min  Stress: No Stress Concern Present (01/08/2023)   [employer] of Occupational Health - Occupational Stress Questionnaire    Feeling of Stress : Not at all  Social Connections: Socially Isolated (01/08/2023)   Social Connection and Isolation Panel    Frequency of Communication with Friends and Family: Three times a week    Frequency of Social Gatherings with Friends and Family: Twice a week    Attends Religious Services: Never    Database administrator or Organizations: No    Attends Banker Meetings: Never    Marital Status: Widowed  Intimate Partner Violence: Not At Risk (04/21/2023)   Humiliation, Afraid, Rape, and Kick questionnaire    Fear of Current or Ex-Partner: No    Emotionally Abused: No    Physically Abused: No    Sexually Abused: No     Physical Exam   Vitals:   12/26/23 0257 12/26/23 0300  BP: (!) 242/106 (!) 242/106  Pulse:    Resp:    Temp:    SpO2:      CONSTITUTIONAL: Well-appearing, NAD NEURO/PSYCH:  Alert and oriented x 3, normal and symmetric strength and sensation, normal coordination, normal speech EYES:  eyes equal and reactive ENT/NECK:  no LAD, no JVD CARDIO: Regular rate, well-perfused, normal S1 and S2 PULM:  CTAB no wheezing or rhonchi GI/GU:  non-distended, non-tender MSK/SPINE:  No gross deformities, no edema SKIN:  no rash, atraumatic   *Additional and/or pertinent findings included in MDM below  Diagnostic and Interventional Summary    EKG Interpretation Date/Time:  Friday December 26 2023 01:48:02 EDT Ventricular Rate:  67 PR Interval:  160 QRS Duration:  96 QT Interval:  432 QTC Calculation: 457 R Axis:   0  Text Interpretation: Sinus rhythm Left ventricular hypertrophy Confirmed by Theadore Sharper [phone]) on 12/26/2023 1:52:25 AM       Labs Reviewed  BASIC METABOLIC PANEL WITH GFR - Abnormal; Notable for the following components:      Result  Value   Glucose, Bld 121 (*)    Calcium  8.7 (*)    All other components within normal limits  CBC  TROPONIN I (HIGH SENSITIVITY)  TROPONIN I (HIGH SENSITIVITY)    DG Chest Port 1 View  Final Result      Medications  hydrALAZINE (APRESOLINE) injection 10 mg (10 mg Intravenous Given 12/26/23 0300)     Procedures  /  Critical Care .  Critical Care  Performed by: Theadore Ozell HERO, MD Authorized by: Theadore Ozell HERO, MD   Critical care provider statement:    Critical care time (minutes):  45   Critical care was necessary to treat or prevent imminent or life-threatening deterioration of the following conditions: Hypertensive urgency.   Critical care was time spent personally by me on the following activities:  Development of treatment plan with patient or surrogate, discussions with consultants, evaluation of patient's response to treatment, examination of patient, ordering and review of laboratory studies, ordering and review of radiographic studies, ordering and performing treatments and interventions, pulse oximetry, re-evaluation of patient's condition and review of old charts   ED Course and Medical Decision Making  Initial Impression and Ddx Vague symptoms of malaise, fatigue, dizziness throughout the day today.  In the setting of medication noncompliance and severe hypertension.  After nitroglycerin given by EMS she feels a lot better.  Currently without any dizziness, has a normal neurological exam, nothing to suggest stroke at this time.  TIA would be a consideration, possibly posterior circulation insufficiency during the episode of severe hypertension.  Currently with blood pressures of about 220, possibly this is sufficient lowering of blood pressure if her systolic was truly 300 in the field though difficult to confirm the accuracy of this number.  She was having some chest discomfort described as mild.  ACS is considered.  Also considering hypertensive urgency or emergency, endorgan  dysfunction.  Seems to be doing much better, quite well at this time with no symptoms or complaints.  Will monitor blood pressure closely, awaiting labs.  Past medical/surgical history that increases complexity of ED encounter: Hypertension  Interpretation of Diagnostics I personally reviewed the EKG and Laboratory Testing and my interpretation is as follows: Sinus rhythm  No significant blood count or electrolyte disturbance.  Patient Reassessment and Ultimate Disposition/Management     Still having profoundly elevated blood pressures, over 240 systolic.  Will admit to medicine.  Patient management required discussion with the following services or consulting groups:  Hospitalist Service  Complexity of Problems Addressed Acute illness or injury that poses threat of life of bodily function  Additional Data Reviewed and Analyzed Further history obtained from: Prior labs/imaging results  Additional Factors Impacting ED Encounter Risk Consideration of hospitalization  Ozell HERO. Theadore, MD [facility] [email or web address]  Final Clinical Impressions(s) / ED Diagnoses     ICD-10-CM   1. Hypertensive urgency  I16.0       ED Discharge Orders     None        Discharge Instructions Discussed with and Provided to Patient:   Discharge Instructions   None      Theadore Ozell HERO, MD 12/26/23 [phone]

## 2023-12-27 ENCOUNTER — Encounter (HOSPITAL_COMMUNITY): Payer: Self-pay | Admitting: Internal Medicine

## 2023-12-27 ENCOUNTER — Inpatient Hospital Stay (HOSPITAL_COMMUNITY)

## 2023-12-27 DIAGNOSIS — I161 Hypertensive emergency: Secondary | ICD-10-CM | POA: Diagnosis not present

## 2023-12-27 LAB — CBC
HCT: 37 % (ref 36.0–46.0)
Hemoglobin: 11.8 g/dL — ABNORMAL LOW (ref 12.0–15.0)
MCH: 27.2 pg (ref 26.0–34.0)
MCHC: 31.9 g/dL (ref 30.0–36.0)
MCV: 85.3 fL (ref 80.0–100.0)
Platelets: 287 K/uL (ref 150–400)
RBC: 4.34 MIL/uL (ref 3.87–5.11)
RDW: 15 % (ref 11.5–15.5)
WBC: 5.6 K/uL (ref 4.0–10.5)
nRBC: 0 % (ref 0.0–0.2)

## 2023-12-27 LAB — COMPREHENSIVE METABOLIC PANEL WITH GFR
ALT: 11 U/L (ref 0–44)
AST: 11 U/L — ABNORMAL LOW (ref 15–41)
Albumin: 3.2 g/dL — ABNORMAL LOW (ref 3.5–5.0)
Alkaline Phosphatase: 94 U/L (ref 38–126)
Anion gap: 9 (ref 5–15)
BUN: 16 mg/dL (ref 8–23)
CO2: 24 mmol/L (ref 22–32)
Calcium: 8.4 mg/dL — ABNORMAL LOW (ref 8.9–10.3)
Chloride: 103 mmol/L (ref 98–111)
Creatinine, Ser: 1.18 mg/dL — ABNORMAL HIGH (ref 0.44–1.00)
GFR, Estimated: 51 mL/min — ABNORMAL LOW (ref >60)
Glucose, Bld: 121 mg/dL — ABNORMAL HIGH (ref 70–99)
Potassium: 4 mmol/L (ref 3.5–5.1)
Sodium: 136 mmol/L (ref 135–145)
Total Bilirubin: 0.7 mg/dL (ref 0.0–1.2)
Total Protein: 6.3 g/dL — ABNORMAL LOW (ref 6.5–8.1)

## 2023-12-27 LAB — TROPONIN I (HIGH SENSITIVITY): Troponin I (High Sensitivity): 14 ng/L (ref <18)

## 2023-12-27 LAB — HIV ANTIBODY (ROUTINE TESTING W REFLEX): HIV Screen 4th Generation wRfx: NONREACTIVE

## 2023-12-27 MED ORDER — IOHEXOL 350 MG/ML SOLN
100.0000 mL | Freq: Once | INTRAVENOUS | Status: AC | PRN
  Administered 2023-12-27: 100 mL via INTRAVENOUS

## 2023-12-27 NOTE — Plan of Care (Signed)

## 2023-12-27 NOTE — Progress Notes (Signed)
 PROGRESS NOTE  Virginia Romero  FMW:[id number] DOB: [birth year] DOA: 12/26/2023 PCP: Avelina Greig BRAVO, MD  Consultants  Brief Narrative: 68 y.o. female with hx of HTN, DM type 2, hypothyroidism, mood disorder, who presents with severe range hypertension. She notes that her BP is chronically uncontrolled, can range 180 - 220s systolic at home. Day of admission she was feeling weird', clarifies that she was feeling very anxious and emotionally elevated. Did report fleeting episode of chest pain, which is not as bad as what I get when I'm out doing yardwork.  Brought to ER for the same and en route EMS reported systolic blood pressure greater than 300.  In ER it was 260.  She was started on IV labetalol  and hydralazine  in the emergency department.  Her home labetalol  was also started and her home twice daily long-acting metoprolol  switched to long-acting carvedilol  as well as amlodipine  added.  Blood pressure has slowly come down since admission.   Assessment & Plan: Hypertensive emergency -Currently on combination of p.o. labetalol , Coreg , amlodipine , hydrochlorothiazide . -BP today has been better in the 140s to 160s systolic. -- Workup for secondary HTN, renal vascular US , renin / aldo, plasma metanephrine, UDS mostly still pending.  Renal vascular duplex showed normal right renal artery without stenosis but was unable to visualize left renal artery. -I have ordered a CTA of her abdomen to better visualize left renal artery. -TSH noted to be about 15, see below.  Likely contributing to poor blood pressure control. - She also reports that she snores on an outpatient basis.  She has never had a sleep study, OSA could also be contributing. -Very slight bump in creatinine today to 1.18.  She was 1.16 on admission.   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 On Metformin  outpatient. Not currently requiring SSI   HLD: continue home Rosuvastatin    Hypothyroidism:  -  Has been unable to afford her home medications due to financial constraints. - TSH elevated to 15.   - Plan is to continue home levothyroxine  at current dose rather than increase the dose because she is simply not been taking it on a regular basis and this is likely leading to the abnormal TSH.   - Will need to have outpatient recheck in 6 weeks - She has declined financial assistance in the past.  Mood d/o:  - Continue home Xanax  1 mg TID.  She tells me today this is PRN, will switch as such here   Body mass index is 36.13 kg/m. Obesity class I, would benefit from weight loss outpatient  Financial difficulties: - will consult TOC for any further recommendations - Will also attempt to discharge her with her home medications from the hospital so that she will have some at home until she can see her PCP.    DVT prophylaxis:  SCDs Start: 12/26/23 0447  Code Status:   Code Status: Full Code Level of care: Progressive Status is: Inpatient Dispo: Pending further workup and treatment for elevated blood pressure    Subjective: Patient lying in bed on my exam.  Remains without complaints today.  No chest pain, shortness of breath, lower extremity edema.  Feels better than she has in quite some time.  Objective: Vitals:   12/27/23 0849 12/27/23 0850 12/27/23 1304 12/27/23 1652  BP: (!) 144/68 (!) 144/68 123/61 (!) 183/76  Pulse: 76 76 60 70  Resp: 20  18 20   Temp: 97.7 F (36.5 C)  98.5 F (36.9 C) 98.2 F (36.8 C)  TempSrc: Oral  Oral Oral  SpO2: 95% 97% 95% 98%  Weight:      Height:        Intake/Output Summary (Last 24 hours) at 12/27/2023 1725 Last data filed at 12/27/2023 1314 Gross per 24 hour  Intake 580 ml  Output --  Net 580  ml   Filed Weights   12/26/23 0110  Weight: 83.9 kg   Body mass index is 36.13 kg/m.  Gen: 67 y.o. female in no apparent distress.  Nontoxic Pulm: Non-labored breathing.  Clear to auscultation bilaterally.  CV: Regular rate and rhythm.  GI: Abdomen soft, non-tender, non-distended, with normoactive bowel sounds. No organomegaly or masses felt. Ext: Warm, no deformities, trace bilateral pedal edema Skin: No rashes, lesions  Neuro: Alert and oriented. No focal neurological deficits. Psych: Calm  Judgement and insight appear normal. Mood & affect appropriate.     I have personally reviewed the following labs and images: CBC: Recent Labs  Lab 12/26/23 0140 12/27/23 0247  WBC 6.7 5.6  HGB 12.3 11.8*  HCT 39.0 37.0  MCV 88.4 85.3  PLT 324 287   BMP &GFR Recent Labs  Lab 12/26/23 0140 12/27/23 0247  NA 141 136  K 4.0 4.0  CL 105 103  CO2 24 24  GLUCOSE 121* 121*  BUN 12 16  CREATININE 1.00 1.18*  CALCIUM  8.7* 8.4*   Estimated Creatinine Clearance: 44.5 mL/min (A) (by C-G formula based on SCr of 1.18 mg/dL (H)). Liver & Pancreas: Recent Labs  Lab 12/27/23 0247  AST 11*  ALT 11  ALKPHOS 94  BILITOT 0.7  PROT 6.3*  ALBUMIN 3.2*   No results for input(s): LIPASE, AMYLASE in the last 168 hours. No results for input(s): AMMONIA in the last 168 hours. Diabetic: No results for input(s): HGBA1C in the last 72 hours. No results for input(s): GLUCAP in the last 168 hours. Cardiac Enzymes: No results for input(s): CKTOTAL, CKMB, CKMBINDEX, TROPONINI in the last 168 hours. No results for input(s): PROBNP in the last 8760 hours. Coagulation Profile: No results for input(s): INR, PROTIME in the last 168 hours. Thyroid  Function Tests: Recent Labs    12/26/23 0340  TSH 14.332*   Lipid Profile: No results for input(s): CHOL, HDL, LDLCALC, TRIG, CHOLHDL, LDLDIRECT in the last 72 hours. Anemia Panel: No results for input(s):  VITAMINB12, FOLATE, FERRITIN, TIBC, IRON, RETICCTPCT in the last 72 hours. Urine analysis:    Component Value Date/Time   COLORURINE STRAW (A) 12/26/2023 0130   APPEARANCEUR CLEAR 12/26/2023 0130   LABSPEC 1.005 12/26/2023 0130   PHURINE 7.0 12/26/2023 0130   GLUCOSEU NEGATIVE 12/26/2023 0130   HGBUR NEGATIVE 12/26/2023 0130   BILIRUBINUR NEGATIVE 12/26/2023 0130   KETONESUR NEGATIVE 12/26/2023 0130   PROTEINUR NEGATIVE 12/26/2023 0130   UROBILINOGEN 0.2 07/07/2014 0910   NITRITE NEGATIVE 12/26/2023 0130   LEUKOCYTESUR NEGATIVE 12/26/2023 0130   Sepsis Labs: Invalid input(s): PROCALCITONIN, LACTICIDVEN  Microbiology: No results found for this or any previous visit (from the past 240 hours).  Radiology Studies: No results found.   Scheduled Meds:  amLODipine   5 mg Oral Daily   carvedilol   25 mg Oral BID WC   hydrochlorothiazide   25 mg Oral Daily  levothyroxine   175 mcg Oral Q0600   losartan   100 mg Oral Daily   rosuvastatin   20 mg Oral Daily   sodium chloride  flush  3 mL Intravenous Q12H   Continuous Infusions:   LOS: 1 day   35 minutes with more than 50% spent in reviewing records, counseling patient/family and coordinating care.  Reyes VEAR Gaw, MD [facility] [email or web address] 12/27/2023, 5:25 PM

## 2023-12-28 ENCOUNTER — Inpatient Hospital Stay (HOSPITAL_COMMUNITY)

## 2023-12-28 DIAGNOSIS — I161 Hypertensive emergency: Secondary | ICD-10-CM | POA: Diagnosis not present

## 2023-12-28 LAB — BASIC METABOLIC PANEL WITH GFR
Anion gap: 13 (ref 5–15)
BUN: 22 mg/dL (ref 8–23)
CO2: 24 mmol/L (ref 22–32)
Calcium: 8.7 mg/dL — ABNORMAL LOW (ref 8.9–10.3)
Chloride: 99 mmol/L (ref 98–111)
Creatinine, Ser: 1.38 mg/dL — ABNORMAL HIGH (ref 0.44–1.00)
GFR, Estimated: 42 mL/min — ABNORMAL LOW (ref >60)
Glucose, Bld: 119 mg/dL — ABNORMAL HIGH (ref 70–99)
Potassium: 3.8 mmol/L (ref 3.5–5.1)
Sodium: 136 mmol/L (ref 135–145)

## 2023-12-28 LAB — CBC
HCT: 35.3 % — ABNORMAL LOW (ref 36.0–46.0)
Hemoglobin: 11.3 g/dL — ABNORMAL LOW (ref 12.0–15.0)
MCH: 27.4 pg (ref 26.0–34.0)
MCHC: 32 g/dL (ref 30.0–36.0)
MCV: 85.7 fL (ref 80.0–100.0)
Platelets: 258 K/uL (ref 150–400)
RBC: 4.12 MIL/uL (ref 3.87–5.11)
RDW: 15.2 % (ref 11.5–15.5)
WBC: 5 K/uL (ref 4.0–10.5)
nRBC: 0 % (ref 0.0–0.2)

## 2023-12-28 MED ORDER — LORAZEPAM 2 MG/ML IJ SOLN
2.0000 mg | Freq: Once | INTRAMUSCULAR | Status: AC
  Administered 2023-12-28: 2 mg via INTRAVENOUS
  Filled 2023-12-28: qty 1

## 2023-12-28 MED ORDER — POTASSIUM CHLORIDE CRYS ER 20 MEQ PO TBCR
20.0000 meq | EXTENDED_RELEASE_TABLET | Freq: Once | ORAL | Status: AC
  Administered 2023-12-28: 20 meq via ORAL
  Filled 2023-12-28: qty 1

## 2023-12-28 MED ORDER — GADOBUTROL 1 MMOL/ML IV SOLN
8.5000 mL | Freq: Once | INTRAVENOUS | Status: AC | PRN
  Administered 2023-12-28: 8.5 mL via INTRAVENOUS

## 2023-12-28 NOTE — Progress Notes (Signed)
 PROGRESS NOTE  Virginia Romero  FMW:[id number] DOB: Dec 04, 1955 DOA: 12/26/2023 PCP: Avelina Greig BRAVO, MD  Consultants  Brief Narrative: 68 y.o. female with hx of HTN, DM type 2, hypothyroidism, mood disorder, who presents with severe range hypertension. She notes that her BP is chronically uncontrolled, can range 180 - 220s systolic at home. Day of admission she was feeling weird', clarifies that she was feeling very anxious and emotionally elevated. Did report fleeting episode of chest pain, which is not as bad as what I get when I'm out doing yardwork.  Brought to ER for the same and en route EMS reported systolic blood pressure greater than 300.  In ER it was 260.  She was started on IV labetalol  and hydralazine  in the emergency department.  Her home labetalol  was also started and her home twice daily long-acting metoprolol  switched to long-acting carvedilol  as well as amlodipine  added.  Blood pressure has slowly come down since admission.   Assessment & Plan: Hypertensive emergency -Currently on combination of p.o. labetalol , Coreg , amlodipine , hydrochlorothiazide . -BP today has been better in the 140s to 160s systolic. -- Workup for secondary HTN, renal vascular US , renin / aldo, plasma metanephrine thought to have been pending thus far but looks like it was never drawn.  Reordered today..  - CTA abd performed after renal ultrasound unable to appreciate left renal artery.  CT revealed severe aortic atherosclerosis, dual left renal artery, adrenal adenoma. - Currently multifactorial etiologies for hypertensive crisis: Hypothyroidism, adrenal adenoma, possible sleep study. - MRI abdomen w/wo contrast ordered to better differentiate adrenal adenoma  Mural thrombus/aortic atherosclerosis:  - noted on CTA abdomen. -Discussed with Dr. Pearline vascular surgery, who actually thinks this is just severe atherosclerosis rather than actual mural thrombus. - He is more concerned about possibility of  abdominal aortic aneurysm.  Plan is to set her up for duplex in 2 years to see if it has grown at all. - continue to encourage smoking cessation, continue statin, continue blood pressure control.   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 On Metformin  outpatient. Not currently requiring SSI   HLD: continue home Rosuvastatin    Hypothyroidism:  -  Has been unable to afford her home medications due to financial constraints. - TSH elevated to 15.   - Plan is to continue home levothyroxine  at current dose rather than increase the dose because she is simply not been taking it on a regular basis and this is likely leading to the abnormal TSH.   - Will need to have outpatient recheck in 6 weeks - She has declined financial assistance in the past-->consulted TOC for further rec's here  Mood d/o:  - Continue home Xanax  1 mg TID.  She tells me today this is PRN, will switch as such here   Body mass index is 36.13 kg/m. Obesity class I, would benefit from weight loss outpatient  Financial difficulties: - will consult TOC for any further recommendations - Will also attempt to discharge her with her home medications from the hospital so that she will have some at home until she can see her PCP.    DVT prophylaxis:  SCDs Start: 12/26/23 0447  Code Status:   Code Status: Full Code  Level of care: Progressive Status is: Inpatient Dispo: Pending further workup and treatment for elevated blood pressure    Subjective: Patient lying in bed on my exam.  Remains without complaints today.  No chest pain, shortness of breath, lower extremity edema.  Feels better than she has in quite some time.  Anxious about CTA findings but wants  to know what is going on.  Objective: Vitals:   12/28/23 0409 12/28/23 0713 12/28/23 0900 12/28/23 1304  BP: 120/64 131/66 135/75 (!) 145/83  Pulse: 60  (!) 58   Resp: 20 20  20   Temp: 98.4 F (36.9 C) 97.6 F (36.4 C)  97.9 F (36.6 C)  TempSrc: Oral Oral  Oral  SpO2: 98%     Weight:      Height:        Intake/Output Summary (Last 24 hours) at 12/28/2023 1618 Last data filed at 12/28/2023 0957 Gross per 24 hour  Intake 203 ml  Output --  Net 203 ml   Filed Weights   12/26/23 0110  Weight: 83.9 kg   Body mass index is 36.13 kg/m.  Gen: 68 y.o. female in no apparent distress.  Nontoxic Pulm: Non-labored breathing.  Clear to auscultation bilaterally.  CV: Regular rate and rhythm.  GI: Abdomen soft, non-tender, non-distended, with normoactive bowel sounds. No organomegaly or masses felt. Ext: Warm, no deformities, trace bilateral pedal edema Skin: No rashes, lesions  Neuro: Alert and oriented. No focal neurological deficits. Psych: Calm  Judgement and insight appear normal. Mood & affect appropriate.     I have personally reviewed the following labs and images: CBC: Recent Labs  Lab 12/26/23 0140 12/27/23 0247 12/28/23 0504  WBC 6.7 5.6 5.0  HGB 12.3 11.8* 11.3*  HCT 39.0 37.0 35.3*  MCV 88.4 85.3 85.7  PLT 324 287 258   BMP &GFR Recent Labs  Lab 12/26/23 0140 12/27/23 0247 12/28/23 0504  NA 141 136 136  K 4.0 4.0 3.8  CL 105 103 99  CO2 24 24 24   GLUCOSE 121* 121* 119*  BUN 12 16 22   CREATININE 1.00 1.18* 1.38*  CALCIUM  8.7* 8.4* 8.7*   Estimated Creatinine Clearance: 38 mL/min (A) (by C-G formula based on SCr of 1.38 mg/dL (H)). Liver & Pancreas: Recent Labs  Lab 12/27/23 0247  AST 11*  ALT 11  ALKPHOS 94  BILITOT 0.7  PROT 6.3*  ALBUMIN 3.2*   No results for input(s): LIPASE, AMYLASE in the last 168 hours. No results for input(s): AMMONIA in the last 168 hours. Diabetic: No results for input(s): HGBA1C in the last 72  hours. No results for input(s): GLUCAP in the last 168 hours. Cardiac Enzymes: No results for input(s): CKTOTAL, CKMB, CKMBINDEX, TROPONINI in the last 168 hours. No results for input(s): PROBNP in the last 8760 hours. Coagulation Profile: No results for input(s): INR, PROTIME in the last 168 hours. Thyroid  Function Tests: Recent Labs    12/26/23 0340  TSH 14.332*   Lipid Profile: No results for input(s): CHOL, HDL, LDLCALC, TRIG, CHOLHDL, LDLDIRECT in the last 72 hours. Anemia Panel: No results for input(s): VITAMINB12, FOLATE, FERRITIN, TIBC, IRON, RETICCTPCT in the last 72 hours. Urine analysis:    Component Value Date/Time   COLORURINE STRAW (A) 12/26/2023 0130   APPEARANCEUR CLEAR 12/26/2023 0130   LABSPEC 1.005 12/26/2023 0130   PHURINE 7.0 12/26/2023 0130   GLUCOSEU NEGATIVE 12/26/2023 0130   HGBUR NEGATIVE 12/26/2023 0130   BILIRUBINUR NEGATIVE 12/26/2023 0130   KETONESUR NEGATIVE 12/26/2023  0130   PROTEINUR NEGATIVE 12/26/2023 0130   UROBILINOGEN 0.2 07/07/2014 0910   NITRITE NEGATIVE 12/26/2023 0130   LEUKOCYTESUR NEGATIVE 12/26/2023 0130   Sepsis Labs: Invalid input(s): PROCALCITONIN, LACTICIDVEN  Microbiology: No results found for this or any previous visit (from the past 240 hours).  Radiology Studies: No results found.   Scheduled Meds:  amLODipine   5 mg Oral Daily   carvedilol   25 mg Oral BID WC   hydrochlorothiazide   25 mg Oral Daily   levothyroxine   175 mcg Oral Q0600   LORazepam   2 mg Intravenous Once   losartan   100 mg Oral Daily   rosuvastatin   20 mg Oral Daily   sodium chloride  flush  3 mL Intravenous Q12H   Continuous Infusions:   LOS: 2 days   35 minutes with more than 50% spent in reviewing records, counseling patient/family and coordinating care.  Reyes VEAR Gaw, MD [facility] [email or web address] 12/28/2023, 4:18 PM

## 2023-12-28 NOTE — Progress Notes (Signed)
 CCMD informed me that Pt had a 5 beat run of Vtach at 1924. Pt asymptomatic with no chest pain. CCMD also said the Qtc was prolonged on the monitor but not when they measured it by hand. Had an EKG done at 2055 and it showed the Qtc at 466. I informed Dr. Charlton at 2157. Dr. Charlton ordered potassium chloride  SA 20 mEq and a magnesium lab to be drawn in the morning. Will continue to monitor.

## 2023-12-28 NOTE — Plan of Care (Signed)

## 2023-12-29 ENCOUNTER — Other Ambulatory Visit (HOSPITAL_COMMUNITY): Payer: Self-pay

## 2023-12-29 LAB — CBC
HCT: 34.5 % — ABNORMAL LOW (ref 36.0–46.0)
Hemoglobin: 11.1 g/dL — ABNORMAL LOW (ref 12.0–15.0)
MCH: 28 pg (ref 26.0–34.0)
MCHC: 32.2 g/dL (ref 30.0–36.0)
MCV: 87.1 fL (ref 80.0–100.0)
Platelets: 255 K/uL (ref 150–400)
RBC: 3.96 MIL/uL (ref 3.87–5.11)
RDW: 15.3 % (ref 11.5–15.5)
WBC: 5.5 K/uL (ref 4.0–10.5)
nRBC: 0 % (ref 0.0–0.2)

## 2023-12-29 LAB — BASIC METABOLIC PANEL WITH GFR
Anion gap: 11 (ref 5–15)
Anion gap: 14 (ref 5–15)
BUN: 33 mg/dL — ABNORMAL HIGH (ref 8–23)
BUN: 31 mg/dL — ABNORMAL HIGH (ref 8–23)
CO2: 23 mmol/L (ref 22–32)
CO2: 22 mmol/L (ref 22–32)
Calcium: 8.3 mg/dL — ABNORMAL LOW (ref 8.9–10.3)
Calcium: 8.1 mg/dL — ABNORMAL LOW (ref 8.9–10.3)
Chloride: 103 mmol/L (ref 98–111)
Chloride: 101 mmol/L (ref 98–111)
Creatinine, Ser: 1.52 mg/dL — ABNORMAL HIGH (ref 0.44–1.00)
Creatinine, Ser: 1.28 mg/dL — ABNORMAL HIGH (ref 0.44–1.00)
GFR, Estimated: 37 mL/min — ABNORMAL LOW (ref >60)
GFR, Estimated: 46 mL/min — ABNORMAL LOW (ref >60)
Glucose, Bld: 119 mg/dL — ABNORMAL HIGH (ref 70–99)
Glucose, Bld: 177 mg/dL — ABNORMAL HIGH (ref 70–99)
Potassium: 4 mmol/L (ref 3.5–5.1)
Potassium: 3.9 mmol/L (ref 3.5–5.1)
Sodium: 137 mmol/L (ref 135–145)
Sodium: 137 mmol/L (ref 135–145)

## 2023-12-29 LAB — METANEPHRINES, PLASMA
Metanephrine, Free: <25 pg/mL (ref 0.0–88.0)
Normetanephrine, Free: 152.8 pg/mL (ref 0.0–285.2)

## 2023-12-29 LAB — MAGNESIUM: Magnesium: 2.2 mg/dL (ref 1.7–2.4)

## 2023-12-29 LAB — GLUCOSE, CAPILLARY: Glucose-Capillary: 117 mg/dL — ABNORMAL HIGH (ref 70–99)

## 2023-12-29 LAB — CORTISOL: Cortisol, Plasma: 12.9 ug/dL

## 2023-12-29 MED ORDER — LOSARTAN POTASSIUM 100 MG PO TABS
100.0000 mg | ORAL_TABLET | Freq: Every day | ORAL | 0 refills | Status: DC
  Filled 2023-12-29: qty 30, 30d supply, fill #0

## 2023-12-29 MED ORDER — SODIUM CHLORIDE 0.9 % IV BOLUS
1000.0000 mL | Freq: Once | INTRAVENOUS | Status: AC
  Administered 2023-12-29: 1000 mL via INTRAVENOUS

## 2023-12-29 MED ORDER — AMLODIPINE BESYLATE 5 MG PO TABS
5.0000 mg | ORAL_TABLET | Freq: Every day | ORAL | 0 refills | Status: DC
  Filled 2023-12-29: qty 30, 30d supply, fill #0

## 2023-12-29 MED ORDER — CARVEDILOL 25 MG PO TABS
25.0000 mg | ORAL_TABLET | Freq: Two times a day (BID) | ORAL | 0 refills | Status: DC
Start: 2023-12-29 — End: 2024-01-20
  Filled 2023-12-29: qty 60, 30d supply, fill #0

## 2023-12-29 MED ORDER — SODIUM CHLORIDE 0.9 % IV SOLN: INTRAVENOUS | Status: DC

## 2023-12-29 MED ORDER — LEVOTHYROXINE SODIUM 175 MCG PO TABS
175.0000 ug | ORAL_TABLET | Freq: Every day | ORAL | 0 refills | Status: DC
Start: 2023-12-30 — End: 2024-04-19
  Filled 2023-12-29: qty 30, 30d supply, fill #0

## 2023-12-29 MED ORDER — ROSUVASTATIN CALCIUM 20 MG PO TABS
20.0000 mg | ORAL_TABLET | Freq: Every day | ORAL | 3 refills | Status: AC
  Filled 2023-12-29: qty 90, 90d supply, fill #0

## 2023-12-29 NOTE — Plan of Care (Signed)
  Problem: Health Behavior/Discharge Planning: Goal: Ability to manage health-related needs will improve Outcome: Progressing   Problem: Clinical Measurements: Goal: Ability to maintain clinical measurements within normal limits will improve Outcome: Progressing Goal: Will remain free from infection Outcome: Progressing Goal: Diagnostic test results will improve Outcome: Progressing Goal: Respiratory complications will improve Outcome: Progressing Goal: Cardiovascular complication will be avoided Outcome: Progressing   Problem: Activity: Goal: Risk for activity intolerance will decrease Outcome: Progressing   Problem: Nutrition: Goal: Adequate nutrition will be maintained Outcome: Progressing   Problem: Elimination: Goal: Will not experience complications related to bowel motility Outcome: Progressing

## 2023-12-29 NOTE — TOC Initial Note (Signed)
 Transition of Care [facility]) - Initial/Assessment Note    Patient Details  Name: Virginia Romero MRN: [id number] Date of Birth: 1955-08-14  Transition of Care [facility]) CM/SW Contact:    Sudie Erminio Deems, RN Phone Number: 12/29/2023, 4:02 PM  Clinical Narrative:  Patient presented for hypertensive urgency. PTA patient was from home alone. Patient states her son is here from [place] watching her dogs. Patient states she has a daughter that assists as needed. Patient states she has a PCP and gets to appointments either by Taxi vs daughter. Patient reported she has issues with BP meds/cost. MD has written generic meds for BP control. New Rx's to be sent to [facility]. Patient is aware that she can be billed for medications. Case Manager unable to assist with cost since the patient has Medicare. Case Manager discussed home health RN to visit for BP checks, disease and medication management. Patient is agreeable to services- has no agency preference and referral submitted to [facility]. Start of care to begin within 24-48 hours post transition home. No further needs identified at this time.                 Expected Discharge Plan: Home w Home Health Services Barriers to Discharge: No Barriers Identified   Patient Goals and CMS Choice Patient states their goals for this hospitalization and ongoing recovery are:: plan to return home with [facility]   Choice offered to / list presented to :  (Patient did not have a preference for [facility] just to be in network.)      Expected Discharge Plan and Services   Discharge Planning Services: CM Consult Post Acute Care Choice: Home Health Living arrangements for the past 2 months: Single Family Home                   DME Agency: NA       HH Arranged: RN, Disease Management HH Agency: [facility] Date [facility] Agency Contacted: 12/29/23 Time HH Agency Contacted: [phone] Representative spoke with at [facility] Agency: Darleene  Prior Living  Arrangements/Services Living arrangements for the past 2 months: Single Family Home Lives with:: Self (has support of son and daughter.) Patient language and need for interpreter reviewed:: Yes Do you feel safe going back to the place where you live?: Yes      Need for Family Participation in Patient Care: No (Comment) Care giver support system in place?: No (comment)   Criminal Activity/Legal Involvement Pertinent to Current Situation/Hospitalization: No - Comment as needed  Activities of Daily Living   ADL Screening (condition at time of admission) Independently performs ADLs?: Yes (appropriate for developmental age) Is the patient deaf or have difficulty hearing?: No Does the patient have difficulty seeing, even when wearing glasses/contacts?: No Does the patient have difficulty concentrating, remembering, or making decisions?: No  Permission Sought/Granted Permission sought to share information with : Family Supports, Magazine features editor, Case Estate manager/land agent granted to share information with : Yes, Verbal Permission Granted     Permission granted to share info w AGENCY: [facility]        Emotional Assessment Appearance:: Appears stated age Attitude/Demeanor/Rapport: Engaged Affect (typically observed): Appropriate Orientation: : Oriented to Self, Oriented to Place, Oriented to  Time, Oriented to Situation Alcohol / Substance Use: Not Applicable Psych Involvement: No (comment)  Admission diagnosis:  Hypertensive urgency [I16.0] Hypertensive emergency [I16.1] Patient Active Problem List   Diagnosis Date Noted   Hypertensive emergency 12/26/2023   Financial difficulties 11/27/2023  Microalbuminuria 11/27/2023   Labial abscess 01/30/2023   Hyperlipidemia associated with type 2 diabetes mellitus (HCC) 10/11/2020   MDD (major depressive disorder), recurrent episode, moderate (HCC) 08/14/2020   Tobacco abuse 01/13/2019   Chronic insomnia 01/13/2019   Vitamin D   deficiency 10/28/2014   DM type 2 with diabetic dyslipidemia (HCC) 09/08/2013   DJD (degenerative joint disease) of knee 11/19/2012   Class 2 severe obesity with serious comorbidity and body mass index (BMI) of 36.0 to 36.9 in adult [facility]) 11/12/2012   GAD (generalized anxiety disorder) 06/25/2012   Hypertension associated with diabetes (HCC) 06/25/2012   Hypothyroid 06/25/2012   PCP:  Avelina Greig BRAVO, MD Pharmacy:   [facility] [address], [place] - [address] [place] [address] Phone: [phone] Fax: [phone]     Social Drivers of Health (SDOH) Social History: SDOH Screenings   Food Insecurity: No Food Insecurity (12/27/2023)  Housing: Low Risk  (12/27/2023)  Transportation Needs: No Transportation Needs (12/27/2023)  Utilities: Not At Risk (12/27/2023)  Alcohol Screen: Low Risk  (01/08/2023)  Depression (PHQ2-9): High Risk (11/27/2023)  Financial Resource Strain: Low Risk  (01/08/2023)  Physical Activity: Sufficiently Active (01/08/2023)  Social Connections: Unknown (12/27/2023)  Stress: No Stress Concern Present (01/08/2023)  Tobacco Use: High Risk (12/27/2023)  Health Literacy: Adequate Health Literacy (01/08/2023)   SDOH Interventions:     Readmission Risk Interventions     No data to display

## 2023-12-29 NOTE — Discharge Summary (Signed)
 Physician Discharge Summary   Patient: Virginia Romero MRN: [id number] DOB: 1955/08/13  Admit date:     12/26/2023  Discharge date: 12/29/23  Discharge Physician: Reyes VEAR Gaw   PCP: Avelina Greig BRAVO, MD   Recommendations at discharge:    The patient was started on multiple blood pressure medications while in house.  She was discharged home on amlodipine  5 mg nightly, carvedilol  25 mg p.o. twice daily, HCTZ 25 mg daily, losartan  100 mg daily.  These medications were continued except for losartan  which was stopped the day of discharge because of slight increase in her creatinine.   Losartan  was resumed at discharge  She was set up with home health RN to help with blood pressure checks and medication checks Instructed to follow-up with PCP in the next 1 to 2 weeks to ensure blood pressure remains in normal range and to review results of labs which are still pending at discharge, these were reviewed and: Aldosterone ratio and plasma metanephrines. CT angiogram revealed diffuse atherosclerosis of her aorta.  MRI of her brain showed likely remote infarct of right ventricular nucleus.  Defer outpatient antiplatelet therapy with aspirin to PCP, but otherwise maximize blood pressure/HLD control.  Discharge Diagnoses: Principal Problem:   Hypertensive emergency  Resolved Problems:   * No resolved hospital problems. *  Hospital Course: 68 y.o. female with hx of HTN, DM type 2, hypothyroidism, mood disorder, who presents with severe range hypertension. She notes that her BP is chronically uncontrolled, can range 180 - 220s systolic at home. Day of admission she was feeling weird', clarifies that she was feeling very anxious and emotionally elevated. Did report fleeting episode of chest pain, which is not as bad as what I get when I'm out doing yardwork.  Brought to ER for the same and en route EMS reported systolic blood pressure greater than 300.  In ER it was 260.  She was started on IV labetalol   and hydralazine  in the emergency department.  She was also restarted on her home hydrochlorothiazide  and losartan .  Carvedilol  was substituted for metoprolol  at 25 mg p.o. twice daily to help with a little bit better blood pressure control.    We also restarted her home Synthroid .  On day 2 her blood pressure began to come down nicely.  This was controlled with a combination of twice daily Coreg , HCTZ, losartan , amlodipine  which was also added in house.    She had renal artery duplex which could not visualize left renal artery.  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  However adrenal adenoma was noted 3 x 4.2 cm.  Follow-up MRI revealed benign adenoma.  Due to her continued improvement she was discharged home on 12/29/2023.  At time of discharge she had the labs above still pending.  Of note her creatinine did creep upwards day prior to discharge and day of discharge.  We stopped her losartan  on the day of discharge and rehydrated her and her creatinine came down nicely but that afternoon and therefore we discharged her home back on her home losartan .  Of note most likely reason for her hypertensive emergency was combination of untreated hypothyroidism secondary to financial difficulties as well as possibility of undiagnosed/untreated OSA.  Recommend sleep study outpatient.  We do not have final diagnosis yet as we are still awaiting plasma metanephrines and renal: Aldosterone ratio.      Assessment & Plan: Hypertensive emergency -  Currently on combination of p.o. labetalol , Coreg , amlodipine , hydrochlorothiazide . -BP today has been better in the 140s to 160s systolic. -- Workup for secondary HTN, renal vascular US , renin / aldo, plasma metanephrine thought to have been pending thus far but looks like it was never drawn.  Reordered today..  - CTA abd performed after renal ultrasound unable to appreciate left renal  artery.  CT revealed severe aortic atherosclerosis, dual left renal artery, adrenal adenoma.    Mural thrombus/aortic atherosclerosis:  - noted on CTA abdomen. -Discussed with Dr. Pearline vascular surgery, who actually thinks this is just severe atherosclerosis rather than actual mural thrombus.  No need to treat with anticoagulation per vascular. - He is more concerned about possibility of abdominal aortic aneurysm.  Plan is to set her up for duplex in 2 years to see if it has grown at all. - continue to encourage smoking cessation, continue statin, continue blood pressure control.   Smoking cessation  Current smoker at 3 cigarettes per day  -- Counseled on smoking cessation, declines NRT initially but then started   Incidental findings:  Likely remote infarct R ventricular nucleus: Would defer antiPLT during marked elevation in BP, but should consider after discharge.   DM type 2: A1c last month 6.3%. On Metformin  outpatient. Not currently requiring SSI    HLD: continue home Rosuvastatin     Hypothyroidism:  -  Has been unable to afford her home medications due to financial constraints. - TSH elevated to 15.   - Plan is to continue home levothyroxine  at current dose rather than increase the dose because she is simply not been taking it on a regular basis and this is likely leading to the abnormal TSH.   - Will need to have outpatient recheck in 6 weeks - She has declined financial assistance in the past-->consulted TOC for further rec's here   Mood d/o:  - Continue home Xanax  1 mg TID.  She tells me today this is PRN, will switch as such here   Body mass index is 36.13 kg/m. Obesity class I, would benefit from weight loss outpatient   Financial difficulties: - will consult TOC for any further recommendations - Will also attempt to discharge her with her home medications from the hospital so that she will have some at home until she can see her PCP. - Discharged her home on 30  days worth of all of her medications        Consultants: None Procedures performed: None Disposition: Home Diet recommendation:  Discharge Diet Orders (From admission, onward)     Start     Ordered   12/29/23 0000  Diet - low sodium heart healthy        12/29/23 1639           Heart healthy diet DISCHARGE MEDICATION: Allergies as of 12/29/2023   No Known Allergies      Medication List     STOP taking these medications    metoprolol  tartrate 100 MG tablet Commonly known as: LOPRESSOR        TAKE these medications    acetaminophen  325 MG tablet Commonly known as: TYLENOL  Take 650 mg by mouth every 6 (six) hours as needed for mild pain (pain score 1-3).   ALPRAZolam  1 MG tablet Commonly known as: XANAX  TAKE 1 TABLET BY MOUTH THREE TIMES DAILY AS NEEDED FOR ANXIETY   amLODipine  5 MG tablet Commonly known as: NORVASC  Take 1 tablet (5 mg total) by mouth daily. Start taking on:  December 30, 2023   carvedilol  25 MG tablet Commonly known as: COREG  Take 1 tablet (25 mg total) by mouth 2 (two) times daily with a meal.   hydrochlorothiazide  25 MG tablet Commonly known as: HYDRODIURIL  Take 1 tablet by mouth once daily   levothyroxine  175 MCG tablet Commonly known as: SYNTHROID  Take 1 tablet (175 mcg total) by mouth daily at 6 (six) AM. Start taking on: December 30, 2023 What changed: when to take this   losartan  100 MG tablet Commonly known as: COZAAR  Take 1 tablet (100 mg total) by mouth daily.   metFORMIN  500 MG tablet Commonly known as: GLUCOPHAGE  TAKE 2 TABLETS BY MOUTH TWICE DAILY WITH A MEAL   rosuvastatin  20 MG tablet Commonly known as: CRESTOR  Take 1 tablet (20 mg total) by mouth daily. What changed: when to take this   Vitamin D  (Ergocalciferol ) 1.25 MG (50000 UNIT) Caps capsule Commonly known as: DRISDOL  Take 1 capsule by mouth once a week        Follow-up Information     [facility] Follow up.   Specialty: Home Health  Services Why: Registered Nurse-office to call with visit times. Contact information: [address] [place] [address] [phone]                Discharge Exam: Fredricka Weights   12/26/23 0110  Weight: 83.9 kg   Gen: 68 y.o. female in no apparent distress.  Nontoxic Pulm: Non-labored breathing.  Clear to auscultation bilaterally.  CV: Regular rate and rhythm.  GI: Abdomen soft, non-tender, non-distended, with normoactive bowel sounds. No organomegaly or masses felt. Ext: Warm, no deformities, trace bilateral pedal edema Skin: No rashes, lesions  Neuro: Alert and oriented. No focal neurological deficits. Psych: Calm  Judgement and insight appear normal. Mood & affect appropriate.      Condition at discharge: good  The results of significant diagnostics from this hospitalization (including imaging, microbiology, ancillary and laboratory) are listed below for reference.   Imaging Studies: MR ABDOMEN W WO CONTRAST Result Date: 12/28/2023 CLINICAL DATA:  Left adrenal mass on recent CT. EXAM: MRI ABDOMEN WITHOUT AND WITH CONTRAST TECHNIQUE: Multiplanar multisequence MR imaging of the abdomen was performed both before and after the administration of intravenous contrast. CONTRAST:  8.5mL GADAVIST  GADOBUTROL  1 MMOL/ML IV SOLN COMPARISON:  CT on 12/27/2023 FINDINGS: Lower chest: No acute findings. Hepatobiliary: No hepatic masses identified. Small gallstones are seen, without signs of cholecystitis or biliary ductal dilatation. Pancreas:  No mass or inflammatory changes. Spleen:  Within normal limits in size and appearance. Adrenals/Urinary Tract: A 4.0 x 2.9 cm left adrenal mass is seen which shows homogeneous T2 hypointensity and marked signal dropout on chemical shift imaging, consistent with a benign adenoma. No right adrenal mass. Several tiny benign renal cysts are noted bilaterally (No followup imaging is recommended). No suspicious renal masses identified. No evidence  of hydronephrosis. Stomach/Bowel: Small hiatal hernia.  Otherwise unremarkable. Vascular/Lymphatic: No pathologically enlarged lymph nodes identified. No acute vascular findings. Other:  None. Musculoskeletal:  No suspicious bone lesions identified. IMPRESSION: 4 cm benign left adrenal adenoma (no followup imaging is recommended). Cholelithiasis. No radiographic evidence of cholecystitis or biliary ductal dilatation. Small hiatal hernia. Electronically Signed   By: Norleen DELENA Kil M.D.   On: 12/28/2023 17:55   CT ANGIO ABDOMEN W &/OR WO CONTRAST Result Date: 12/28/2023 CLINICAL DATA:  Severe hypertension. Renal artery duplex ultrasound demonstrated normal patency of a single right renal artery. Left renal artery could  not be identified. EXAM: CT ANGIOGRAPHY ABDOMEN TECHNIQUE: Multidetector CT imaging of the abdomen was performed using the standard protocol during bolus administration of intravenous contrast. Multiplanar reconstructed images and MIPs were obtained and reviewed to evaluate the vascular anatomy. RADIATION DOSE REDUCTION: This exam was performed according to the departmental dose-optimization program which includes automated exposure control, adjustment of the mA and/or kV according to patient size and/or use of iterative reconstruction technique. CONTRAST:  OMNIPAQUE  IOHEXOL  350 MG/ML SOLN COMPARISON:  Report from a renal artery duplex ultrasound on 12/26/2023 FINDINGS: VASCULAR Aorta: Severe atherosclerosis of the abdominal aorta with irregular mural thrombus and ulcerated plaque present predominantly along the posterior wall of the mid abdominal aorta. 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 Plaque causes mild stenosis of the aortic lumen just below the SMA origin and at the level of the main renal artery origins. Celiac: Origin stenosis of approximately 50-60%  with some degree of poststenotic dilatation. Branch vessels are normally patent and demonstrate normal branching anatomy. SMA: Smoothly tapered stenosis of the origin and proximal trunk by noncalcified plaque reaching maximal caliber of approximately 60%. Renals: Single right renal artery demonstrates normal patency. There are 2 separate left renal arteries. The superior and dominant left renal artery demonstrates mild proximal atherosclerosis without significant stenosis and maximal narrowing of approximately 30-40%. The accessory lower pole renal artery is a small-vessel of only 2 mm in caliber and demonstrates some calcified and noncalcified plaque at its origin. Estimation of degree of stenosis is very difficult due to small caliber but this does not appear critical. IMA: Patent IMA. Inflow: Atherosclerosis at both common iliac artery origins. Veins: Venous phase imaging demonstrates normally patent venous structures in the abdomen. Review of the MIP images confirms the above findings. NON-VASCULAR Lower chest: Mild scarring at both posterior lung bases. Hepatobiliary: Normal appearance of the liver without lesion or biliary dilatation. The gallbladder contains 1 large and possibly other smaller partially calcified gallstones. No gallbladder distension or biliary ductal dilatation. Pancreas: Unremarkable. No pancreatic ductal dilatation or surrounding inflammatory changes. Spleen: No splenic injury or perisplenic hematoma. Adrenals/Urinary Tract: Large left adrenal mass measures up to 3.2 x 4.8 cm and demonstrates internal density of approximately 17-21 Hounsfield units on the arterial phase and 53-55 Hounsfield units on the venous phase. Although most likely an adenoma based on fairly low internal density on arterial phase, this is not fully characterized and given severe hypertension, pheochromocytoma is not completely excluded. Recommend formal evaluation with MRI of the abdomen with and without contrast with  adrenal protocol. Kidneys demonstrate some small bilateral simple cysts. No hydronephrosis or solid renal masses. Stomach/Bowel: Visualized bowel in the abdomen demonstrates no bowel obstruction or free air. Moderate fecal material in the colon. Lymphatic: No enlarged lymph nodes identified. Other: No abdominal wall hernia or abnormality. No abdominopelvic ascites. Musculoskeletal: No acute or significant osseous findings. IMPRESSION: 1. Severe atherosclerosis of the abdominal aorta with irregular mural thrombus and ulcerated plaque present predominantly along the posterior wall of the mid abdominal aorta. 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 Plaque causes mild stenosis of the aortic lumen just below the SMA origin and at the level of the main renal artery origins. Recommend follow-up every 2 years. This recommendation follows ACR consensus  guidelines: White Paper of the ACR Incidental Findings Committee II on Vascular Findings. J Am Coll Radiol 2013; 10:789-794. 2. Single right renal artery demonstrates normal patency. There are 2 separate left renal arteries. The superior and dominant left renal artery demonstrates mild proximal atherosclerosis without significant stenosis and maximal narrowing of approximately 30-40%. The accessory lower pole renal artery is a small-vessel of only 2 mm in caliber and demonstrates some calcified and noncalcified plaque at its origin. Estimation of degree of stenosis is very difficult due to small caliber but this does not appear critical. 3. Large left adrenal mass measures up to 3.2 x 4.8 cm and demonstrates internal density of approximately 17-21 Hounsfield units on the arterial phase and 53-55 Hounsfield units on the venous phase. Although most likely an adenoma based on fairly low internal density on arterial phase, this is not fully  characterized and given severe hypertension, pheochromocytoma is not completely excluded. Recommend formal evaluation with MRI of the abdomen with and without contrast with adrenal protocol. 4. Cholelithiasis. Electronically Signed   By: Marcey Moan M.D.   On: 12/28/2023 09:06   VAS US  RENAL ARTERY DUPLEX Result Date: 12/26/2023 ABDOMINAL VISCERAL Patient Name:  TAMBERLYN MIDGLEY  Date of Exam:   12/26/2023 Medical Rec #: [id number]       Accession #:    [id number] Date of Birth: 06/24/1955       Patient Gender: F Patient Age:   2 years Exam Location:  [facility] Procedure:      VAS US  RENAL ARTERY DUPLEX Referring Phys: [id number] DORN DAWSON -------------------------------------------------------------------------------- Indications: Hypertension High Risk Factors: Hypertension. Limitations: Obesity, air/bowel gas and heavy breathing and coughing. Performing Technologist: Elmarie Lindau, RVT  Examination Guidelines: A complete evaluation includes B-mode imaging, spectral Doppler, color Doppler, and power Doppler as needed of all accessible portions of each vessel. Bilateral testing is considered an integral part of a complete examination. Limited examinations for reoccurring indications may be performed as noted.  Duplex Findings: +----------+--------+--------+------+--------+ MesentericPSV cm/sEDV cm/sPlaqueComments +----------+--------+--------+------+--------+ Aorta Prox   50                          +----------+--------+--------+------+--------+    +------------------+--------+--------+-------+ Right Renal ArteryPSV cm/sEDV cm/sComment +------------------+--------+--------+-------+ Origin               85      22           +------------------+--------+--------+-------+ Proximal            174      27           +------------------+--------+--------+-------+ Mid                 164      38           +------------------+--------+--------+-------+ Distal               123      22           +------------------+--------+--------+-------+ +-----------------+--------+--------+--------+ Left Renal ArteryPSV cm/sEDV cm/sComment  +-----------------+--------+--------+--------+ Origin                           not seen +-----------------+--------+--------+--------+ Proximal                         not seen +-----------------+--------+--------+--------+ Mid  not seen +-----------------+--------+--------+--------+ Distal             102      23            +-----------------+--------+--------+--------+  Technologist observations: Patent right renal vein Patent left renal vein +------------+--------+--------+----+-----------+--------+--------+----+ Right KidneyPSV cm/sEDV cm/sRI  Left KidneyPSV cm/sEDV cm/sRI   +------------+--------+--------+----+-----------+--------+--------+----+ Upper Pole  20      7       0.65Upper Pole 16      5       0.69 +------------+--------+--------+----+-----------+--------+--------+----+ Mid         18      7       0.                             +------------+--------+--------+----+-----------+--------+--------+----+ Lower Pole  20      6       0.70Lower Pole                      +------------+--------+--------+----+-----------+--------+--------+----+ Hilar                           Hilar                           +------------+--------+--------+----+-----------+--------+--------+----+ +------------------+-----+------------------+-----+ Right Kidney           Left Kidney             +------------------+-----+------------------+-----+ RAR                    RAR                     +------------------+-----+------------------+-----+ RAR (manual)      3.5  RAR (manual)            +------------------+-----+------------------+-----+ Cortex                 Cortex                  +------------------+-----+------------------+-----+ Cortex  thickness       Corex thickness         +------------------+-----+------------------+-----+ Kidney length (cm)11.62Kidney length (cm)11.40 +------------------+-----+------------------+-----+  Summary: Renal:  Right: Normal size right kidney. Normal right Resisitive Index. No        evidence of right renal artery stenosis. Left:  Normal size of left kidney. The left RI and stenosis is not        determined due to the inability to visualize the entire left        renal artery.  *See table(s) above for measurements and observations.  Diagnosing physician: Norman Serve  Electronically signed by Norman Serve on 12/26/2023 at 4:04:03 PM.    Final    CT HEAD WO CONTRAST ( ) Result Date: 12/26/2023 EXAM: CT HEAD WITHOUT CONTRAST 12/26/2023 04:26:20 AM TECHNIQUE: CT of the head was performed without the administration of intravenous contrast. Automated exposure control, iterative reconstruction, and/or weight based adjustment of the mA/kV was utilized to reduce the radiation dose to as low as reasonably achievable. COMPARISON: 10/31/2003 CLINICAL HISTORY: Headache, increasing frequency or severity, hypertension. FINDINGS: BRAIN AND VENTRICLES: No acute hemorrhage. Gray-white differentiation is preserved. No hydrocephalus. No extra-axial collection. No mass effect or midline shift. Periventricular and scattered subcortical white matter hypoattenuation is moderately advanced for age. A nonhemorrhagic lacunar infarct in the right ventricular nucleus is  age indeterminate but likely remote. ORBITS: No acute abnormality. SINUSES: No acute abnormality. SOFT TISSUES AND SKULL: No acute soft tissue abnormality. No skull fracture. VASCULATURE: Atherosclerotic calcifications are present in the cavernous carotid arteries bilaterally and at the dural margin of both vertebral arteries. No hyperdense vessel is present. IMPRESSION: 1. No acute intracranial abnormality. 2. Moderately advanced periventricular and scattered  subcortical white matter hypoattenuation for age. 3. Age indeterminate but likely remote nonhemorrhagic lacunar infarct in the right ventricular nucleus. Electronically signed by: Lonni Necessary MD 12/26/2023 04:36 AM EDT RP Workstation: [id number]   DG Chest Port 1 View Result Date: 12/26/2023 CLINICAL DATA:  Hypertension and dizziness. EXAM: PORTABLE CHEST 1 VIEW COMPARISON:  August 27, 2020 FINDINGS: The heart size and mediastinal contours are within normal limits. Both lungs are clear. Multilevel degenerative changes are seen throughout the thoracic spine. IMPRESSION: No active disease. Electronically Signed   By: Suzen Dials M.D.   On: 12/26/2023 01:54    Microbiology: Results for orders placed or performed in visit on 07/11/15  Urine culture     Status: None   Collection Time: 07/11/15 10:17 AM  Result Value Ref Range Status   Colony Count NO GROWTH  Final   Organism ID, Bacteria NO GROWTH  Final    Labs: CBC: Recent Labs  Lab 12/26/23 0140 12/27/23 0247 12/28/23 0504 12/29/23 0511  WBC 6.7 5.6 5.0 5.5  HGB 12.3 11.8* 11.3* 11.1*  HCT 39.0 37.0 35.3* 34.5*  MCV 88.4 85.3 85.7 87.1  PLT 324 287 258 255   Basic Metabolic Panel: Recent Labs  Lab 12/26/23 0140 12/27/23 0247 12/28/23 0504 12/29/23 0511 12/29/23 1450  NA 141 136 136 137 137  K 4.0 4.0 3.8 4.0 3.9  CL 105 103 99 103 101  CO2 24 24 24 23 22   GLUCOSE 121* 121* 119* 119* 177*  BUN 12 16 22  33* 31*  CREATININE 1.00 1.18* 1.38* 1.52* 1.28*  CALCIUM  8.7* 8.4* 8.7* 8.3* 8.1*  MG  --   --   --  2.2  --    Liver Function Tests: Recent Labs  Lab 12/27/23 0247  AST 11*  ALT 11  ALKPHOS 94  BILITOT 0.7  PROT 6.3*  ALBUMIN 3.2*   CBG: Recent Labs  Lab 12/29/23 0748  GLUCAP 117*    Discharge time spent: less than 30 minutes.  Signed: Reyes VEAR Gaw, MD [facility] 12/29/2023

## 2023-12-30 ENCOUNTER — Other Ambulatory Visit: Payer: Self-pay | Admitting: Family Medicine

## 2023-12-30 ENCOUNTER — Telehealth: Payer: Self-pay | Admitting: Family Medicine

## 2023-12-30 ENCOUNTER — Telehealth: Payer: Self-pay

## 2023-12-30 DIAGNOSIS — I160 Hypertensive urgency: Secondary | ICD-10-CM | POA: Diagnosis not present

## 2023-12-30 DIAGNOSIS — I119 Hypertensive heart disease without heart failure: Secondary | ICD-10-CM | POA: Diagnosis not present

## 2023-12-30 DIAGNOSIS — E1169 Type 2 diabetes mellitus with other specified complication: Secondary | ICD-10-CM

## 2023-12-30 DIAGNOSIS — R2689 Other abnormalities of gait and mobility: Secondary | ICD-10-CM

## 2023-12-30 MED ORDER — VITAMIN D (ERGOCALCIFEROL) 1.25 MG (50000 UNIT) PO CAPS: 50000.0000 [IU] | ORAL_CAPSULE | ORAL | 0 refills | Status: DC

## 2023-12-30 NOTE — Telephone Encounter (Signed)
 Copied from CRM [id number]. Topic: Clinical - Home Health Verbal Orders >> Dec 30, 2023  2:38 PM Deaijah H wrote: Caller/Agency: April Nurse w/ [facility] Callback Number: [phone] Service Requested: Skilled Nursing Frequency: 2x a week for 4wk /  Any new concerns about the patient? No

## 2023-12-30 NOTE — Transitions of Care (Post Inpatient/ED Visit) (Signed)
   12/30/2023  Name: Virginia Romero MRN: [id number] DOB: 1955-09-23  Today's TOC FU Call Status: Today's TOC FU Call Status:: Successful TOC FU Call Completed TOC FU Call Complete Date: 12/30/23 (Spoke with patient who states she only wants fae to face services and has spoken with Hedda who will be coming to see her. Declined TOC calls/program - TOC RN told patient she can call back if she changes her mind) Patient's Name and Date of Birth confirmed.  Transition Care Management Follow-up Telephone Call Date of Discharge: 12/29/23 Discharge Facility: [facility]) Type of Discharge: Inpatient Admission Primary Inpatient Discharge Diagnosis:: Hypertensive emergency  Shona Prow RN, CCM [place]  VBCI-Population Health RN Care Manager [phone]

## 2023-12-30 NOTE — Telephone Encounter (Signed)
 Last office visit 11/27/2023 for DM.  Last refilled 07/09/2023 for #12 with no refills.  Vit D level 04/10/2023 which was normal at 36.93 ng/mL.  Next Appt:

## 2023-12-30 NOTE — Telephone Encounter (Signed)
 Copied from CRM [id number]. Topic: Clinical - Medication Refill >> Dec 30, 2023  2:40 PM Deaijah H wrote: Medication: hydrochlorothiazide  (HYDRODIURIL ) 25 MG tablet/Vitamin D , Ergocalciferol , (DRISDOL ) 1.25 MG (50000 UNIT) CAPS capsule  Has the patient contacted their pharmacy? No (Agent: If no, request that the patient contact the pharmacy for the refill. If patient does not wish to contact the pharmacy document the reason why and proceed with request.) (Agent: If yes, when and what did the pharmacy advise?)  This is the patient's preferred pharmacy:  [facility] [address] [place] - [address] [place] [address] Phone: [phone] Fax: [phone]  Is this the correct pharmacy for this prescription? Yes If no, delete pharmacy and type the correct one.   Has the prescription been filled recently? No  Is the patient out of the medication? Yes  Has the patient been seen for an appointment in the last year OR does the patient have an upcoming appointment? Yes  Can we respond through MyChart? No  Agent: Please be advised that Rx refills may take up to 3 business days. We ask that you follow-up with your pharmacy.

## 2023-12-30 NOTE — Telephone Encounter (Unsigned)
 Copied from CRM #[id number]. Topic: Clinical - Order For Equipment >> Dec 30, 2023  2:42 PM Deaijah H wrote: Reason for CRM: April Nurse w/ [facility] would like to know if Dr. Avelina could put an Order in for shower care and glucometer

## 2023-12-31 MED ORDER — ACCU-CHEK GUIDE W/DEVICE KIT: PACK | 0 refills | Status: AC

## 2023-12-31 MED ORDER — ACCU-CHEK GUIDE CONTROL VI LIQD: 3 refills | Status: AC

## 2023-12-31 MED ORDER — ACCU-CHEK SOFTCLIX LANCETS MISC: 3 refills | Status: AC

## 2023-12-31 MED ORDER — ACCU-CHEK GUIDE TEST VI STRP: ORAL_STRIP | 3 refills | Status: AC

## 2023-12-31 NOTE — Telephone Encounter (Signed)
 Prescription for diabetic testing supplies sent to [facility].  DME order sent to [facility] for shower chair.  Community message sent to advise [facility] of order in Epic.

## 2023-12-31 NOTE — Addendum Note (Signed)
 Addended by: WENDELL ARLAND RAMAN on: 12/31/2023 09:16 AM   Modules accepted: Orders

## 2023-12-31 NOTE — Telephone Encounter (Signed)
 Left message for Virginia Romero with [facility] giving her verbal orders for Skilled Nursing  2 x week for 4 weeks.  I also let her know orders have been placed for diabetes testing supplies and shower chair as requested.

## 2024-01-02 ENCOUNTER — Telehealth: Payer: Self-pay

## 2024-01-02 DIAGNOSIS — I160 Hypertensive urgency: Secondary | ICD-10-CM | POA: Diagnosis not present

## 2024-01-02 DIAGNOSIS — I119 Hypertensive heart disease without heart failure: Secondary | ICD-10-CM | POA: Diagnosis not present

## 2024-01-02 LAB — METANEPHRINES, PLASMA
Metanephrine, Free: <25 pg/mL (ref 0.0–88.0)
Normetanephrine, Free: 108 pg/mL (ref 0.0–285.2)

## 2024-01-02 NOTE — Telephone Encounter (Signed)
 Copied from CRM [id number]. Topic: Clinical - Home Health Verbal Orders >> Jan 02, 2024  9:36 AM [name] wrote: Caller/Agency: April /  [facility] Callback Number: [phone] Service Requested: Administrator, Civil Service for finances. Frequency: N/A Any new concerns about the patient? Yes  Dr. Avelina please let us  know if you approve adding SW services.

## 2024-01-02 NOTE — Telephone Encounter (Signed)
 Verbal order given to April via telephone for Social Worker Consult for finances per Dr. Avelina.

## 2024-01-03 LAB — ALDOSTERONE + RENIN ACTIVITY W/ RATIO
ALDO / PRA Ratio: >25.7 (ref 0.0–30.0)
ALDO / PRA Ratio: 5.4 (ref 0.0–30.0)
Aldosterone: 4.3 ng/dL (ref 0.0–30.0)
Aldosterone: 8.3 ng/dL (ref 0.0–30.0)
PRA LC/MS/MS: <0.167 ng/mL/h — ABNORMAL LOW (ref 0.167–5.380)
PRA LC/MS/MS: 1.524 ng/mL/h (ref 0.167–5.380)

## 2024-01-06 ENCOUNTER — Other Ambulatory Visit: Payer: Self-pay | Admitting: Family Medicine

## 2024-01-06 DIAGNOSIS — F5104 Psychophysiologic insomnia: Secondary | ICD-10-CM

## 2024-01-06 DIAGNOSIS — M47816 Spondylosis without myelopathy or radiculopathy, lumbar region: Secondary | ICD-10-CM | POA: Diagnosis not present

## 2024-01-06 DIAGNOSIS — I119 Hypertensive heart disease without heart failure: Secondary | ICD-10-CM | POA: Diagnosis not present

## 2024-01-06 DIAGNOSIS — M47817 Spondylosis without myelopathy or radiculopathy, lumbosacral region: Secondary | ICD-10-CM | POA: Diagnosis not present

## 2024-01-06 DIAGNOSIS — D3502 Benign neoplasm of left adrenal gland: Secondary | ICD-10-CM | POA: Diagnosis not present

## 2024-01-06 DIAGNOSIS — F4310 Post-traumatic stress disorder, unspecified: Secondary | ICD-10-CM | POA: Diagnosis not present

## 2024-01-06 DIAGNOSIS — M170 Bilateral primary osteoarthritis of knee: Secondary | ICD-10-CM | POA: Diagnosis not present

## 2024-01-06 DIAGNOSIS — I701 Atherosclerosis of renal artery: Secondary | ICD-10-CM | POA: Diagnosis not present

## 2024-01-06 DIAGNOSIS — F419 Anxiety disorder, unspecified: Secondary | ICD-10-CM | POA: Diagnosis not present

## 2024-01-06 DIAGNOSIS — M47818 Spondylosis without myelopathy or radiculopathy, sacral and sacrococcygeal region: Secondary | ICD-10-CM | POA: Diagnosis not present

## 2024-01-06 DIAGNOSIS — F32A Depression, unspecified: Secondary | ICD-10-CM | POA: Diagnosis not present

## 2024-01-06 DIAGNOSIS — E119 Type 2 diabetes mellitus without complications: Secondary | ICD-10-CM | POA: Diagnosis not present

## 2024-01-06 DIAGNOSIS — I160 Hypertensive urgency: Secondary | ICD-10-CM | POA: Diagnosis not present

## 2024-01-07 NOTE — Telephone Encounter (Signed)
 Left message for Ms. Blackwelder that her nurse from [facility] (April) had called the office asking Dr. Avelina to send a Rx for diabetic meter/supplies so that is why this was sent to her pharmacy.  I ask that she call us  back if she has any further questions.

## 2024-01-07 NOTE — Telephone Encounter (Signed)
 Last office visit 11/27/2023 for DM.  Last refilled 12/09/23 for #90 with no refills. Next appt: 02/27/24 for BP check.

## 2024-01-07 NOTE — Telephone Encounter (Unsigned)
 Copied from CRM [id number]. Topic: Clinical - Medical Advice >> Jan 07, 2024  2:21 PM [name] wrote: Reason for CRM: Patient was wondering why all of the supplies to check her blood sugar was sent into the pharmacy. She was wondering if it was necessary to have since she has an at home nurse that comes 2-3x weekly to check on her. ----------------------------------------------------------------------- From previous Reason for Contact - Other: Reason for CRM:

## 2024-01-09 ENCOUNTER — Ambulatory Visit: Payer: Self-pay | Admitting: *Deleted

## 2024-01-09 DIAGNOSIS — I160 Hypertensive urgency: Secondary | ICD-10-CM | POA: Diagnosis not present

## 2024-01-09 DIAGNOSIS — I119 Hypertensive heart disease without heart failure: Secondary | ICD-10-CM | POA: Diagnosis not present

## 2024-01-09 NOTE — Telephone Encounter (Signed)
Noted. Will discuss at OV. 

## 2024-01-09 NOTE — Telephone Encounter (Signed)
 FYI Only or Action Required?: FYI only for provider.  Patient was last seen in primary care on 11/27/2023 by Virginia Greig BRAVO, MD.  Called Nurse Triage reporting Anxiety (Anxiety/depression).  Symptoms began several weeks ago.  Interventions attempted: Prescription medications: xanax .  Symptoms are: gradually worsening.  Triage Disposition: See Physician Within 24 Hours  Patient/caregiver understands and will follow disposition?: -yes-  patient scheduled out of disposition    Reason for Disposition  [1] Depression AND [2] getting worse (e.g., sleeping poorly, less able to do activities of daily living)  Answer Assessment - Initial Assessment Questions Home health Care : April, RN calling to report patient anxiety may be causing increased BP episodes. Patient BP range- 140/120 systolic, 80/60-diastolic - patient has had some spiking- 216/92(several days ago), 173/87(today- after BP meds 140/68). Patient is concerned that since she stopped her Cymbalta - 4 weeks ago- she has had increased anxiety/depression- she has also had recent health change which has caused increased anxiety as well. She is requesting sooner appointment to discuss anxiety and depression. Patient states she is unable to come to office- due to transportation and financial stress- she has to hire taxi. Patient has been scheduled for first available appointment with her provider of Tuesday, 8/12- requested as virtual- she does not have MyChart(declines) and would like to do virtual on her smart phone. [facility] Patient is aware to call office with changes. Message to be sent for provider review     1. CONCERN: What happened that made you call today?     Anxiety/depression- patient has stopped Cymbalta , she is only using xanax  2. DEPRESSION SYMPTOM SCREENING: How are you feeling overall? (e.g., decreased energy, increased sleeping or difficulty sleeping, difficulty  concentrating, feelings of sadness, guilt, hopelessness, or worthlessness)     Anxious over recent BP diagnosis 3. RISK OF HARM - SUICIDAL IDEATION:  Do you ever have thoughts of hurting or killing yourself?  (e.g., yes, no, no but preoccupation with thoughts about death)     No- no plans 4. RISK OF HARM - HOMICIDAL IDEATION:  Do you ever have thoughts of hurting or killing someone else?  (e.g., yes, no, no but preoccupation with thoughts about death)     no 5. FUNCTIONAL IMPAIRMENT: How have things been going for you overall? Have you had more difficulty than usual doing your normal daily activities?  (e.g., better, same, worse; self-care, school, work, interactions)     Recent hospitalization- trouble with focus, sleeping alot 6. SUPPORT: Who is with you now? Who do you live with? Do you have family or friends who you can talk to?      Patient lives by herself- has close neighbor/friends, family support 7. THERAPIST: Do you have a counselor or therapist? If Yes, ask: What is their name?     No- patient has seen therapist in the past 8. STRESSORS: Has there been any new stress or recent changes in your life?     Recent health scare, other life stressors- recent death of family member  Protocols used: Depression-A-AH     Copied from CRM [id number]. Topic: Clinical - Red Word Triage >> Jan 09, 2024 10:30 AM Mia F wrote: Red Word that prompted transfer to Nurse Triage: April nurse from [place] called in stating pt is having a lot of anxiety and depression symptoms. It is running her blood pressure up. Pt is unable to sleep well. BP was 216/94. Nurse seeing if dr  can give something for her to take daily.

## 2024-01-12 ENCOUNTER — Telehealth: Payer: Self-pay | Admitting: Family Medicine

## 2024-01-12 NOTE — Telephone Encounter (Signed)
 Copied from CRM [id number]. Topic: Clinical - Home Health Verbal Orders >> Jan 12, 2024  9:59 AM [name] wrote: Caller/Agency: Debi / [facility]  Callback Number: [phone] Service Requested: Social Worker Frequency: 1 week 2 ; 0 week 1 ; 1 week 1 Any new concerns about the patient? Yes Patient is extremely depressed.

## 2024-01-13 ENCOUNTER — Encounter: Payer: Self-pay | Admitting: Family Medicine

## 2024-01-13 ENCOUNTER — Telehealth: Admitting: Family Medicine

## 2024-01-13 VITALS — BP 114/60 | HR 62 | Temp 98.2°F | Resp 16 | Ht 61.75 in

## 2024-01-13 DIAGNOSIS — E1159 Type 2 diabetes mellitus with other circulatory complications: Secondary | ICD-10-CM

## 2024-01-13 DIAGNOSIS — E034 Atrophy of thyroid (acquired): Secondary | ICD-10-CM | POA: Diagnosis not present

## 2024-01-13 DIAGNOSIS — I700 Atherosclerosis of aorta: Secondary | ICD-10-CM | POA: Diagnosis not present

## 2024-01-13 DIAGNOSIS — I119 Hypertensive heart disease without heart failure: Secondary | ICD-10-CM | POA: Diagnosis not present

## 2024-01-13 DIAGNOSIS — F331 Major depressive disorder, recurrent, moderate: Secondary | ICD-10-CM

## 2024-01-13 DIAGNOSIS — F411 Generalized anxiety disorder: Secondary | ICD-10-CM

## 2024-01-13 DIAGNOSIS — I152 Hypertension secondary to endocrine disorders: Secondary | ICD-10-CM

## 2024-01-13 DIAGNOSIS — I160 Hypertensive urgency: Secondary | ICD-10-CM | POA: Diagnosis not present

## 2024-01-13 MED ORDER — ESCITALOPRAM OXALATE 10 MG PO TABS: 10.0000 mg | ORAL_TABLET | Freq: Every day | ORAL | 1 refills | Status: DC

## 2024-01-13 NOTE — Assessment & Plan Note (Signed)
 Chronic, with recent hypertensive urgency.  Today her blood pressure is looking wonderful on her current regimen.  She has had continued blood pressure fluctuations though that may primarily be related to anxiety and mood issues.  We will begin with addressing anxiety and depression and reevaluate in 1 month with blood pressure. She will continue to follow blood pressure at home and if she notices more numbers greater than 140/90 she will call for possible medication adjustment such as increasing amlodipine  to 10 mg daily.  Amlodipine  5 mg nightly Carvedilol  25 mg p.o. twice daily HCTZ 25 mg daily Losartan  100 mg daily

## 2024-01-13 NOTE — Assessment & Plan Note (Signed)
 Chronic, poor control off of medication.  Using alprazolam  1 mg daily as needed. Offered referral to psychology and psychiatry but patient is not interested. She has a long history of mood disorder and has been on many medication.  We will try Lexapro  10 mg p.o. nightly with close follow-up in 4 weeks.  Return and ER precautions provided

## 2024-01-13 NOTE — Assessment & Plan Note (Signed)
 Chronic, 1 month ago TSH, free T3 and free T4 were in the normal range but here in the hospital 2 weeks ago TSH was elevated.  If this is inadequately controlled this could be contributing to her blood pressure issues and mood issues. She states she has been compliant throughout with her levothyroxine  175 mcg daily.  She will come in for repeat TSH, free T3 and free T4 when she is able in the next few weeks.

## 2024-01-13 NOTE — Progress Notes (Signed)
 Patient ID: Virginia Romero, female    DOB: [birth year], 68 y.o.   MRN: [id number]  This visit was conducted in person.  BP 114/60   Pulse 62   Temp 98.2 F (36.8 C) (Oral)   Resp 16   Ht 5' 1.75 (1.568 m)   SpO2 96%   BMI 34.11 kg/m    CC:  Chief Complaint  Patient presents with   Depression    States Cymbalta  didn't work.  Wants something else prescribed    Subjective:   HPI: Virginia Romero is a 68 y.o. female presenting on 01/13/2024 for Depression (States Cymbalta  didn't work.  Wants something else prescribed)  VIRTUAL VISIT A virtual visit is felt to be most appropriate for this patient at this time.   I connected with the patient on 01/13/24 at  9:00 AM EDT by virtual telehealth platform and verified that I am speaking with the correct person using two identifiers.   I discussed the limitations, risks, security and privacy concerns of performing an evaluation and management service by  virtual telehealth platform and the availability of in person appointments. I also discussed with the patient that there may be a patient responsible charge related to this service. The patient expressed understanding and agreed to proceed.  Patient location: Home Provider Location: [facility] Participants: Greig Ring and Devere JONELLE [place]   Chief Complaint  Patient presents with   Depression    States Cymbalta  didn't work.  Wants something else prescribed    History of Present Illness:  68 y.o. female patient of Khali Perella E, MD presents  for mood and hospital follow up.  MDD, GAD , chronic insomnia:   Has been on Cymbalta  in past but this was no longer effective for pt even up[ to 90 mg daily.  Using alprazolam  1 mg TID.  Sleeping 13 hours at night.     01/13/2024    9:00 AM 11/27/2023   10:52 AM 04/10/2023   11:23 AM 11/28/2022   11:05 AM  GAD 7 : Generalized Anxiety Score  Nervous, Anxious, on Edge 3 1 3 2   Control/stop worrying 3 2 3 2   Worry too much -  different things 3 2 3 2   Trouble relaxing 3 2 3 2   Restless 0 2 2 1   Easily annoyed or irritable 0 2 2 1   Afraid - awful might happen 3 2 2 2   Total GAD 7 Score 15 13 18 12   Anxiety Difficulty Not difficult at all Not difficult at all Somewhat difficult Somewhat difficult      01/13/2024    8:58 AM 11/27/2023   10:51 AM 04/10/2023   11:22 AM  Depression screen PHQ 2/9  Decreased Interest 3 1 3   Down, Depressed, Hopeless 3 1 3   PHQ - 2 Score 6 2 6   Altered sleeping 0 2 3  Tired, decreased energy 3 1 3   Change in appetite 1 2 3   Feeling bad or failure about yourself  3 1 3   Trouble concentrating 0 2 2  Moving slowly or fidgety/restless 0 2 2  Suicidal thoughts 0 1 2  PHQ-9 Score 13 13 24   Difficult doing work/chores Not difficult at all Not difficult at all Somewhat difficult      At last OV  she reported financial issues.    Recent hospital  visit on 12/25/23 for hypertensive urgency.  Discharged: 12/29/2023  Hospital summary: Day of admission she was feeling weird', clarifies that she  was feeling very anxious and emotionally elevated. Did report fleeting episode of chest pain, which is not as bad as what I get when I'm out doing yardwork.  Brought to ER for the same and en route EMS reported systolic blood pressure greater than 300.  In ER it was 260.  She was started on IV labetalol  and hydralazine  in the emergency department.  She was also restarted on her home hydrochlorothiazide  and losartan .  Carvedilol  was substituted for metoprolol  at 25 mg p.o. twice daily to help with a little bit better blood pressure control.     Restarted her home Synthroid .  On day 2 her blood pressure began to come down nicely.  This was controlled with a combination of twice daily Coreg , HCTZ, losartan , amlodipine  which was also added in house.     She had renal artery duplex which could not visualize left renal artery.  CTA of abdomen was therefore ordered and this revealed duel renal arteries to  the left kidney without any evidence of stenosis.  However adrenal adenoma was noted 3 x 4.2 cm.  Follow-up MRI revealed benign adenoma. discharged home on amlodipine  5 mg nightly, carvedilol  25 mg p.o. twice daily, HCTZ 25 mg daily, losartan  100 mg daily.     She has been following BP at home.. 140-170/?, she is complaint with the medication.   COVID 19 screen No recent travel or known exposure to COVID19 The patient denies respiratory symptoms of COVID 19 at this time.  The importance of social distancing was discussed today.   Review of Systems  Constitutional:  Negative for fatigue and fever.  HENT:  Negative for congestion.   Eyes:  Negative for pain.  Respiratory:  Negative for cough and shortness of breath.   Cardiovascular:  Negative for chest pain, palpitations and leg swelling.  Gastrointestinal:  Negative for abdominal pain.  Genitourinary:  Negative for dysuria and vaginal bleeding.  Musculoskeletal:  Negative for back pain.  Neurological:  Negative for syncope, light-headedness and headaches.  Psychiatric/Behavioral:  Positive for dysphoric mood. The patient is nervous/anxious.       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 She has a 15 pack-year smoking history. She has never used smokeless tobacco. She reports that she does not drink alcohol and does not use drugs.   Current Outpatient  Medications:    Accu-Chek Softclix Lancets lancets, Use to check blood sugar daily, Disp: 100 each, Rfl: 3   acetaminophen  (TYLENOL ) 325 MG tablet, Take 650 mg by mouth every 6 (six) hours as needed for mild pain (pain score 1-3)., Disp: , Rfl:    ALPRAZolam  (XANAX ) 1 MG tablet, TAKE 1 TABLET BY MOUTH THREE TIMES DAILY AS NEEDED FOR ANXIETY, Disp: 90 tablet, Rfl: 0  amLODipine  (NORVASC ) 5 MG tablet, Take 1 tablet (5 mg total) by mouth daily., Disp: 30 tablet, Rfl: 0   Blood Glucose Calibration (ACCU-CHEK GUIDE CONTROL) LIQD, Use to check controls on glucose meter, Disp: 1 each, Rfl: 3   Blood Glucose Monitoring Suppl (ACCU-CHEK GUIDE) w/Device KIT, Use to check blood sugar daily, Disp: 1 kit, Rfl: 0   carvedilol  (COREG ) 25 MG tablet, Take 1 tablet (25 mg total) by mouth 2 (two) times daily with a meal., Disp: 60 tablet, Rfl: 0   escitalopram  (LEXAPRO ) 10 MG tablet, Take 1 tablet (10 mg total) by mouth at bedtime., Disp: 30 tablet, Rfl: 1   glucose blood (ACCU-CHEK GUIDE TEST) test strip, Use to check blood sugar daily, Disp: 100 each, Rfl: 3   hydrochlorothiazide  (HYDRODIURIL ) 25 MG tablet, Take 1 tablet by mouth once daily, Disp: 90 tablet, Rfl: 1   levothyroxine  (SYNTHROID ) 175 MCG tablet, Take 1 tablet (175 mcg total) by mouth daily at 6 (six) AM., Disp: 30 tablet, Rfl: 0   losartan  (COZAAR ) 100 MG tablet, Take 1 tablet (100 mg total) by mouth daily., Disp: 30 tablet, Rfl: 0   metFORMIN  (GLUCOPHAGE ) 500 MG tablet, TAKE 2 TABLETS BY MOUTH TWICE DAILY WITH A MEAL, Disp: 120 tablet, Rfl: 5   rosuvastatin  (CRESTOR ) 20 MG tablet, Take 1 tablet (20 mg total) by mouth daily., Disp: 90 tablet, Rfl: 3   Vitamin D , Ergocalciferol , (DRISDOL ) 1.25 MG (50000 UNIT) CAPS capsule, Take 1 capsule (50,000 Units total) by mouth once a week., Disp: 12 capsule, Rfl: 0   Observations/Objective: Blood pressure 114/60, pulse 62, temperature 98.2 F (36.8 C), temperature source Oral, resp. rate 16, height 5' 1.75  (1.568 m), SpO2 96%.  Physical Exam Constitutional:      General: The patient is not in acute distress. Pulmonary:     Effort: Pulmonary effort is normal. No respiratory distress.  Neurological:     Mental Status: The patient is alert and oriented to person, place, and time.  Psychiatric:        Mood and Affect: Mood normal.        Behavior: Behavior normal.    Assessment and Plan GAD (generalized anxiety disorder)  MDD (major depressive disorder), recurrent episode, moderate (HCC) Assessment & Plan: Chronic, poor control off of medication.  Using alprazolam  1 mg daily as needed. Offered referral to psychology and psychiatry but patient is not interested. She has a long history of mood disorder and has been on many medication.  We will try Lexapro  10 mg p.o. nightly with close follow-up in 4 weeks.  Return and ER precautions provided   Hypertension associated with diabetes [facility]) Assessment & Plan: Chronic, with recent hypertensive urgency.  Today her blood pressure is looking wonderful on her current regimen.  She has had continued blood pressure fluctuations though that may primarily be related to anxiety and mood issues.  We will begin with addressing anxiety and depression and reevaluate in 1 month with blood pressure. She will continue to follow blood pressure at home and if she notices more numbers greater than 140/90 she will call for possible medication adjustment such as increasing amlodipine  to 10 mg daily.  Amlodipine  5 mg nightly Carvedilol  25 mg p.o. twice daily HCTZ 25 mg daily Losartan  100 mg daily   Hypothyroidism due to acquired atrophy of thyroid  Assessment & Plan: Chronic, 1 month ago TSH, free T3 and free T4 were in the normal range but here in the hospital 2 weeks ago TSH was  elevated.  If this is inadequately controlled this could be contributing to her blood pressure issues and mood issues. She states she has been compliant throughout with her  levothyroxine  175 mcg daily.  She will come in for repeat TSH, free T3 and free T4 when she is able in the next few weeks.  Orders: -     TSH; Future -     T4, free; Future -     T3, free; Future  Atherosclerosis of aorta [facility]) Assessment & Plan: Chronic, she is on Crestor  20 mg daily.  We discussed low-cholesterol diet.   Other orders -     Escitalopram  Oxalate; Take 1 tablet (10 mg total) by mouth at bedtime.  Dispense: 30 tablet; Refill: 1      I discussed the assessment and treatment plan with the patient. The patient was provided an opportunity to ask questions and all were answered. The patient agreed with the plan and demonstrated an understanding of the instructions.   The patient was advised to call back or seek an in-person evaluation if the symptoms worsen or if the condition fails to improve as anticipated.     Greig Ring, MD   Relevant past medical, surgical, family and social history reviewed and updated as indicated. Interim medical history since our last visit reviewed. Allergies and medications reviewed and updated. Outpatient Medications Prior to Visit  Medication Sig Dispense Refill   Accu-Chek Softclix Lancets lancets Use to check blood sugar daily 100 each 3   acetaminophen  (TYLENOL ) 325 MG tablet Take 650 mg by mouth every 6 (six) hours as needed for mild pain (pain score 1-3).     ALPRAZolam  (XANAX ) 1 MG tablet TAKE 1 TABLET BY MOUTH THREE TIMES DAILY AS NEEDED FOR ANXIETY 90 tablet 0   amLODipine  (NORVASC ) 5 MG tablet Take 1 tablet (5 mg total) by mouth daily. 30 tablet 0   Blood Glucose Calibration (ACCU-CHEK GUIDE CONTROL) LIQD Use to check controls on glucose meter 1 each 3   Blood Glucose Monitoring Suppl (ACCU-CHEK GUIDE) w/Device KIT Use to check blood sugar daily 1 kit 0   carvedilol  (COREG ) 25 MG tablet Take 1 tablet (25 mg total) by mouth 2 (two) times daily with a meal. 60 tablet 0   glucose blood (ACCU-CHEK GUIDE TEST) test strip Use to check  blood sugar daily 100 each 3   hydrochlorothiazide  (HYDRODIURIL ) 25 MG tablet Take 1 tablet by mouth once daily 90 tablet 1   levothyroxine  (SYNTHROID ) 175 MCG tablet Take 1 tablet (175 mcg total) by mouth daily at 6 (six) AM. 30 tablet 0   losartan  (COZAAR ) 100 MG tablet Take 1 tablet (100 mg total) by mouth daily. 30 tablet 0   metFORMIN  (GLUCOPHAGE ) 500 MG tablet TAKE 2 TABLETS BY MOUTH TWICE DAILY WITH A MEAL 120 tablet 5   rosuvastatin  (CRESTOR ) 20 MG tablet Take 1 tablet (20 mg total) by mouth daily. 90 tablet 3   Vitamin D , Ergocalciferol , (DRISDOL ) 1.25 MG (50000 UNIT) CAPS capsule Take 1 capsule (50,000 Units total) by mouth once a week. 12 capsule 0   Metoprolol  Succinate (TOPROL  XL PO) Take by mouth.     No facility-administered medications prior to visit.     Per HPI unless specifically indicated in ROS section below Review of Systems  Constitutional:  Negative for fatigue and fever.  HENT:  Negative for congestion.   Eyes:  Negative for pain.  Respiratory:  Negative for cough and shortness of breath.   Cardiovascular:  Negative for chest pain, palpitations and leg swelling.  Gastrointestinal:  Negative for abdominal pain.  Genitourinary:  Negative for dysuria and vaginal bleeding.  Musculoskeletal:  Negative for back pain.  Neurological:  Negative for syncope, light-headedness and headaches.  Psychiatric/Behavioral:  Positive for dysphoric mood. The patient is nervous/anxious.    Objective:  BP 114/60   Pulse 62   Temp 98.2 F (36.8 C) (Oral)   Resp 16   Ht 5' 1.75 (1.568 m)   SpO2 96%   BMI 34.11 kg/m   Wt Readings from Last 3 Encounters:  12/26/23 185 lb (83.9 kg)  11/27/23 192 lb (87.1 kg)  06/18/23 189 lb 3.2 oz (85.8 kg)      Physical Exam   Physical Exam Constitutional:      General: The patient is not in acute distress. Pulmonary:     Effort: Pulmonary effort is normal. No respiratory distress.  Neurological:     Mental Status: The patient is alert  and oriented to person, place, and time.  Psychiatric:        Mood and Affect: Mood normal.        Behavior: Behavior normal.   Results for orders placed or performed during the hospital encounter of 12/26/23  Urinalysis, Routine w reflex microscopic -Urine, Clean Catch   Collection Time: 12/26/23  1:30 AM  Result Value Ref Range   Color, Urine STRAW (A) YELLOW   APPearance CLEAR CLEAR   Specific Gravity, Urine 1.005 1.005 - 1.030   pH 7.0 5.0 - 8.0   Glucose, UA NEGATIVE NEGATIVE mg/dL   Hgb urine dipstick NEGATIVE NEGATIVE   Bilirubin Urine NEGATIVE NEGATIVE   Ketones, ur NEGATIVE NEGATIVE mg/dL   Protein, ur NEGATIVE NEGATIVE mg/dL   Nitrite NEGATIVE NEGATIVE   Leukocytes,Ua NEGATIVE NEGATIVE  Rapid urine drug screen (hospital performed)   Collection Time: 12/26/23  1:30 AM  Result Value Ref Range   Opiates NONE DETECTED NONE DETECTED   Cocaine NONE DETECTED NONE DETECTED   Benzodiazepines POSITIVE (A) NONE DETECTED   Amphetamines NONE DETECTED NONE DETECTED   Tetrahydrocannabinol NONE DETECTED NONE DETECTED   Barbiturates NONE DETECTED NONE DETECTED  CBC   Collection Time: 12/26/23  1:40 AM  Result Value Ref Range   WBC 6.7 4.0 - 10.5 K/uL   RBC 4.41 3.87 - 5.11 MIL/uL   Hemoglobin 12.3 12.0 - 15.0 g/dL   HCT 60.9 63.9 - 53.9 %   MCV 88.4 80.0 - 100.0 fL   MCH 27.9 26.0 - 34.0 pg   MCHC 31.5 30.0 - 36.0 g/dL   RDW 85.3 88.4 - 84.4 %   Platelets 324 150 - 400 K/uL   nRBC 0.0 0.0 - 0.2 %  Basic metabolic panel with GFR   Collection Time: 12/26/23  1:40 AM  Result Value Ref Range   Sodium 141 135 - 145 mmol/L   Potassium 4.0 3.5 - 5.1 mmol/L   Chloride 105 98 - 111 mmol/L   CO2 24 22 - 32 mmol/L   Glucose, Bld 121 (H) 70 - 99 mg/dL   BUN 12 8 - 23 mg/dL   Creatinine, Ser 8.99 0.44 - 1.00 mg/dL   Calcium  8.7 (L) 8.9 - 10.3 mg/dL   GFR, Estimated >39 >39 mL/min   Anion gap 12 5 - 15  Troponin I (High Sensitivity)   Collection Time: 12/26/23  1:40 AM  Result  Value Ref Range   Troponin I (High Sensitivity) 13 <18 ng/L  TSH   Collection  Time: 12/26/23  3:40 AM  Result Value Ref Range   TSH 14.332 (H) 0.350 - 4.500 uIU/mL  Troponin I (High Sensitivity)   Collection Time: 12/26/23  3:40 AM  Result Value Ref Range   Troponin I (High Sensitivity) 18 (H) <18 ng/L  Metanephrines, plasma   Collection Time: 12/26/23 10:14 AM  Result Value Ref Range   Normetanephrine, Free 152.8 0.0 - 285.2 pg/mL   Metanephrine, Free <25.0 0.0 - 88.0 pg/mL  Aldosterone + renin activity w/ ratio   Collection Time: 12/26/23 10:14 AM  Result Value Ref Range   PRA LC/MS/MS <0.167 (L) 0.167 - 5.380 ng/mL/hr   ALDO / PRA Ratio >25.7 0.0 - 30.0   Aldosterone 4.3 0.0 - 30.0 ng/dL  HIV Antibody (routine testing w rflx)   Collection Time: 12/27/23  2:47 AM  Result Value Ref Range   HIV Screen 4th Generation wRfx Non Reactive Non Reactive  CBC   Collection Time: 12/27/23  2:47 AM  Result Value Ref Range   WBC 5.6 4.0 - 10.5 K/uL   RBC 4.34 3.87 - 5.11 MIL/uL   Hemoglobin 11.8 (L) 12.0 - 15.0 g/dL   HCT 62.9 63.9 - 53.9 %   MCV 85.3 80.0 - 100.0 fL   MCH 27.2 26.0 - 34.0 pg   MCHC 31.9 30.0 - 36.0 g/dL   RDW 84.9 88.4 - 84.4 %   Platelets 287 150 - 400 K/uL   nRBC 0.0 0.0 - 0.2 %  Comprehensive metabolic panel with GFR   Collection Time: 12/27/23  2:47 AM  Result Value Ref Range   Sodium 136 135 - 145 mmol/L   Potassium 4.0 3.5 - 5.1 mmol/L   Chloride 103 98 - 111 mmol/L   CO2 24 22 - 32 mmol/L   Glucose, Bld 121 (H) 70 - 99 mg/dL   BUN 16 8 - 23 mg/dL   Creatinine, Ser 8.81 (H) 0.44 - 1.00 mg/dL   Calcium  8.4 (L) 8.9 - 10.3 mg/dL   Total Protein 6.3 (L) 6.5 - 8.1 g/dL   Albumin 3.2 (L) 3.5 - 5.0 g/dL   AST 11 (L) 15 - 41 U/L   ALT 11 0 - 44 U/L   Alkaline Phosphatase 94 38 - 126 U/L   Total Bilirubin 0.7 0.0 - 1.2 mg/dL   GFR, Estimated 51 (L) >60 mL/min   Anion gap 9 5 - 15  Troponin I (High Sensitivity)   Collection Time: 12/27/23  8:41 AM  Result  Value Ref Range   Troponin I (High Sensitivity) 14 <18 ng/L  Basic metabolic panel with GFR   Collection Time: 12/28/23  5:04 AM  Result Value Ref Range   Sodium 136 135 - 145 mmol/L   Potassium 3.8 3.5 - 5.1 mmol/L   Chloride 99 98 - 111 mmol/L   CO2 24 22 - 32 mmol/L   Glucose, Bld 119 (H) 70 - 99 mg/dL   BUN 22 8 - 23 mg/dL   Creatinine, Ser 8.61 (H) 0.44 - 1.00 mg/dL   Calcium  8.7 (L) 8.9 - 10.3 mg/dL   GFR, Estimated 42 (L) >60 mL/min   Anion gap 13 5 - 15  CBC   Collection Time: 12/28/23  5:04 AM  Result Value Ref Range   WBC 5.0 4.0 - 10.5 K/uL   RBC 4.12 3.87 - 5.11 MIL/uL   Hemoglobin 11.3 (L) 12.0 - 15.0 g/dL   HCT 64.6 (L) 63.9 - 53.9 %   MCV 85.7 80.0 - 100.0  fL   MCH 27.4 26.0 - 34.0 pg   MCHC 32.0 30.0 - 36.0 g/dL   RDW 84.7 88.4 - 84.4 %   Platelets 258 150 - 400 K/uL   nRBC 0.0 0.0 - 0.2 %  Aldosterone + renin activity w/ ratio   Collection Time: 12/28/23  1:17 PM  Result Value Ref Range   PRA LC/MS/MS 1.524 0.167 - 5.380 ng/mL/hr   ALDO / PRA Ratio 5.4 0.0 - 30.0   Aldosterone 8.3 0.0 - 30.0 ng/dL  Metanephrines, plasma   Collection Time: 12/28/23  1:17 PM  Result Value Ref Range   Normetanephrine, Free 108.0 0.0 - 285.2 pg/mL   Metanephrine, Free <25.0 0.0 - 88.0 pg/mL  Basic metabolic panel with GFR   Collection Time: 12/29/23  5:11 AM  Result Value Ref Range   Sodium 137 135 - 145 mmol/L   Potassium 4.0 3.5 - 5.1 mmol/L   Chloride 103 98 - 111 mmol/L   CO2 23 22 - 32 mmol/L   Glucose, Bld 119 (H) 70 - 99 mg/dL   BUN 33 (H) 8 - 23 mg/dL   Creatinine, Ser 8.47 (H) 0.44 - 1.00 mg/dL   Calcium  8.3 (L) 8.9 - 10.3 mg/dL   GFR, Estimated 37 (L) >60 mL/min   Anion gap 11 5 - 15  CBC   Collection Time: 12/29/23  5:11 AM  Result Value Ref Range   WBC 5.5 4.0 - 10.5 K/uL   RBC 3.96 3.87 - 5.11 MIL/uL   Hemoglobin 11.1 (L) 12.0 - 15.0 g/dL   HCT 65.4 (L) 63.9 - 53.9 %   MCV 87.1 80.0 - 100.0 fL   MCH 28.0 26.0 - 34.0 pg   MCHC 32.2 30.0 - 36.0 g/dL    RDW 84.6 88.4 - 84.4 %   Platelets 255 150 - 400 K/uL   nRBC 0.0 0.0 - 0.2 %  Cortisol   Collection Time: 12/29/23  5:11 AM  Result Value Ref Range   Cortisol, Plasma 12.9 ug/dL  Magnesium   Collection Time: 12/29/23  5:11 AM  Result Value Ref Range   Magnesium 2.2 1.7 - 2.4 mg/dL  Glucose, capillary   Collection Time: 12/29/23  7:48 AM  Result Value Ref Range   Glucose-Capillary 117 (H) 70 - 99 mg/dL  Basic metabolic panel with GFR   Collection Time: 12/29/23  2:50 PM  Result Value Ref Range   Sodium 137 135 - 145 mmol/L   Potassium 3.9 3.5 - 5.1 mmol/L   Chloride 101 98 - 111 mmol/L   CO2 22 22 - 32 mmol/L   Glucose, Bld 177 (H) 70 - 99 mg/dL   BUN 31 (H) 8 - 23 mg/dL   Creatinine, Ser 8.71 (H) 0.44 - 1.00 mg/dL   Calcium  8.1 (L) 8.9 - 10.3 mg/dL   GFR, Estimated 46 (L) >60 mL/min   Anion gap 14 5 - 15    Assessment and Plan  GAD (generalized anxiety disorder)  MDD (major depressive disorder), recurrent episode, moderate (HCC) Assessment & Plan: Chronic, poor control off of medication.  Using alprazolam  1 mg daily as needed. Offered referral to psychology and psychiatry but patient is not interested. She has a long history of mood disorder and has been on many medication.  We will try Lexapro  10 mg p.o. nightly with close follow-up in 4 weeks.  Return and ER precautions provided   Hypertension associated with diabetes [facility]) Assessment & Plan: Chronic, with recent hypertensive urgency.  Today her  blood pressure is looking wonderful on her current regimen.  She has had continued blood pressure fluctuations though that may primarily be related to anxiety and mood issues.  We will begin with addressing anxiety and depression and reevaluate in 1 month with blood pressure. She will continue to follow blood pressure at home and if she notices more numbers greater than 140/90 she will call for possible medication adjustment such as increasing amlodipine  to 10 mg  daily.  Amlodipine  5 mg nightly Carvedilol  25 mg p.o. twice daily HCTZ 25 mg daily Losartan  100 mg daily   Hypothyroidism due to acquired atrophy of thyroid  Assessment & Plan: Chronic, 1 month ago TSH, free T3 and free T4 were in the normal range but here in the hospital 2 weeks ago TSH was elevated.  If this is inadequately controlled this could be contributing to her blood pressure issues and mood issues. She states she has been compliant throughout with her levothyroxine  175 mcg daily.  She will come in for repeat TSH, free T3 and free T4 when she is able in the next few weeks.  Orders: -     TSH; Future -     T4, free; Future -     T3, free; Future  Atherosclerosis of aorta [facility]) Assessment & Plan: Chronic, she is on Crestor  20 mg daily.  We discussed low-cholesterol diet.   Other orders -     Escitalopram  Oxalate; Take 1 tablet (10 mg total) by mouth at bedtime.  Dispense: 30 tablet; Refill: 1    Return in about 4 weeks (around 02/10/2024) for follow up mood and blood pressure.   Greig Ring, MD

## 2024-01-13 NOTE — Assessment & Plan Note (Signed)
 Chronic, she is on Crestor  20 mg daily.  We discussed low-cholesterol diet.

## 2024-01-13 NOTE — Telephone Encounter (Signed)
 Left message for Debi giving verbal orders for Social Worker as requested.

## 2024-01-14 ENCOUNTER — Other Ambulatory Visit (HOSPITAL_COMMUNITY): Payer: Self-pay

## 2024-01-16 ENCOUNTER — Telehealth: Payer: Self-pay

## 2024-01-16 DIAGNOSIS — I119 Hypertensive heart disease without heart failure: Secondary | ICD-10-CM | POA: Diagnosis not present

## 2024-01-16 DIAGNOSIS — I160 Hypertensive urgency: Secondary | ICD-10-CM | POA: Diagnosis not present

## 2024-01-16 NOTE — Telephone Encounter (Signed)
 Ms. Hisle notified as instructed by telephone.  She states she can't come to office or urgent care due to her children are out of town and she doesn't have transportation.  Offered her the referral  to care management for transportation assistance and she declined stating she has done that before and it doesn't work.  She will push fluids as instructed and call us  back next week if diarrhea persist.

## 2024-01-16 NOTE — Telephone Encounter (Signed)
 Noted

## 2024-01-16 NOTE — Telephone Encounter (Signed)
 Please triage

## 2024-01-16 NOTE — Telephone Encounter (Signed)
 Call patient Recent blood pressure medicine changes should not cause diarrhea.  I am concerned given she was recently in the hospital that she might of picked up a bacteria.  Ideally I would like her to come by to have an appointment or to urgent careand collect a stool sample for ruling out C. difficile or other GI infection. We can send a referral to care management for transportation assistance if she has none. In the meantime she can push fluids.  Without knowing whether there is an ongoing infection I cannot recommend a medication such as an Imodium.

## 2024-01-16 NOTE — Telephone Encounter (Signed)
 Called patient symptoms started about 5 days ago she is having on average 3 watery stools a day. Denies any mucus or blood in the stool. No change in symptoms over time. She is drinking on 4-5 cups of water a day and a few cups of coffee in the am. She has had a decreased appetite but continuing to eat. Denies any nausea or vomiting. She is having some abdominal pain with bowel movents. She did have changes in blood pressure medication a few weeks ago. Denies any symptoms of dehydration.

## 2024-01-19 ENCOUNTER — Telehealth: Payer: Self-pay

## 2024-01-19 ENCOUNTER — Ambulatory Visit: Payer: Self-pay

## 2024-01-19 DIAGNOSIS — I119 Hypertensive heart disease without heart failure: Secondary | ICD-10-CM | POA: Diagnosis not present

## 2024-01-19 DIAGNOSIS — E1169 Type 2 diabetes mellitus with other specified complication: Secondary | ICD-10-CM

## 2024-01-19 DIAGNOSIS — I160 Hypertensive urgency: Secondary | ICD-10-CM | POA: Diagnosis not present

## 2024-01-19 NOTE — Telephone Encounter (Signed)
 Copied from CRM #[id number]. Topic: Clinical - Medication Question >> Jan 19, 2024  1:26 PM Shereese L wrote: Reason for CRM: April from [facility] Diarrhea for 6 days and was adv to come into the office but she is unable to afford a taxi... Metformin  may need to be decreased because she feels that's what's causing the diarrhea. Blood pressure is low and wanted to know if she needs to continue to take the hydrochlorothiazide  Nurse is going to her house tomorrow and wanted to know if any samples need to be taken April cb# [phone]

## 2024-01-19 NOTE — Telephone Encounter (Signed)
   Answer Assessment - Initial Assessment Questions Patient reports diarrhea continued, but that it has eased up today. Refused appointment, citing lack of transportation. Refused offer for referral assistance with transportation, states her daughter usually transports her and is out of town.  Patient would like to decrease metformin  to see if that improves her diarrhea. Reviewed PCP's message regarding recommendation to be seen to r/o infection. Patient stated she would prefer to ask about the metformin .  This RN advised to make sure to maintain hydration and that a message will be sent to PCP.  Protocols used: PCP Call - No Triage-A-AH

## 2024-01-20 ENCOUNTER — Other Ambulatory Visit: Payer: Self-pay | Admitting: Family Medicine

## 2024-01-20 DIAGNOSIS — I10 Essential (primary) hypertension: Secondary | ICD-10-CM

## 2024-01-20 DIAGNOSIS — I160 Hypertensive urgency: Secondary | ICD-10-CM | POA: Diagnosis not present

## 2024-01-20 DIAGNOSIS — I119 Hypertensive heart disease without heart failure: Secondary | ICD-10-CM | POA: Diagnosis not present

## 2024-01-20 MED ORDER — LOSARTAN POTASSIUM 100 MG PO TABS: 100.0000 mg | ORAL_TABLET | Freq: Every day | ORAL | 1 refills | Status: AC

## 2024-01-20 MED ORDER — AMLODIPINE BESYLATE 5 MG PO TABS: 5.0000 mg | ORAL_TABLET | Freq: Every day | ORAL | 1 refills | Status: DC

## 2024-01-20 MED ORDER — CARVEDILOL 25 MG PO TABS: 25.0000 mg | ORAL_TABLET | Freq: Two times a day (BID) | ORAL | 1 refills | Status: DC

## 2024-01-20 NOTE — Telephone Encounter (Signed)
 Refills sent as requested

## 2024-01-20 NOTE — Telephone Encounter (Signed)
 Copied from CRM #[id number]. Topic: Clinical - Medication Refill >> Jan 20, 2024  9:14 AM Berneda FALCON wrote: Medication: carvedilol  (COREG ) 25 MG tablet amLODipine  (NORVASC ) 5 MG tablet losartan  (COZAAR ) 100 MG tablet   Has the patient contacted their pharmacy? No, they were initially written by the hospital (Agent: If no, request that the patient contact the pharmacy for the refill. If patient does not wish to contact the pharmacy document the reason why and proceed with request.) (Agent: If yes, when and what did the pharmacy advise?)  This is the patient's preferred pharmacy:  [facility] [address] [place] - [address] [place] [address] Phone: [phone] Fax: [phone]  Is this the correct pharmacy for this prescription? Yes If no, delete pharmacy and type the correct one.   Has the prescription been filled recently? No  Is the patient out of the medication? No  Has the patient been seen for an appointment in the last year OR does the patient have an upcoming appointment? Yes  Can we respond through MyChart? No  Agent: Please be advised that Rx refills may take up to 3 business days. We ask that you follow-up with your pharmacy.

## 2024-01-20 NOTE — Telephone Encounter (Unsigned)
 Copied from CRM [id number]. 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   [facility] [address], [place] - [address] [place] [address] Phone: [phone] Fax: [phone] Hours: Not open 24 hours

## 2024-01-20 NOTE — Telephone Encounter (Signed)
 She can decrease metformin  to 1 tablet twice daily to see if diarrhea improves.  If it does but does not resolve completely she can stop metformin .  If she does stop, it we probably need to replace it with a different medication  She needs to follow her blood sugar daily fasting in the morning and call if it is above 120 or if 2 hours after meals that is above 180 as she stops the metformin .

## 2024-01-21 NOTE — Addendum Note (Signed)
 Addended by: WENDELL ARLAND RAMAN on: 01/21/2024 10:35 AM   Modules accepted: Orders

## 2024-01-21 NOTE — Telephone Encounter (Signed)
 Spoke with April with [facility] and advised her of Dr. Sherrel recommendations in regards to the Metformin .  She states patient took it upon herself and already decreased the Metformin  to bid and the diarrhea has resolved.  I will update medication list.  FYI to Dr. Avelina.

## 2024-01-21 NOTE — Telephone Encounter (Signed)
 Spoke with April from [facility] and advised patient's Lexapro  was refilled on 01/13/2024 for #30 with 1 refills so I am thinking [facility] must of filled an old Rx on file.  April will call and check with [facility] about this because her current bottle says 1 tablet daily but for only #15 with 3 refills.

## 2024-01-23 ENCOUNTER — Other Ambulatory Visit (HOSPITAL_COMMUNITY): Payer: Self-pay

## 2024-01-26 DIAGNOSIS — I119 Hypertensive heart disease without heart failure: Secondary | ICD-10-CM | POA: Diagnosis not present

## 2024-01-26 DIAGNOSIS — I160 Hypertensive urgency: Secondary | ICD-10-CM | POA: Diagnosis not present

## 2024-01-30 ENCOUNTER — Encounter

## 2024-02-03 ENCOUNTER — Other Ambulatory Visit: Payer: Self-pay | Admitting: Family Medicine

## 2024-02-03 DIAGNOSIS — M47816 Spondylosis without myelopathy or radiculopathy, lumbar region: Secondary | ICD-10-CM | POA: Diagnosis not present

## 2024-02-03 DIAGNOSIS — Z7984 Long term (current) use of oral hypoglycemic drugs: Secondary | ICD-10-CM | POA: Diagnosis not present

## 2024-02-03 DIAGNOSIS — M170 Bilateral primary osteoarthritis of knee: Secondary | ICD-10-CM | POA: Diagnosis not present

## 2024-02-03 DIAGNOSIS — M47817 Spondylosis without myelopathy or radiculopathy, lumbosacral region: Secondary | ICD-10-CM | POA: Diagnosis not present

## 2024-02-03 DIAGNOSIS — F17210 Nicotine dependence, cigarettes, uncomplicated: Secondary | ICD-10-CM | POA: Diagnosis not present

## 2024-02-03 DIAGNOSIS — M8580 Other specified disorders of bone density and structure, unspecified site: Secondary | ICD-10-CM | POA: Diagnosis not present

## 2024-02-03 DIAGNOSIS — M47818 Spondylosis without myelopathy or radiculopathy, sacral and sacrococcygeal region: Secondary | ICD-10-CM | POA: Diagnosis not present

## 2024-02-03 DIAGNOSIS — I119 Hypertensive heart disease without heart failure: Secondary | ICD-10-CM | POA: Diagnosis not present

## 2024-02-03 DIAGNOSIS — K449 Diaphragmatic hernia without obstruction or gangrene: Secondary | ICD-10-CM | POA: Diagnosis not present

## 2024-02-03 DIAGNOSIS — F32A Depression, unspecified: Secondary | ICD-10-CM

## 2024-02-03 DIAGNOSIS — D3502 Benign neoplasm of left adrenal gland: Secondary | ICD-10-CM | POA: Diagnosis not present

## 2024-02-03 DIAGNOSIS — I160 Hypertensive urgency: Secondary | ICD-10-CM | POA: Diagnosis not present

## 2024-02-03 DIAGNOSIS — E890 Postprocedural hypothyroidism: Secondary | ICD-10-CM | POA: Diagnosis not present

## 2024-02-03 DIAGNOSIS — K219 Gastro-esophageal reflux disease without esophagitis: Secondary | ICD-10-CM | POA: Diagnosis not present

## 2024-02-03 DIAGNOSIS — I701 Atherosclerosis of renal artery: Secondary | ICD-10-CM | POA: Diagnosis not present

## 2024-02-03 DIAGNOSIS — F5104 Psychophysiologic insomnia: Secondary | ICD-10-CM

## 2024-02-03 DIAGNOSIS — K8020 Calculus of gallbladder without cholecystitis without obstruction: Secondary | ICD-10-CM | POA: Diagnosis not present

## 2024-02-03 DIAGNOSIS — E785 Hyperlipidemia, unspecified: Secondary | ICD-10-CM | POA: Diagnosis not present

## 2024-02-03 DIAGNOSIS — Z9181 History of falling: Secondary | ICD-10-CM | POA: Diagnosis not present

## 2024-02-03 DIAGNOSIS — E119 Type 2 diabetes mellitus without complications: Secondary | ICD-10-CM | POA: Diagnosis not present

## 2024-02-03 DIAGNOSIS — Z85850 Personal history of malignant neoplasm of thyroid: Secondary | ICD-10-CM | POA: Diagnosis not present

## 2024-02-03 NOTE — Telephone Encounter (Signed)
 Last office visit 01/13/2024 for GAD.  Last refilled 01/07/2024 for #90 with no refills.  Next appt: 02/27/2024 for BP check.

## 2024-02-06 ENCOUNTER — Telehealth: Payer: Self-pay | Admitting: Family Medicine

## 2024-02-06 NOTE — Telephone Encounter (Signed)
 Called patient to let them know we would not authorize anything like this over the phone. We would also check with the patient if we received any type of prescription request  Patient stated they just changed to [employer].  Copied from CRM [id number]. Topic: General - Other >> Feb 06, 2024 11:39 AM Martinique E wrote: Reason for CRM: Patient stated she keeps getting calls from [facility] where she is being told that her provider has to select an opt-in notice. Patient unsure what this entails. Callback number for patient is [phone].

## 2024-02-10 DIAGNOSIS — D3502 Benign neoplasm of left adrenal gland: Secondary | ICD-10-CM | POA: Diagnosis not present

## 2024-02-10 DIAGNOSIS — M47816 Spondylosis without myelopathy or radiculopathy, lumbar region: Secondary | ICD-10-CM | POA: Diagnosis not present

## 2024-02-10 DIAGNOSIS — Z9181 History of falling: Secondary | ICD-10-CM | POA: Diagnosis not present

## 2024-02-10 DIAGNOSIS — F17210 Nicotine dependence, cigarettes, uncomplicated: Secondary | ICD-10-CM | POA: Diagnosis not present

## 2024-02-10 DIAGNOSIS — K8020 Calculus of gallbladder without cholecystitis without obstruction: Secondary | ICD-10-CM | POA: Diagnosis not present

## 2024-02-10 DIAGNOSIS — M47818 Spondylosis without myelopathy or radiculopathy, sacral and sacrococcygeal region: Secondary | ICD-10-CM | POA: Diagnosis not present

## 2024-02-10 DIAGNOSIS — M47817 Spondylosis without myelopathy or radiculopathy, lumbosacral region: Secondary | ICD-10-CM | POA: Diagnosis not present

## 2024-02-10 DIAGNOSIS — E890 Postprocedural hypothyroidism: Secondary | ICD-10-CM | POA: Diagnosis not present

## 2024-02-10 DIAGNOSIS — Z85850 Personal history of malignant neoplasm of thyroid: Secondary | ICD-10-CM | POA: Diagnosis not present

## 2024-02-10 DIAGNOSIS — M8580 Other specified disorders of bone density and structure, unspecified site: Secondary | ICD-10-CM | POA: Diagnosis not present

## 2024-02-10 DIAGNOSIS — M170 Bilateral primary osteoarthritis of knee: Secondary | ICD-10-CM | POA: Diagnosis not present

## 2024-02-10 DIAGNOSIS — K449 Diaphragmatic hernia without obstruction or gangrene: Secondary | ICD-10-CM | POA: Diagnosis not present

## 2024-02-10 DIAGNOSIS — E785 Hyperlipidemia, unspecified: Secondary | ICD-10-CM | POA: Diagnosis not present

## 2024-02-10 DIAGNOSIS — I701 Atherosclerosis of renal artery: Secondary | ICD-10-CM | POA: Diagnosis not present

## 2024-02-10 DIAGNOSIS — E119 Type 2 diabetes mellitus without complications: Secondary | ICD-10-CM | POA: Diagnosis not present

## 2024-02-10 DIAGNOSIS — K219 Gastro-esophageal reflux disease without esophagitis: Secondary | ICD-10-CM | POA: Diagnosis not present

## 2024-02-10 DIAGNOSIS — Z7984 Long term (current) use of oral hypoglycemic drugs: Secondary | ICD-10-CM | POA: Diagnosis not present

## 2024-02-10 DIAGNOSIS — I119 Hypertensive heart disease without heart failure: Secondary | ICD-10-CM | POA: Diagnosis not present

## 2024-02-10 DIAGNOSIS — I160 Hypertensive urgency: Secondary | ICD-10-CM | POA: Diagnosis not present

## 2024-02-16 ENCOUNTER — Other Ambulatory Visit: Payer: Self-pay | Admitting: Family Medicine

## 2024-02-16 DIAGNOSIS — E1169 Type 2 diabetes mellitus with other specified complication: Secondary | ICD-10-CM

## 2024-02-17 DIAGNOSIS — F17210 Nicotine dependence, cigarettes, uncomplicated: Secondary | ICD-10-CM | POA: Diagnosis not present

## 2024-02-17 DIAGNOSIS — M47816 Spondylosis without myelopathy or radiculopathy, lumbar region: Secondary | ICD-10-CM | POA: Diagnosis not present

## 2024-02-17 DIAGNOSIS — K8020 Calculus of gallbladder without cholecystitis without obstruction: Secondary | ICD-10-CM | POA: Diagnosis not present

## 2024-02-17 DIAGNOSIS — I160 Hypertensive urgency: Secondary | ICD-10-CM | POA: Diagnosis not present

## 2024-02-17 DIAGNOSIS — Z7984 Long term (current) use of oral hypoglycemic drugs: Secondary | ICD-10-CM | POA: Diagnosis not present

## 2024-02-17 DIAGNOSIS — M170 Bilateral primary osteoarthritis of knee: Secondary | ICD-10-CM | POA: Diagnosis not present

## 2024-02-17 DIAGNOSIS — M8580 Other specified disorders of bone density and structure, unspecified site: Secondary | ICD-10-CM | POA: Diagnosis not present

## 2024-02-17 DIAGNOSIS — K449 Diaphragmatic hernia without obstruction or gangrene: Secondary | ICD-10-CM | POA: Diagnosis not present

## 2024-02-17 DIAGNOSIS — E119 Type 2 diabetes mellitus without complications: Secondary | ICD-10-CM | POA: Diagnosis not present

## 2024-02-17 DIAGNOSIS — M47817 Spondylosis without myelopathy or radiculopathy, lumbosacral region: Secondary | ICD-10-CM | POA: Diagnosis not present

## 2024-02-17 DIAGNOSIS — K219 Gastro-esophageal reflux disease without esophagitis: Secondary | ICD-10-CM | POA: Diagnosis not present

## 2024-02-17 DIAGNOSIS — I701 Atherosclerosis of renal artery: Secondary | ICD-10-CM | POA: Diagnosis not present

## 2024-02-17 DIAGNOSIS — Z85850 Personal history of malignant neoplasm of thyroid: Secondary | ICD-10-CM | POA: Diagnosis not present

## 2024-02-17 DIAGNOSIS — I119 Hypertensive heart disease without heart failure: Secondary | ICD-10-CM | POA: Diagnosis not present

## 2024-02-17 DIAGNOSIS — M47818 Spondylosis without myelopathy or radiculopathy, sacral and sacrococcygeal region: Secondary | ICD-10-CM | POA: Diagnosis not present

## 2024-02-17 DIAGNOSIS — E890 Postprocedural hypothyroidism: Secondary | ICD-10-CM | POA: Diagnosis not present

## 2024-02-17 DIAGNOSIS — Z9181 History of falling: Secondary | ICD-10-CM | POA: Diagnosis not present

## 2024-02-17 DIAGNOSIS — E785 Hyperlipidemia, unspecified: Secondary | ICD-10-CM | POA: Diagnosis not present

## 2024-02-17 DIAGNOSIS — D3502 Benign neoplasm of left adrenal gland: Secondary | ICD-10-CM | POA: Diagnosis not present

## 2024-02-27 ENCOUNTER — Ambulatory Visit: Admitting: Family Medicine

## 2024-03-14 ENCOUNTER — Other Ambulatory Visit: Payer: Self-pay | Admitting: Family Medicine

## 2024-03-14 DIAGNOSIS — F32A Depression, unspecified: Secondary | ICD-10-CM

## 2024-03-14 DIAGNOSIS — F5104 Psychophysiologic insomnia: Secondary | ICD-10-CM

## 2024-03-15 NOTE — Telephone Encounter (Signed)
 Last office visit 01/13/2024 for GAD/Depression.  Last refilled 02/03/24 for #90 with no refills.  Next Appt: 03/31/24 for BP issues.

## 2024-03-22 ENCOUNTER — Other Ambulatory Visit: Payer: Self-pay | Admitting: Family Medicine

## 2024-03-31 ENCOUNTER — Ambulatory Visit: Admitting: Family Medicine

## 2024-04-07 ENCOUNTER — Encounter: Payer: Self-pay | Admitting: Family Medicine

## 2024-04-07 ENCOUNTER — Ambulatory Visit: Admitting: Family Medicine

## 2024-04-07 VITALS — BP 126/64 | HR 64 | Temp 98.7°F | Resp 18 | Ht 61.75 in | Wt 188.5 lb

## 2024-04-07 DIAGNOSIS — E785 Hyperlipidemia, unspecified: Secondary | ICD-10-CM

## 2024-04-07 DIAGNOSIS — R5383 Other fatigue: Secondary | ICD-10-CM | POA: Diagnosis not present

## 2024-04-07 DIAGNOSIS — E1169 Type 2 diabetes mellitus with other specified complication: Secondary | ICD-10-CM

## 2024-04-07 DIAGNOSIS — I700 Atherosclerosis of aorta: Secondary | ICD-10-CM | POA: Diagnosis not present

## 2024-04-07 DIAGNOSIS — D3502 Benign neoplasm of left adrenal gland: Secondary | ICD-10-CM | POA: Diagnosis not present

## 2024-04-07 DIAGNOSIS — E1159 Type 2 diabetes mellitus with other circulatory complications: Secondary | ICD-10-CM | POA: Diagnosis not present

## 2024-04-07 DIAGNOSIS — E559 Vitamin D deficiency, unspecified: Secondary | ICD-10-CM

## 2024-04-07 DIAGNOSIS — I739 Peripheral vascular disease, unspecified: Secondary | ICD-10-CM

## 2024-04-07 DIAGNOSIS — I152 Hypertension secondary to endocrine disorders: Secondary | ICD-10-CM | POA: Diagnosis not present

## 2024-04-07 DIAGNOSIS — E034 Atrophy of thyroid (acquired): Secondary | ICD-10-CM | POA: Diagnosis not present

## 2024-04-07 DIAGNOSIS — Z720 Tobacco use: Secondary | ICD-10-CM

## 2024-04-07 DIAGNOSIS — Z7984 Long term (current) use of oral hypoglycemic drugs: Secondary | ICD-10-CM

## 2024-04-07 LAB — COMPREHENSIVE METABOLIC PANEL WITH GFR
ALT: 11 U/L (ref 0–35)
AST: 11 U/L (ref 0–37)
Albumin: 4.2 g/dL (ref 3.5–5.2)
Alkaline Phosphatase: 89 U/L (ref 39–117)
BUN: 16 mg/dL (ref 6–23)
CO2: 30 meq/L (ref 19–32)
Calcium: 9.3 mg/dL (ref 8.4–10.5)
Chloride: 99 meq/L (ref 96–112)
Creatinine, Ser: 1.05 mg/dL (ref 0.40–1.20)
GFR: 54.92 mL/min — ABNORMAL LOW (ref >60.00)
Glucose, Bld: 90 mg/dL (ref 70–99)
Potassium: 4 meq/L (ref 3.5–5.1)
Sodium: 138 meq/L (ref 135–145)
Total Bilirubin: 0.3 mg/dL (ref 0.2–1.2)
Total Protein: 6.6 g/dL (ref 6.0–8.3)

## 2024-04-07 LAB — LIPID PANEL
Cholesterol: 117 mg/dL (ref 0–200)
HDL: 36.9 mg/dL — ABNORMAL LOW (ref >39.00)
LDL Cholesterol: 36 mg/dL (ref 0–99)
NonHDL: 80.13
Total CHOL/HDL Ratio: 3
Triglycerides: 222 mg/dL — ABNORMAL HIGH (ref 0.0–149.0)
VLDL: 44.4 mg/dL — ABNORMAL HIGH (ref 0.0–40.0)

## 2024-04-07 LAB — HEMOGLOBIN A1C: Hgb A1c MFr Bld: 6.3 % (ref 4.6–6.5)

## 2024-04-07 NOTE — Assessment & Plan Note (Signed)
 Due for reevaluation.SABRA given aortic atherosclerosis severe.. needs LDL < 70 Encouraged exercise, weight loss, healthy eating habits.  On crestor  20 mg daily

## 2024-04-07 NOTE — Assessment & Plan Note (Signed)
 Chronic, improved control  Amlodipine  5 mg nightly Carvedilol  25 mg p.o. twice daily HCTZ 25 mg daily Losartan  100 mg daily

## 2024-04-07 NOTE — Assessment & Plan Note (Signed)
 Chronic, well controlled on metformin . Recent new elevated microalbumin 8 ratio.  Discussed once she is more financially secure that we will conside SGLT2 inhibitor.  She is already on ARB.   Due for re-eval

## 2024-04-07 NOTE — Progress Notes (Signed)
 Patient ID: Virginia Romero, female    DOB: July 28, 1955, 68 y.o.   MRN: [id number]  This visit was conducted in person.  BP 126/64   Pulse 64   Temp 98.7 F (37.1 C) (Oral)   Resp 18   Ht 5' 1.75 (1.568 m)   Wt 188 lb 8 oz (85.5 kg)   SpO2 95%   BMI 34.76 kg/m    CC:  Chief Complaint  Patient presents with   Follow-up    Appt scheduled for bp concerns, states she is actually here for a regular check up     Subjective:   HPI: Virginia Romero is a 68 y.o. female presenting on 04/07/2024 for   DM follow up.  Hypertension:   She had been noting BP fluctuations ( was seen with Hypertensive urgency 12/26/2023 in ED) On losartan  100 mg daily,  HCTZ 25 mg daily, amlodipine  5 mg  coreg  25  BID BP Readings from Last 3 Encounters:  04/07/24 126/64  01/13/24 114/60  12/29/23 137/70  Using medication without problems or lightheadedness:  none Chest pain with exertion:none Edema:  none Short of breath: Average home BPs: 120/60 Other issues:  Diabetes:  well controlled on metformin  500 mg 2 tablets twice daily Lab Results  Component Value Date   HGBA1C 6.3 (A) 11/27/2023  Using medications without difficulties: Hypoglycemic episodes: Hyperglycemic episodes: Feet problems: no ulcer Blood Sugars averaging: not checking eye exam within last year:  She is  not having as much financial difficulty.Noted.   She does note pain in calves with walking that goes away with rest... did have severe aortic atherosclerosis in [place] on angio CT.  Hypothyroid well controlled...  now on 175 mcg daily Lab Results  Component Value Date   TSH 14.332 (H) 12/26/2023    MDD, Anxiety; tolerable control.  She is on lexapro  10 mg daily   Relevant past medical, surgical, family and social history reviewed and updated as indicated. Interim medical history since our last visit reviewed. Allergies and medications reviewed and updated. Outpatient Medications Prior to Visit  Medication Sig Dispense  Refill   acetaminophen  (TYLENOL ) 325 MG tablet Take 650 mg by mouth every 6 (six) hours as needed for mild pain (pain score 1-3).     ALPRAZolam  (XANAX ) 1 MG tablet TAKE 1 TABLET BY MOUTH THREE TIMES DAILY AS NEEDED FOR ANXIETY 90 tablet 0   amLODipine  (NORVASC ) 5 MG tablet Take 1 tablet (5 mg total) by mouth daily. 90 tablet 1   carvedilol  (COREG ) 25 MG tablet Take 1 tablet (25 mg total) by mouth 2 (two) times daily with a meal. 180 tablet 1   escitalopram  (LEXAPRO ) 10 MG tablet TAKE 1 TABLET BY MOUTH AT BEDTIME 30 tablet 0   hydrochlorothiazide  (HYDRODIURIL ) 25 MG tablet Take 1 tablet by mouth once daily 90 tablet 1   levothyroxine  (SYNTHROID ) 175 MCG tablet Take 1 tablet (175 mcg total) by mouth daily at 6 (six) AM. 30 tablet 0   losartan  (COZAAR ) 100 MG tablet Take 1 tablet (100 mg total) by mouth daily. 90 tablet 1   metFORMIN  (GLUCOPHAGE ) 500 MG tablet TAKE 2 TABLETS BY MOUTH TWICE DAILY WITH A MEAL 60 tablet 5   rosuvastatin  (CRESTOR ) 20 MG tablet Take 1 tablet (20 mg total) by mouth daily. 90 tablet 3   Accu-Chek Softclix Lancets lancets Use to check blood sugar daily (Patient not taking: Reported on 04/07/2024) 100 each 3   Blood Glucose Calibration (ACCU-CHEK GUIDE CONTROL)  LIQD Use to check controls on glucose meter (Patient not taking: Reported on 04/07/2024) 1 each 3   Blood Glucose Monitoring Suppl (ACCU-CHEK GUIDE) w/Device KIT Use to check blood sugar daily (Patient not taking: Reported on 04/07/2024) 1 kit 0   glucose blood (ACCU-CHEK GUIDE TEST) test strip Use to check blood sugar daily (Patient not taking: Reported on 04/07/2024) 100 each 3   Vitamin D , Ergocalciferol , (DRISDOL ) 1.25 MG (50000 UNIT) CAPS capsule Take 1 capsule (50,000 Units total) by mouth once a week. (Patient not taking: Reported on 04/07/2024) 12 capsule 0   No facility-administered medications prior to visit.     Per HPI unless specifically indicated in ROS section below Review of Systems  Constitutional:   Negative for fatigue and fever.  HENT:  Negative for congestion.   Eyes:  Negative for pain.  Respiratory:  Negative for cough and shortness of breath.   Cardiovascular:  Negative for chest pain, palpitations and leg swelling.  Gastrointestinal:  Negative for abdominal pain.  Genitourinary:  Negative for dysuria and vaginal bleeding.  Musculoskeletal:  Negative for back pain.  Skin:  Positive for rash.  Neurological:  Negative for syncope, light-headedness and headaches.  Psychiatric/Behavioral:  Negative for dysphoric mood.    Objective:  BP 126/64   Pulse 64   Temp 98.7 F (37.1 C) (Oral)   Resp 18   Ht 5' 1.75 (1.568 m)   Wt 188 lb 8 oz (85.5 kg)   SpO2 95%   BMI 34.76 kg/m   Wt Readings from Last 3 Encounters:  04/07/24 188 lb 8 oz (85.5 kg)  12/26/23 185 lb (83.9 kg)  11/27/23 192 lb (87.1 kg)      Physical Exam Vitals and nursing note reviewed.  Constitutional:      General: She is not in acute distress.    Appearance: Normal appearance. She is well-developed. She is not ill-appearing or toxic-appearing.  HENT:     Head: Normocephalic.     Right Ear: Hearing, tympanic membrane, ear canal and external ear normal.     Left Ear: Hearing, tympanic membrane, ear canal and external ear normal.     Nose: Nose normal.  Eyes:     General: Lids are normal. Lids are everted, no foreign bodies appreciated.     Conjunctiva/sclera: Conjunctivae normal.     Pupils: Pupils are equal, round, and reactive to light.  Neck:     Thyroid : No thyroid  mass or thyromegaly.     Vascular: No carotid bruit.     Trachea: Trachea normal.  Cardiovascular:     Rate and Rhythm: Normal rate and regular rhythm.     Heart sounds: Normal heart sounds, S1 normal and S2 normal. No murmur heard.    No gallop.  Pulmonary:     Effort: Pulmonary effort is normal. No respiratory distress.     Breath sounds: Normal breath sounds. No wheezing, rhonchi or rales.  Abdominal:     General: Bowel sounds  are normal. There is no distension or abdominal bruit.     Palpations: Abdomen is soft. There is no fluid wave or mass.     Tenderness: There is no abdominal tenderness. There is no guarding or rebound.     Hernia: No hernia is present.  Musculoskeletal:     Cervical back: Normal range of motion and neck supple.  Lymphadenopathy:     Cervical: No cervical adenopathy.  Skin:    General: Skin is warm and dry.  Findings: No rash.  Neurological:     Mental Status: She is alert.     Cranial Nerves: No cranial nerve deficit.     Sensory: No sensory deficit.  Psychiatric:        Mood and Affect: Mood is not anxious or depressed.        Speech: Speech normal.        Behavior: Behavior normal. Behavior is cooperative.        Judgment: Judgment normal.    Results for orders placed or performed during the hospital encounter of 12/26/23  Urinalysis, Routine w reflex microscopic -Urine, Clean Catch   Collection Time: 12/26/23  1:30 AM  Result Value Ref Range   Color, Urine STRAW (A) YELLOW   APPearance CLEAR CLEAR   Specific Gravity, Urine 1.005 1.005 - 1.030   pH 7.0 5.0 - 8.0   Glucose, UA NEGATIVE NEGATIVE mg/dL   Hgb urine dipstick NEGATIVE NEGATIVE   Bilirubin Urine NEGATIVE NEGATIVE   Ketones, ur NEGATIVE NEGATIVE mg/dL   Protein, ur NEGATIVE NEGATIVE mg/dL   Nitrite NEGATIVE NEGATIVE   Leukocytes,Ua NEGATIVE NEGATIVE  Rapid urine drug screen (hospital performed)   Collection Time: 12/26/23  1:30 AM  Result Value Ref Range   Opiates NONE DETECTED NONE DETECTED   Cocaine NONE DETECTED NONE DETECTED   Benzodiazepines POSITIVE (A) NONE DETECTED   Amphetamines NONE DETECTED NONE DETECTED   Tetrahydrocannabinol NONE DETECTED NONE DETECTED   Barbiturates NONE DETECTED NONE DETECTED  CBC   Collection Time: 12/26/23  1:40 AM  Result Value Ref Range   WBC 6.7 4.0 - 10.5 K/uL   RBC 4.41 3.87 - 5.11 MIL/uL   Hemoglobin 12.3 12.0 - 15.0 g/dL   HCT 60.9 63.9 - 53.9 %   MCV 88.4  80.0 - 100.0 fL   MCH 27.9 26.0 - 34.0 pg   MCHC 31.5 30.0 - 36.0 g/dL   RDW 85.3 88.4 - 84.4 %   Platelets 324 150 - 400 K/uL   nRBC 0.0 0.0 - 0.2 %  Basic metabolic panel with GFR   Collection Time: 12/26/23  1:40 AM  Result Value Ref Range   Sodium 141 135 - 145 mmol/L   Potassium 4.0 3.5 - 5.1 mmol/L   Chloride 105 98 - 111 mmol/L   CO2 24 22 - 32 mmol/L   Glucose, Bld 121 (H) 70 - 99 mg/dL   BUN 12 8 - 23 mg/dL   Creatinine, Ser 8.99 0.44 - 1.00 mg/dL   Calcium  8.7 (L) 8.9 - 10.3 mg/dL   GFR, Estimated >39 >39 mL/min   Anion gap 12 5 - 15  Troponin I (High Sensitivity)   Collection Time: 12/26/23  1:40 AM  Result Value Ref Range   Troponin I (High Sensitivity) 13 <18 ng/L  TSH   Collection Time: 12/26/23  3:40 AM  Result Value Ref Range   TSH 14.332 (H) 0.350 - 4.500 uIU/mL  Troponin I (High Sensitivity)   Collection Time: 12/26/23  3:40 AM  Result Value Ref Range   Troponin I (High Sensitivity) 18 (H) <18 ng/L  Metanephrines, plasma   Collection Time: 12/26/23 10:14 AM  Result Value Ref Range   Normetanephrine, Free 152.8 0.0 - 285.2 pg/mL   Metanephrine, Free <25.0 0.0 - 88.0 pg/mL  Aldosterone + renin activity w/ ratio   Collection Time: 12/26/23 10:14 AM  Result Value Ref Range   PRA LC/MS/MS <0.167 (L) 0.167 - 5.380 ng/mL/hr   ALDO / PRA Ratio >25.7  0.0 - 30.0   Aldosterone 4.3 0.0 - 30.0 ng/dL  HIV Antibody (routine testing w rflx)   Collection Time: 12/27/23  2:47 AM  Result Value Ref Range   HIV Screen 4th Generation wRfx Non Reactive Non Reactive  CBC   Collection Time: 12/27/23  2:47 AM  Result Value Ref Range   WBC 5.6 4.0 - 10.5 K/uL   RBC 4.34 3.87 - 5.11 MIL/uL   Hemoglobin 11.8 (L) 12.0 - 15.0 g/dL   HCT 62.9 63.9 - 53.9 %   MCV 85.3 80.0 - 100.0 fL   MCH 27.2 26.0 - 34.0 pg   MCHC 31.9 30.0 - 36.0 g/dL   RDW 84.9 88.4 - 84.4 %   Platelets 287 150 - 400 K/uL   nRBC 0.0 0.0 - 0.2 %  Comprehensive metabolic panel with GFR   Collection  Time: 12/27/23  2:47 AM  Result Value Ref Range   Sodium 136 135 - 145 mmol/L   Potassium 4.0 3.5 - 5.1 mmol/L   Chloride 103 98 - 111 mmol/L   CO2 24 22 - 32 mmol/L   Glucose, Bld 121 (H) 70 - 99 mg/dL   BUN 16 8 - 23 mg/dL   Creatinine, Ser 8.81 (H) 0.44 - 1.00 mg/dL   Calcium  8.4 (L) 8.9 - 10.3 mg/dL   Total Protein 6.3 (L) 6.5 - 8.1 g/dL   Albumin 3.2 (L) 3.5 - 5.0 g/dL   AST 11 (L) 15 - 41 U/L   ALT 11 0 - 44 U/L   Alkaline Phosphatase 94 38 - 126 U/L   Total Bilirubin 0.7 0.0 - 1.2 mg/dL   GFR, Estimated 51 (L) >60 mL/min   Anion gap 9 5 - 15  Troponin I (High Sensitivity)   Collection Time: 12/27/23  8:41 AM  Result Value Ref Range   Troponin I (High Sensitivity) 14 <18 ng/L  Basic metabolic panel with GFR   Collection Time: 12/28/23  5:04 AM  Result Value Ref Range   Sodium 136 135 - 145 mmol/L   Potassium 3.8 3.5 - 5.1 mmol/L   Chloride 99 98 - 111 mmol/L   CO2 24 22 - 32 mmol/L   Glucose, Bld 119 (H) 70 - 99 mg/dL   BUN 22 8 - 23 mg/dL   Creatinine, Ser 8.61 (H) 0.44 - 1.00 mg/dL   Calcium  8.7 (L) 8.9 - 10.3 mg/dL   GFR, Estimated 42 (L) >60 mL/min   Anion gap 13 5 - 15  CBC   Collection Time: 12/28/23  5:04 AM  Result Value Ref Range   WBC 5.0 4.0 - 10.5 K/uL   RBC 4.12 3.87 - 5.11 MIL/uL   Hemoglobin 11.3 (L) 12.0 - 15.0 g/dL   HCT 64.6 (L) 63.9 - 53.9 %   MCV 85.7 80.0 - 100.0 fL   MCH 27.4 26.0 - 34.0 pg   MCHC 32.0 30.0 - 36.0 g/dL   RDW 84.7 88.4 - 84.4 %   Platelets 258 150 - 400 K/uL   nRBC 0.0 0.0 - 0.2 %  Aldosterone + renin activity w/ ratio   Collection Time: 12/28/23  1:17 PM  Result Value Ref Range   PRA LC/MS/MS 1.524 0.167 - 5.380 ng/mL/hr   ALDO / PRA Ratio 5.4 0.0 - 30.0   Aldosterone 8.3 0.0 - 30.0 ng/dL  Metanephrines, plasma   Collection Time: 12/28/23  1:17 PM  Result Value Ref Range   Normetanephrine, Free 108.0 0.0 - 285.2 pg/mL   Metanephrine,  Free <25.0 0.0 - 88.0 pg/mL  Basic metabolic panel with GFR   Collection Time:  12/29/23  5:11 AM  Result Value Ref Range   Sodium 137 135 - 145 mmol/L   Potassium 4.0 3.5 - 5.1 mmol/L   Chloride 103 98 - 111 mmol/L   CO2 23 22 - 32 mmol/L   Glucose, Bld 119 (H) 70 - 99 mg/dL   BUN 33 (H) 8 - 23 mg/dL   Creatinine, Ser 8.47 (H) 0.44 - 1.00 mg/dL   Calcium  8.3 (L) 8.9 - 10.3 mg/dL   GFR, Estimated 37 (L) >60 mL/min   Anion gap 11 5 - 15  CBC   Collection Time: 12/29/23  5:11 AM  Result Value Ref Range   WBC 5.5 4.0 - 10.5 K/uL   RBC 3.96 3.87 - 5.11 MIL/uL   Hemoglobin 11.1 (L) 12.0 - 15.0 g/dL   HCT 65.4 (L) 63.9 - 53.9 %   MCV 87.1 80.0 - 100.0 fL   MCH 28.0 26.0 - 34.0 pg   MCHC 32.2 30.0 - 36.0 g/dL   RDW 84.6 88.4 - 84.4 %   Platelets 255 150 - 400 K/uL   nRBC 0.0 0.0 - 0.2 %  Cortisol   Collection Time: 12/29/23  5:11 AM  Result Value Ref Range   Cortisol, Plasma 12.9 ug/dL  Magnesium   Collection Time: 12/29/23  5:11 AM  Result Value Ref Range   Magnesium 2.2 1.7 - 2.4 mg/dL  Glucose, capillary   Collection Time: 12/29/23  7:48 AM  Result Value Ref Range   Glucose-Capillary 117 (H) 70 - 99 mg/dL  Basic metabolic panel with GFR   Collection Time: 12/29/23  2:50 PM  Result Value Ref Range   Sodium 137 135 - 145 mmol/L   Potassium 3.9 3.5 - 5.1 mmol/L   Chloride 101 98 - 111 mmol/L   CO2 22 22 - 32 mmol/L   Glucose, Bld 177 (H) 70 - 99 mg/dL   BUN 31 (H) 8 - 23 mg/dL   Creatinine, Ser 8.71 (H) 0.44 - 1.00 mg/dL   Calcium  8.1 (L) 8.9 - 10.3 mg/dL   GFR, Estimated 46 (L) >60 mL/min   Anion gap 14 5 - 15     COVID 19 screen:  No recent travel or known exposure to COVID19 The patient denies respiratory symptoms of COVID 19 at this time. The importance of social distancing was discussed today.   Assessment and Plan  Due for yearly eye exam.Pt reminded.  Problem List Items Addressed This Visit     Abdominal aortic atherosclerosis   Chronic, seen on August 2025 CT angio abdomen  She is on Crestor  20 mg daily.  We discussed  low-cholesterol diet.  Given severity CT angio in 2 years recommended for reevaluation.      Adenoma of left adrenal gland    Noted incidentally.. MR unremarkable no further imaging required.      DM type 2 with diabetic dyslipidemia (HCC)    Chronic, well controlled on metformin . Recent new elevated microalbumin 8 ratio.  Discussed once she is more financially secure that we will conside SGLT2 inhibitor.  She is already on ARB.   Due for re-eval       Hyperlipidemia associated with type 2 diabetes mellitus (HCC)   Due for reevaluation.SABRA given aortic atherosclerosis severe.. needs LDL < 70 Encouraged exercise, weight loss, healthy eating habits.  On crestor  20 mg daily       Hypertension associated with  diabetes (HCC) - Primary   Chronic, improved control  Amlodipine  5 mg nightly Carvedilol  25 mg p.o. twice daily HCTZ 25 mg daily Losartan  100 mg daily      Relevant Orders   Hemoglobin A1c   Lipid panel   Comprehensive metabolic panel with GFR   Hypothyroid   Chronic, 1 month ago TSH, free T3 and free T4 were in the normal range but here in the hospital  ago TSH was elevated.  If this is inadequately controlled this could be contributing to her blood pressure issues and mood issues. She states she has been compliant throughout with her levothyroxine  175 mcg daily.  She never came  in for repeat TSH, free T3 and free T4 ... Will check today.      Relevant Orders   TSH   T4, free   T3, free   Intermittent claudication    Acute, possible, slightly diminished pulses bilateral dorsalis pedis and posterior tibialis. Will evaluate ABIs.  Encouraged smoking cessation but she is precontemplative.      Relevant Orders   VAS US  ABI WITH/WO TBI   Tobacco abuse   Smoking cessation instruction/counseling given:  counseled patient on the dangers of tobacco use, advised patient to stop smoking, and reviewed strategies to maximize success       Relevant Orders   VAS US  ABI  WITH/WO TBI   Vitamin D  deficiency   Due for re-eval.      Relevant Orders   VITAMIN D  25 Hydroxy (Vit-D Deficiency, Fractures)   Other Visit Diagnoses       Other fatigue       Relevant Orders   Vitamin B12           Greig Ring, MD

## 2024-04-07 NOTE — Assessment & Plan Note (Signed)
 Acute, possible, slightly diminished pulses bilateral dorsalis pedis and posterior tibialis. Will evaluate ABIs.  Encouraged smoking cessation but she is precontemplative.

## 2024-04-07 NOTE — Assessment & Plan Note (Signed)
 Chronic, seen on August 2025 CT angio abdomen  She is on Crestor  20 mg daily.  We discussed low-cholesterol diet.  Given severity CT angio in 2 years recommended for reevaluation.

## 2024-04-07 NOTE — Assessment & Plan Note (Signed)
 Noted incidentally.. MR unremarkable no further imaging required.

## 2024-04-07 NOTE — Assessment & Plan Note (Signed)
 Chronic, 1 month ago TSH, free T3 and free T4 were in the normal range but here in the hospital  ago TSH was elevated.  If this is inadequately controlled this could be contributing to her blood pressure issues and mood issues. She states she has been compliant throughout with her levothyroxine  175 mcg daily.  She never came  in for repeat TSH, free T3 and free T4 ... Will check today.

## 2024-04-07 NOTE — Assessment & Plan Note (Signed)
 Smoking cessation instruction/counseling given:  counseled patient on the dangers of tobacco use, advised patient to stop smoking, and reviewed strategies to maximize success

## 2024-04-07 NOTE — Patient Instructions (Signed)
 Vit E and biotin combo for nail and hair strength.

## 2024-04-07 NOTE — Assessment & Plan Note (Signed)
 Due for re-eval.

## 2024-04-08 ENCOUNTER — Ambulatory Visit: Payer: Self-pay | Admitting: Family Medicine

## 2024-04-08 LAB — VITAMIN D 25 HYDROXY (VIT D DEFICIENCY, FRACTURES): VITD: 65.52 ng/mL (ref 30.00–100.00)

## 2024-04-08 LAB — TSH: TSH: 0.72 u[IU]/mL (ref 0.35–5.50)

## 2024-04-08 LAB — T4, FREE: Free T4: 1.25 ng/dL (ref 0.60–1.60)

## 2024-04-08 LAB — VITAMIN B12: Vitamin B-12: 215 pg/mL (ref 211–911)

## 2024-04-08 LAB — T3, FREE: T3, Free: 4.1 pg/mL (ref 2.3–4.2)

## 2024-04-08 NOTE — Telephone Encounter (Signed)
 Copied from CRM #[id number]. Topic: Clinical - Lab/Test Results >> Apr 08, 2024 11:09 AM Revonda D wrote: Reason for CRM: Pt was made aware of recent labs results and wants to send a thank you to Dr.Bedsole.

## 2024-04-12 ENCOUNTER — Telehealth: Payer: Self-pay

## 2024-04-12 NOTE — Telephone Encounter (Signed)
 Copied from CRM #[id number]. Topic: Clinical - Medical Advice >> Apr 12, 2024 10:45 AM Berneda FALCON wrote: Reason for CRM: Patient would like to know if she should get the flu shot or not. Wanted to ask the PCP first.  Patient callback is [phone]

## 2024-04-13 ENCOUNTER — Ambulatory Visit: Payer: Medicare Other | Admitting: Family Medicine

## 2024-04-13 NOTE — Telephone Encounter (Signed)
 Ms. Ector notified as instructed by telephone.  Patient agreed.

## 2024-04-18 ENCOUNTER — Other Ambulatory Visit: Payer: Self-pay | Admitting: Family Medicine

## 2024-04-19 ENCOUNTER — Telehealth: Payer: Self-pay | Admitting: Family Medicine

## 2024-04-19 ENCOUNTER — Other Ambulatory Visit: Payer: Self-pay | Admitting: Family Medicine

## 2024-04-19 NOTE — Telephone Encounter (Signed)
 Copied from CRM #[id number]. Topic: Clinical - Medical Advice >> Apr 19, 2024  8:18 AM Revonda D wrote: Reason for CRM: Pt would like to know if she should get a tetanus and pneumococcal shot. Pt stated that the pharmacy informed her that she was overdue but the pt wanted to verify with Dr.Bedsole first. Pt would like a callback with an update.

## 2024-04-19 NOTE — Telephone Encounter (Signed)
 Left message for Virginia Romero letting her know it is okay for her to get a Tetanus and Pneumonia vaccine.  I advised because she has Medicare her Tetanus will need to be done at the pharmacy so she may as well get her Prevnar 20 there as well.  I ask that she call me back with any questions.

## 2024-04-22 LAB — OPHTHALMOLOGY REPORT-SCANNED

## 2024-04-26 ENCOUNTER — Ambulatory Visit (INDEPENDENT_AMBULATORY_CARE_PROVIDER_SITE_OTHER)

## 2024-04-26 VITALS — BP 127/70 | Ht 61.0 in | Wt 188.0 lb

## 2024-04-26 DIAGNOSIS — Z0000 Encounter for general adult medical examination without abnormal findings: Secondary | ICD-10-CM | POA: Diagnosis not present

## 2024-04-26 NOTE — Patient Instructions (Signed)
 Virginia Romero,  Thank you for taking the time for your Medicare Wellness Visit. I appreciate your continued commitment to your health goals. Please review the care plan we discussed, and feel free to reach out if I can assist you further.  Please note that Annual Wellness Visits do not include a physical exam. Some assessments may be limited, especially if the visit was conducted virtually. If needed, we may recommend an in-person follow-up with your provider.  Ongoing Care Seeing your primary care provider every 3 to 6 months helps us  monitor your health and provide consistent, personalized care.   Referrals If a referral was made during today's visit and you haven't received any updates within two weeks, please contact the referred provider directly to check on the status.  Recommended Screenings:  Health Maintenance  Topic Date Due   Osteoporosis screening with Bone Density Scan  09/05/2021   Medicare Annual Wellness Visit  01/08/2024   COVID-19 Vaccine (5 - 2025-26 season) 02/02/2024   Pneumococcal Vaccine for age over 45 (3 of 3 - PCV20 or PCV21) 07/27/2024   Flu Shot  08/31/2024*   Complete foot exam   05/11/2024   Hemoglobin A1C  10/05/2024   Yearly kidney health urinalysis for diabetes  11/19/2024   Breast Cancer Screening  11/27/2024   Yearly kidney function blood test for diabetes  04/07/2025   Eye exam for diabetics  04/22/2025   Colon Cancer Screening  06/17/2026   Hepatitis C Screening  Completed   Zoster (Shingles) Vaccine  Completed   Meningitis B Vaccine  Aged Out   DTaP/Tdap/Td vaccine  Discontinued   Cologuard (Stool DNA test)  Discontinued  *Topic was postponed. The date shown is not the original due date.       04/26/2024    2:43 PM  Advanced Directives  Does Patient Have a Medical Advance Directive? No  Would patient like information on creating a medical advance directive? No - Patient declined    Vision: Annual vision screenings are recommended for  early detection of glaucoma, cataracts, and diabetic retinopathy. These exams can also reveal signs of chronic conditions such as diabetes and high blood pressure.  Dental: Annual dental screenings help detect early signs of oral cancer, gum disease, and other conditions linked to overall health, including heart disease and diabetes.  Please see the attached documents for additional preventive care recommendations.

## 2024-04-27 NOTE — Progress Notes (Signed)
 I connected with  Virginia Romero on 04/26/2024 by a audio enabled telemedicine application and verified that I am speaking with the correct person using two identifiers.  Patient Location: Home  Provider Location: Home Office  Persons Participating in Visit: Patient.  I discussed the limitations of evaluation and management by telemedicine. The patient expressed understanding and agreed to proceed.  Vital Signs: Because this visit was a virtual/telehealth visit, some criteria may be missing or patient reported. Any vitals not documented were not able to be obtained and vitals that have been documented are patient reported.  Chief Complaint  Patient presents with   Medicare Wellness     Subjective:   Virginia Romero is a 68 y.o. female who presents for a Medicare Annual Wellness Visit.  Allergies (verified) Patient has no known allergies.   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   Depression    Diabetes mellitus    Type II   Endometrial polyp    GERD (gastroesophageal reflux disease)    History of ovarian cyst    Hyperlipemia    Hypertension    Osteopenia 09/2016   T score -1.5   Positive colorectal cancer screening using Cologuard test 2022   PTSD (post-traumatic stress disorder)    Shortness of breath dyspnea    with anxiety   Thyroid  disease    Past Surgical History:  Procedure Laterality Date   CESAREAN SECTION  '79 AND '86   X2   COLONOSCOPY     with polypectomy   DIAGNOSTIC LAPAROSCOPY  07/17/2006   WITH LYSIS OF PERITUBULAR AND PERIOVARIAN ADHESIONS, LYSIS OF OMENTAL ADHESIONS, LSO, RIGHT  OVARIAN CYSTECTOMY , HYSTEROSCOPY  WITH EXCISION OF ENDOMETRIAL POLYP...   ORIF HUMERUS FRACTURE Left 03/01/2015   Procedure: OPEN REDUCTION INTERNAL FIXATION (ORIF) PROXIMAL HUMERUS FRACTURE;  Surgeon: Marcey Her,  MD;  Location: [facility] OR;  Service: Orthopedics;  Laterality: Left;   THYROIDECTOMY  2000   TUBAL LIGATION     Family History  Problem Relation Age of Onset   Hypertension Mother    Heart disease Mother    Diabetes Brother    Heart disease Brother    Depression Brother    Depression Daughter    Retinal detachment Daughter    Hypertension Maternal Grandmother    Heart disease Maternal Grandmother    Diabetes Maternal Grandfather    Diabetes Paternal Grandfather    Colon cancer Neg Hx    Colon polyps Neg Hx    Esophageal cancer Neg Hx    Rectal cancer Neg Hx    Stomach cancer Neg Hx    Social History   Occupational History   Not on file  Tobacco Use   Smoking status: Some Days    Current packs/day: 0.50    Average packs/day: 0.5 packs/day for 30.0 years (15.0 ttl pk-yrs)    Types: Cigarettes   Smokeless tobacco: Never  Vaping Use   Vaping status: Never Used  Substance and Sexual Activity   Alcohol use: No   Drug use: No   Sexual activity: Never    Comment: 1 partner in last year   Tobacco Counseling Ready to quit: Not Answered Counseling given: Not Answered  SDOH Screenings   Food Insecurity: No Food Insecurity (04/26/2024)  Housing: Low  Risk  (04/26/2024)  Transportation Needs: No Transportation Needs (04/26/2024)  Utilities: Not At Risk (04/26/2024)  Alcohol Screen: Low Risk  (01/08/2023)  Depression (PHQ2-9): Low Risk  (04/26/2024)  Recent Concern: Depression (PHQ2-9) - High Risk (04/07/2024)  Financial Resource Strain: Low Risk  (01/08/2023)  Physical Activity: Sufficiently Active (04/26/2024)  Social Connections: Socially Isolated (04/26/2024)  Stress: No Stress Concern Present (04/26/2024)  Tobacco Use: High Risk (04/26/2024)  Health Literacy: Adequate Health Literacy (04/26/2024)   See flowsheets for full screening details  Depression Screen PHQ 2 & 9 Depression Scale- Over the past 2 weeks, how often have you been bothered by any of the following  problems? Little interest or pleasure in doing things: 1 Feeling down, depressed, or hopeless (PHQ Adolescent also includes...irritable): 1 PHQ-2 Total Score: 2 Trouble falling or staying asleep, or sleeping too much: 0 Feeling tired or having little energy: 0 Poor appetite or overeating (PHQ Adolescent also includes...weight loss): 0 Feeling bad about yourself - or that you are a failure or have let yourself or your family down: 2 Trouble concentrating on things, such as reading the newspaper or watching television (PHQ Adolescent also includes...like school work): 0 Moving or speaking so slowly that other people could have noticed. Or the opposite - being so fidgety or restless that you have been moving around a lot more than usual: 0 Thoughts that you would be better off dead, or of hurting yourself in some way: 0 PHQ-9 Total Score: 4 If you checked off any problems, how difficult have these problems made it for you to do your work, take care of things at home, or get along with other people?: Somewhat difficult  Depression Treatment Depression Interventions/Treatment : EYV7-0 Score <4 Follow-up Not Indicated     Goals Addressed             This Visit's Progress    Patient Stated   On track    Lose weight and quit smoking       Visit info / Clinical Intake: Medicare Wellness Visit Type:: Subsequent Annual Wellness Visit Persons participating in visit:: patient Medicare Wellness Visit Mode:: Telephone If telephone:: video declined Because this visit was a virtual/telehealth visit:: pt reported vitals If Telephone or Video please confirm:: I connected with the patient using audio enabled telemedicine application and verified that I am speaking with the correct person using two identifiers; I discussed the limitations of evaluation and management by telemedicine; The patient expressed understanding and agreed to proceed Patient Location:: home Provider Location:: home  office Information given by:: patient Interpreter Needed?: No Pre-visit prep was completed: yes AWV questionnaire completed by patient prior to visit?: no Living arrangements:: (!) lives alone Patient's Overall Health Status Rating: very good Typical amount of pain: none Does pain affect daily life?: no Are you currently prescribed opioids?: no  Dietary Habits and Nutritional Risks How many meals a day?: 3 Eats fruit and vegetables daily?: yes Most meals are obtained by: preparing own meals In the last 2 weeks, have you had any of the following?: none Diabetic:: no  Functional Status Activities of Daily Living (to include ambulation/medication): Independent Ambulation: Independent with device- listed below Home Assistive Devices/Equipment: Cane Medication Administration: Independent Home Management: Independent Manage your own finances?: yes Primary transportation is: driving Concerns about vision?: no *vision screening is required for WTM* Concerns about hearing?: no  Fall Screening Falls in the past year?: 0 Number of falls in past year: 0 Was there an injury with Fall?: 0 Fall  Risk Category Calculator: 0 Patient Fall Risk Level: Low Fall Risk  Fall Risk Patient at Risk for Falls Due to: No Fall Risks; Impaired mobility Fall risk Follow up: Falls evaluation completed; Falls prevention discussed  Home and Transportation Safety: All rugs have non-skid backing?: yes All stairs or steps have railings?: yes Grab bars in the bathtub or shower?: yes Have non-skid surface in bathtub or shower?: yes Good home lighting?: yes Regular seat belt use?: yes Hospital stays in the last year:: (!) yes How many hospital stays:: 1 Reason: Elevated BP  Cognitive Assessment Difficulty concentrating, remembering, or making decisions? : no Will 6CIT or Mini Cog be Completed: no 6CIT or Mini Cog Declined: patient declined  Advance Directives (For Healthcare) Does Patient Have a  Medical Advance Directive?: No Would patient like information on creating a medical advance directive?: No - Patient declined  Reviewed/Updated  Reviewed/Updated: Reviewed All (Medical, Surgical, Family, Medications, Allergies, Care Teams, Patient Goals)        Objective:    Today's Vitals   04/26/24 1440  BP: 127/70  Weight: 188 lb (85.3 kg)  Height: 5' 1 (1.549 m)   Body mass index is 35.52 kg/m.  Current Medications (verified) Outpatient Encounter Medications as of 04/26/2024  Medication Sig   acetaminophen  (TYLENOL ) 325 MG tablet Take 650 mg by mouth every 6 (six) hours as needed for mild pain (pain score 1-3).   ALPRAZolam  (XANAX ) 1 MG tablet TAKE 1 TABLET BY MOUTH THREE TIMES DAILY AS NEEDED FOR ANXIETY   amLODipine  (NORVASC ) 5 MG tablet Take 1 tablet (5 mg total) by mouth daily.   carvedilol  (COREG ) 25 MG tablet Take 1 tablet (25 mg total) by mouth 2 (two) times daily with a meal.   escitalopram  (LEXAPRO ) 10 MG tablet TAKE 1 TABLET BY MOUTH AT BEDTIME   hydrochlorothiazide  (HYDRODIURIL ) 25 MG tablet Take 1 tablet by mouth once daily   levothyroxine  (SYNTHROID ) 175 MCG tablet Take 1 tablet by mouth once daily   losartan  (COZAAR ) 100 MG tablet Take 1 tablet (100 mg total) by mouth daily.   metFORMIN  (GLUCOPHAGE ) 500 MG tablet TAKE 2 TABLETS BY MOUTH TWICE DAILY WITH A MEAL   rosuvastatin  (CRESTOR ) 20 MG tablet Take 1 tablet (20 mg total) by mouth daily.   Accu-Chek Softclix Lancets lancets Use to check blood sugar daily (Patient not taking: Reported on 04/26/2024)   Blood Glucose Calibration (ACCU-CHEK GUIDE CONTROL) LIQD Use to check controls on glucose meter (Patient not taking: Reported on 04/26/2024)   Blood Glucose Monitoring Suppl (ACCU-CHEK GUIDE) w/Device KIT Use to check blood sugar daily (Patient not taking: Reported on 04/26/2024)   glucose blood (ACCU-CHEK GUIDE TEST) test strip Use to check blood sugar daily (Patient not taking: Reported on 04/26/2024)    Vitamin D , Ergocalciferol , (DRISDOL ) 1.25 MG (50000 UNIT) CAPS capsule Take 1 capsule (50,000 Units total) by mouth once a week. (Patient not taking: Reported on 04/26/2024)   No facility-administered encounter medications on file as of 04/26/2024.   Hearing/Vision screen Hearing Screening - Comments:: No hearing issues  Vision Screening - Comments:: Patient wears contacts  Immunizations and Health Maintenance Health Maintenance  Topic Date Due   Bone Density Scan  09/05/2021   COVID-19 Vaccine (5 - 2025-26 season) 02/02/2024   Pneumococcal Vaccine: 50+ Years (3 of 3 - PCV20 or PCV21) 07/27/2024   Influenza Vaccine  08/31/2024 (Originally 01/02/2024)   FOOT EXAM  05/11/2024   HEMOGLOBIN A1C  10/05/2024   Diabetic kidney evaluation - Urine  ACR  11/19/2024   Mammogram  11/27/2024   Diabetic kidney evaluation - eGFR measurement  04/07/2025   OPHTHALMOLOGY EXAM  04/22/2025   Medicare Annual Wellness (AWV)  04/26/2025   Colonoscopy  06/17/2026   Hepatitis C Screening  Completed   Zoster Vaccines- Shingrix  Completed   Meningococcal B Vaccine  Aged Out   DTaP/Tdap/Td  Discontinued   Fecal DNA (Cologuard)  Discontinued        Assessment/Plan:  This is a routine wellness examination for [place].  Patient Care Team: Avelina Greig BRAVO, MD as PCP - General (Family Medicine) Neysa Inocente PARAS, NP (Inactive) as Nurse Practitioner (Obstetrics and Gynecology)  I have personally reviewed and noted the following in the patient's chart:   Medical and social history Use of alcohol, tobacco or illicit drugs  Current medications and supplements including opioid prescriptions. Functional ability and status Nutritional status Physical activity Advanced directives List of other physicians Hospitalizations, surgeries, and ER visits in previous 12 months Vitals Screenings to include cognitive, depression, and falls Referrals and appointments  No orders of the defined types were placed in this  encounter.  In addition, I have reviewed and discussed with patient certain preventive protocols, quality metrics, and best practice recommendations. A written personalized care plan for preventive services as well as general preventive health recommendations were provided to patient.   Lyle MARLA Right, CMA   04/26/2024  Return in 1 year (on 04/26/2025).  After Visit Summary: (MyChart) Due to this being a telephonic visit, the after visit summary with patients personalized plan was offered to patient via MyChart   Nurse Notes: Nothing to report

## 2024-04-30 ENCOUNTER — Other Ambulatory Visit: Payer: Self-pay | Admitting: Family Medicine

## 2024-04-30 DIAGNOSIS — F32A Depression, unspecified: Secondary | ICD-10-CM

## 2024-04-30 DIAGNOSIS — F5104 Psychophysiologic insomnia: Secondary | ICD-10-CM

## 2024-05-03 NOTE — Telephone Encounter (Signed)
 Last office visit 04/07/2024 for BP concerns.  Last refilled 03/16/2024 for #90 with no refills.  Next Appt: CPE 10/05/24.

## 2024-05-04 ENCOUNTER — Other Ambulatory Visit: Payer: Self-pay

## 2024-05-04 ENCOUNTER — Encounter: Payer: Self-pay | Admitting: Pharmacist

## 2024-05-04 DIAGNOSIS — E1169 Type 2 diabetes mellitus with other specified complication: Secondary | ICD-10-CM

## 2024-05-04 MED ORDER — METFORMIN HCL 500 MG PO TABS: 500.0000 mg | ORAL_TABLET | Freq: Two times a day (BID) | ORAL | Status: AC

## 2024-05-04 NOTE — Progress Notes (Signed)
 Pharmacy Quality Measure Review  This patient is appearing on a report for being at risk of failing the adherence measure for diabetes medications this calendar year.   Medication: metformin  500 mg Last fill date: 10/26 for 15 day supply  Per chart notes from provider and CMA, metformin  dose was reduced to 1 tab twice daily in place of 2 tabs twice daily due to c/f side effects. Current prescription still written for 4 tablets daily.   Message sent to PCP for consideration of medication refill with updated instructions as to avoid failing measure for 2026.

## 2024-05-04 NOTE — Addendum Note (Signed)
 Addended by: AVELINA NO E on: 05/04/2024 02:16 PM   Modules accepted: Orders

## 2024-05-04 NOTE — Progress Notes (Signed)
 Pharmacy Quality Measure Review  This patient is appearing on a report for being at risk of failing the adherence measure for diabetes medications this calendar year.   Medication: metformin  500 mg  Last fill date: 03/28/24 for 15 day supply  Spoke with Sadee over the phone today in regards to medication management. She states that she has decreased her metformin  dose to one tablet twice daily. The original prescription was written for two tablets twice daily. She reports this change occurred soon after her hospitalization in July. She has half of a bottle of medication remaining. The active prescription does not reflect the current dose. Will route encounter to embedded pharmacist and PCP to close the loop. Will likely need a new Rx to reflect the new dosage, if deemed appropriate by the provider.   Woodie Jock, PharmD PGY1 Pharmacy Resident  05/04/2024

## 2024-06-09 ENCOUNTER — Other Ambulatory Visit: Payer: Self-pay | Admitting: Family Medicine

## 2024-06-09 DIAGNOSIS — F419 Anxiety disorder, unspecified: Secondary | ICD-10-CM

## 2024-06-09 DIAGNOSIS — F5104 Psychophysiologic insomnia: Secondary | ICD-10-CM

## 2024-06-09 NOTE — Telephone Encounter (Signed)
 Last office visit 04/07/2024 for BP concerns/check up.  Last office visit 05/04/2024 for #90 with no refills. Next appt: CPE 10/26/2024

## 2024-06-15 ENCOUNTER — Other Ambulatory Visit: Payer: Self-pay | Admitting: Family Medicine

## 2024-06-15 NOTE — Telephone Encounter (Signed)
 Last office visit 04/07/2024 for BP concerns.  Last refilled 12/30/2023 for #12 with no refills. Vit D level 04/07/24 which was normal at 65.52 ng/mL.  Next Appt: CPE 10/05/24

## 2024-06-22 ENCOUNTER — Ambulatory Visit: Payer: Self-pay

## 2024-06-22 MED ORDER — BENZONATATE 200 MG PO CAPS: 200.0000 mg | ORAL_CAPSULE | Freq: Three times a day (TID) | ORAL | 0 refills | Status: DC | PRN

## 2024-06-22 NOTE — Telephone Encounter (Signed)
Virginia Romero notified as instructed by telephone.  Patient states understanding.

## 2024-06-22 NOTE — Telephone Encounter (Signed)
 After reviewing the information looks like she has been having her symptoms for 2 days clear productive cough, no shortness of breath, mild associated wheezing.  No fever.  Symptoms sound like a viral upper respiratory tract infection.  She can do a home COVID/flu test to rule these out. I will send in a cough suppressant for her to use prn. Can start Mucinex DM twice daily as well as flonase 2 sprays per nostril daily.  If symptoms are not improving after 7 to 10 days, new fever late in illness, or change in color of mucus ( current smoker, no dx of COPD) she needs to make an appointment for an in office evaluation.SABRA if unable make virtual visit or let me know.

## 2024-06-22 NOTE — Telephone Encounter (Signed)
 FYI Only or Action Required?: Action required by provider: clinical question for provider.  Patient was last seen in primary care on 04/07/2024 by Virginia Greig BRAVO, MD.  Called Nurse Triage reporting Cough and Nasal Congestion.  Symptoms began several days ago.  Interventions attempted: Rest, hydration, or home remedies.  Symptoms are: unchanged.  Triage Disposition: See HCP Within 4 Hours (Or PCP Triage)  Patient/caregiver understands and will follow disposition?: No, wishes to speak with PCP  Reason for Disposition  Wheezing is present  Answer Assessment - Initial Assessment Questions Patient states that she started to experience cough and congestion a couple days ago. She denies any SOB or chest pain. She does report some mild wheezing. Office visit advised. Patient refuses stating she does not have transportation to come into the office. Requesting antibiotic.   1. ONSET: When did the cough begin?      A couple days ago  2. SEVERITY: How bad is the cough today?      Moderate  3. SPUTUM: Describe the color of your sputum (e.g., none, dry cough; clear, white, yellow, green)     Clear  4. HEMOPTYSIS: Are you coughing up any blood? If Yes, ask: How much? (e.g., flecks, streaks, tablespoons, etc.)     No  5. DIFFICULTY BREATHING: Are you having difficulty breathing? If Yes, ask: How bad is it? (e.g., mild, moderate, severe)      Denies SOB  6. FEVER: Do you have a fever? If Yes, ask: What is your temperature, how was it measured, and when did it start?     No  7. CARDIAC HISTORY: Do you have any history of heart disease? (e.g., heart attack, congestive heart failure)      No  8. LUNG HISTORY: Do you have any history of lung disease?  (e.g., pulmonary embolus, asthma, emphysema)     No  9. PE RISK FACTORS: Do you have a history of blood clots? (or: recent major surgery, recent prolonged travel, bedridden)     No  10. OTHER SYMPTOMS: Do you have any  other symptoms? (e.g., runny nose, wheezing, chest pain)       Mild wheezing  11. PREGNANCY: Is there any chance you are pregnant? When was your last menstrual period?       NA  12. TRAVEL: Have you traveled out of the country in the last month? (e.g., travel history, exposures)       No  Protocols used: Cough - Acute Productive-A-AH  Reason for Triage: Patient is coughing up mucus and wheezing

## 2024-06-23 ENCOUNTER — Other Ambulatory Visit: Payer: Self-pay | Admitting: Family Medicine

## 2024-06-23 MED ORDER — BENZONATATE 200 MG PO CAPS: 200.0000 mg | ORAL_CAPSULE | Freq: Three times a day (TID) | ORAL | 0 refills | Status: AC | PRN

## 2024-06-23 NOTE — Telephone Encounter (Unsigned)
 Copied from CRM #[id number]. Topic: Clinical - Prescription Issue >> Jun 23, 2024  9:46 AM Terri MATSU wrote: Reason for CRM: Patient stated Dr.Bedsole was supposed to call in some Mucinex DM for her yesterday to [facility] but when she called [facility] they stated they haven't received anything.

## 2024-06-23 NOTE — Telephone Encounter (Signed)
 Left message for Ms. Ephraim that the Flonase nasal spray and the Mucinex DM are OTC medications. The Benzonate  cough suppressant was set on phone in so I have resent that today electronically.

## 2024-07-06 ENCOUNTER — Other Ambulatory Visit: Payer: Self-pay | Admitting: Family Medicine

## 2024-07-06 DIAGNOSIS — F5104 Psychophysiologic insomnia: Secondary | ICD-10-CM

## 2024-07-06 DIAGNOSIS — F32A Depression, unspecified: Secondary | ICD-10-CM

## 2024-07-06 NOTE — Telephone Encounter (Signed)
 Last office visit 04/07/2024 for BP concerns.  Last refilled 06/09/2024 for #90 with no refills. Next appt: CPE 10/05/24.

## 2024-07-20 ENCOUNTER — Ambulatory Visit (HOSPITAL_COMMUNITY)

## 2024-09-28 ENCOUNTER — Other Ambulatory Visit

## 2024-10-05 ENCOUNTER — Encounter: Admitting: Family Medicine
